# Patient Record
Sex: Male | Born: 1940
Health system: Southern US, Community
[De-identification: ages and names within clinical notes are randomized; demographics above are authoritative.]

## PROBLEM LIST (undated history)

## (undated) DIAGNOSIS — K5730 Diverticulosis of large intestine without perforation or abscess without bleeding: Secondary | ICD-10-CM

## (undated) DIAGNOSIS — R361 Hematospermia: Secondary | ICD-10-CM

## (undated) DIAGNOSIS — E785 Hyperlipidemia, unspecified: Secondary | ICD-10-CM

## (undated) DIAGNOSIS — Z86010 Personal history of colonic polyps: Secondary | ICD-10-CM

## (undated) DIAGNOSIS — R55 Syncope and collapse: Secondary | ICD-10-CM

## (undated) DIAGNOSIS — N5201 Erectile dysfunction due to arterial insufficiency: Secondary | ICD-10-CM

## (undated) DIAGNOSIS — I10 Essential (primary) hypertension: Secondary | ICD-10-CM

## (undated) HISTORY — DX: Diverticulosis of large intestine without perforation or abscess without bleeding: K57.30

## (undated) HISTORY — PX: COLONOSCOPY W/ POLYPECTOMY: SHX1380

## (undated) HISTORY — DX: Hyperlipidemia, unspecified: E78.5

## (undated) HISTORY — DX: Essential (primary) hypertension: I10

## (undated) HISTORY — DX: Hematospermia: R36.1

## (undated) HISTORY — DX: Erectile dysfunction due to arterial insufficiency: N52.01

## (undated) HISTORY — DX: Personal history of colonic polyps: Z86.010

## (undated) HISTORY — DX: Syncope and collapse: R55

---

## 1962-07-12 HISTORY — PX: MANDIBLE FRACTURE SURGERY: SHX706

## 1990-07-12 HISTORY — PX: APPENDECTOMY: SHX54

## 2000-07-12 DIAGNOSIS — R55 Syncope and collapse: Secondary | ICD-10-CM

## 2000-07-12 HISTORY — DX: Syncope and collapse: R55

## 2000-12-08 ENCOUNTER — Encounter: Payer: Self-pay | Admitting: Emergency Medicine

## 2000-12-08 ENCOUNTER — Emergency Department (HOSPITAL_COMMUNITY): Admission: EM | Admit: 2000-12-08 | Discharge: 2000-12-08 | Payer: Self-pay | Admitting: Emergency Medicine

## 2001-05-14 ENCOUNTER — Emergency Department (HOSPITAL_COMMUNITY): Admission: EM | Admit: 2001-05-14 | Discharge: 2001-05-14 | Payer: Self-pay

## 2002-12-02 ENCOUNTER — Emergency Department (HOSPITAL_COMMUNITY): Admission: EM | Admit: 2002-12-02 | Discharge: 2002-12-02 | Payer: Self-pay | Admitting: Emergency Medicine

## 2002-12-02 ENCOUNTER — Encounter: Payer: Self-pay | Admitting: Emergency Medicine

## 2004-01-15 ENCOUNTER — Encounter: Payer: Self-pay | Admitting: Internal Medicine

## 2004-07-30 ENCOUNTER — Ambulatory Visit: Payer: Self-pay | Admitting: Internal Medicine

## 2004-09-17 ENCOUNTER — Ambulatory Visit: Payer: Self-pay | Admitting: Internal Medicine

## 2004-09-24 ENCOUNTER — Ambulatory Visit: Payer: Self-pay | Admitting: Internal Medicine

## 2004-10-02 ENCOUNTER — Ambulatory Visit: Payer: Self-pay | Admitting: Internal Medicine

## 2004-12-11 ENCOUNTER — Ambulatory Visit: Payer: Self-pay | Admitting: Internal Medicine

## 2005-04-23 ENCOUNTER — Ambulatory Visit: Payer: Self-pay | Admitting: Internal Medicine

## 2005-04-30 ENCOUNTER — Ambulatory Visit: Payer: Self-pay | Admitting: Internal Medicine

## 2005-07-12 DIAGNOSIS — Z860100 Personal history of colon polyps, unspecified: Secondary | ICD-10-CM

## 2005-07-12 HISTORY — DX: Personal history of colonic polyps: Z86.010

## 2005-07-12 HISTORY — DX: Personal history of colon polyps, unspecified: Z86.0100

## 2005-10-22 ENCOUNTER — Ambulatory Visit: Payer: Self-pay | Admitting: Internal Medicine

## 2005-11-08 ENCOUNTER — Ambulatory Visit: Payer: Self-pay | Admitting: Internal Medicine

## 2005-12-01 ENCOUNTER — Ambulatory Visit: Payer: Self-pay | Admitting: Internal Medicine

## 2005-12-02 ENCOUNTER — Ambulatory Visit: Payer: Self-pay | Admitting: Internal Medicine

## 2005-12-02 ENCOUNTER — Ambulatory Visit: Payer: Self-pay | Admitting: Cardiology

## 2006-01-14 ENCOUNTER — Ambulatory Visit: Payer: Self-pay | Admitting: Internal Medicine

## 2006-01-21 ENCOUNTER — Encounter (INDEPENDENT_AMBULATORY_CARE_PROVIDER_SITE_OTHER): Payer: Self-pay | Admitting: *Deleted

## 2006-01-21 ENCOUNTER — Ambulatory Visit: Payer: Self-pay | Admitting: Internal Medicine

## 2006-03-10 ENCOUNTER — Ambulatory Visit: Payer: Self-pay | Admitting: Internal Medicine

## 2006-06-16 ENCOUNTER — Ambulatory Visit: Payer: Self-pay | Admitting: Internal Medicine

## 2006-07-21 ENCOUNTER — Ambulatory Visit: Payer: Self-pay | Admitting: Internal Medicine

## 2006-10-20 ENCOUNTER — Ambulatory Visit: Payer: Self-pay | Admitting: Internal Medicine

## 2006-10-24 ENCOUNTER — Ambulatory Visit: Payer: Self-pay | Admitting: Internal Medicine

## 2006-10-24 LAB — CONVERTED CEMR LAB
HDL: 37.2 mg/dL — ABNORMAL LOW (ref >39.0)
LDL Cholesterol: 118 mg/dL — ABNORMAL HIGH (ref 0–99)
Triglycerides: 176 mg/dL — ABNORMAL HIGH (ref 0–149)
VLDL: 35 mg/dL (ref 0–40)

## 2007-01-31 ENCOUNTER — Telehealth (INDEPENDENT_AMBULATORY_CARE_PROVIDER_SITE_OTHER): Payer: Self-pay | Admitting: *Deleted

## 2007-02-25 ENCOUNTER — Emergency Department (HOSPITAL_COMMUNITY): Admission: EM | Admit: 2007-02-25 | Discharge: 2007-02-25 | Payer: Self-pay | Admitting: Emergency Medicine

## 2007-04-17 ENCOUNTER — Ambulatory Visit: Payer: Self-pay | Admitting: Internal Medicine

## 2007-04-21 ENCOUNTER — Encounter (INDEPENDENT_AMBULATORY_CARE_PROVIDER_SITE_OTHER): Payer: Self-pay | Admitting: *Deleted

## 2007-04-21 LAB — CONVERTED CEMR LAB
Hgb A1c MFr Bld: 5.8 % (ref 4.6–6.0)
Total CHOL/HDL Ratio: 6.2
Triglycerides: 158 mg/dL — ABNORMAL HIGH (ref 0–149)
VLDL: 32 mg/dL (ref 0–40)

## 2007-05-01 ENCOUNTER — Ambulatory Visit: Payer: Self-pay | Admitting: Internal Medicine

## 2007-06-12 ENCOUNTER — Ambulatory Visit: Payer: Self-pay | Admitting: Internal Medicine

## 2007-06-12 DIAGNOSIS — M7730 Calcaneal spur, unspecified foot: Secondary | ICD-10-CM | POA: Insufficient documentation

## 2007-07-13 HISTORY — PX: INGUINAL HERNIA REPAIR: SUR1180

## 2007-10-30 ENCOUNTER — Ambulatory Visit: Payer: Self-pay | Admitting: Internal Medicine

## 2007-10-30 DIAGNOSIS — T887XXA Unspecified adverse effect of drug or medicament, initial encounter: Secondary | ICD-10-CM | POA: Insufficient documentation

## 2007-11-05 ENCOUNTER — Encounter (INDEPENDENT_AMBULATORY_CARE_PROVIDER_SITE_OTHER): Payer: Self-pay | Admitting: *Deleted

## 2007-11-05 LAB — CONVERTED CEMR LAB
Albumin: 4 g/dL (ref 3.5–5.2)
Cholesterol: 184 mg/dL (ref 0–200)
HDL: 36.1 mg/dL — ABNORMAL LOW (ref >39.0)
LDL Cholesterol: 108 mg/dL — ABNORMAL HIGH (ref 0–99)
Total Protein: 7.2 g/dL (ref 6.0–8.3)
Triglycerides: 199 mg/dL — ABNORMAL HIGH (ref 0–149)
VLDL: 40 mg/dL (ref 0–40)

## 2007-12-12 ENCOUNTER — Telehealth (INDEPENDENT_AMBULATORY_CARE_PROVIDER_SITE_OTHER): Payer: Self-pay | Admitting: *Deleted

## 2007-12-13 ENCOUNTER — Telehealth (INDEPENDENT_AMBULATORY_CARE_PROVIDER_SITE_OTHER): Payer: Self-pay | Admitting: *Deleted

## 2008-03-20 ENCOUNTER — Telehealth (INDEPENDENT_AMBULATORY_CARE_PROVIDER_SITE_OTHER): Payer: Self-pay | Admitting: *Deleted

## 2008-04-03 ENCOUNTER — Telehealth (INDEPENDENT_AMBULATORY_CARE_PROVIDER_SITE_OTHER): Payer: Self-pay | Admitting: *Deleted

## 2008-04-04 ENCOUNTER — Ambulatory Visit: Payer: Self-pay | Admitting: Internal Medicine

## 2008-04-04 DIAGNOSIS — Z8719 Personal history of other diseases of the digestive system: Secondary | ICD-10-CM

## 2008-04-04 DIAGNOSIS — I10 Essential (primary) hypertension: Secondary | ICD-10-CM | POA: Insufficient documentation

## 2008-04-04 DIAGNOSIS — R739 Hyperglycemia, unspecified: Secondary | ICD-10-CM | POA: Insufficient documentation

## 2008-04-08 ENCOUNTER — Encounter (INDEPENDENT_AMBULATORY_CARE_PROVIDER_SITE_OTHER): Payer: Self-pay | Admitting: *Deleted

## 2008-04-08 ENCOUNTER — Telehealth (INDEPENDENT_AMBULATORY_CARE_PROVIDER_SITE_OTHER): Payer: Self-pay | Admitting: *Deleted

## 2008-04-09 ENCOUNTER — Encounter (INDEPENDENT_AMBULATORY_CARE_PROVIDER_SITE_OTHER): Payer: Self-pay | Admitting: *Deleted

## 2008-04-15 ENCOUNTER — Ambulatory Visit: Payer: Self-pay | Admitting: Internal Medicine

## 2008-04-16 ENCOUNTER — Ambulatory Visit: Payer: Self-pay | Admitting: Internal Medicine

## 2008-04-23 ENCOUNTER — Encounter: Payer: Self-pay | Admitting: Internal Medicine

## 2008-04-23 ENCOUNTER — Encounter (INDEPENDENT_AMBULATORY_CARE_PROVIDER_SITE_OTHER): Payer: Self-pay | Admitting: *Deleted

## 2008-04-23 LAB — CONVERTED CEMR LAB
AST: 28 units/L (ref 0–37)
Cholesterol: 151 mg/dL (ref 0–200)
HDL: 36.5 mg/dL — ABNORMAL LOW (ref >39.0)
Hgb A1c MFr Bld: 5.6 % (ref 4.6–6.0)
LDL Cholesterol: 98 mg/dL (ref 0–99)
VLDL: 17 mg/dL (ref 0–40)

## 2008-05-10 ENCOUNTER — Telehealth (INDEPENDENT_AMBULATORY_CARE_PROVIDER_SITE_OTHER): Payer: Self-pay | Admitting: *Deleted

## 2008-05-16 ENCOUNTER — Ambulatory Visit (HOSPITAL_BASED_OUTPATIENT_CLINIC_OR_DEPARTMENT_OTHER): Admission: RE | Admit: 2008-05-16 | Discharge: 2008-05-16 | Payer: Self-pay | Admitting: General Surgery

## 2008-07-12 ENCOUNTER — Emergency Department (HOSPITAL_COMMUNITY): Admission: AC | Admit: 2008-07-12 | Discharge: 2008-07-12 | Payer: Self-pay

## 2008-07-12 ENCOUNTER — Telehealth: Payer: Self-pay | Admitting: Internal Medicine

## 2008-07-15 ENCOUNTER — Ambulatory Visit: Payer: Self-pay | Admitting: Internal Medicine

## 2008-08-28 ENCOUNTER — Ambulatory Visit: Payer: Self-pay | Admitting: Internal Medicine

## 2008-11-04 ENCOUNTER — Ambulatory Visit: Payer: Self-pay | Admitting: Internal Medicine

## 2008-11-04 DIAGNOSIS — M2550 Pain in unspecified joint: Secondary | ICD-10-CM

## 2009-04-15 ENCOUNTER — Ambulatory Visit: Payer: Self-pay | Admitting: Internal Medicine

## 2009-04-15 DIAGNOSIS — Z860100 Personal history of colon polyps, unspecified: Secondary | ICD-10-CM | POA: Insufficient documentation

## 2009-04-15 DIAGNOSIS — E785 Hyperlipidemia, unspecified: Secondary | ICD-10-CM

## 2009-04-15 IMAGING — CR DG CHEST 1V PORT
1 series · 1 of 1 positions shown · non-contrast
Comparison: None available

CLINICAL DATA: Fell

PORTABLE CHEST - 1 VIEW

[AP]
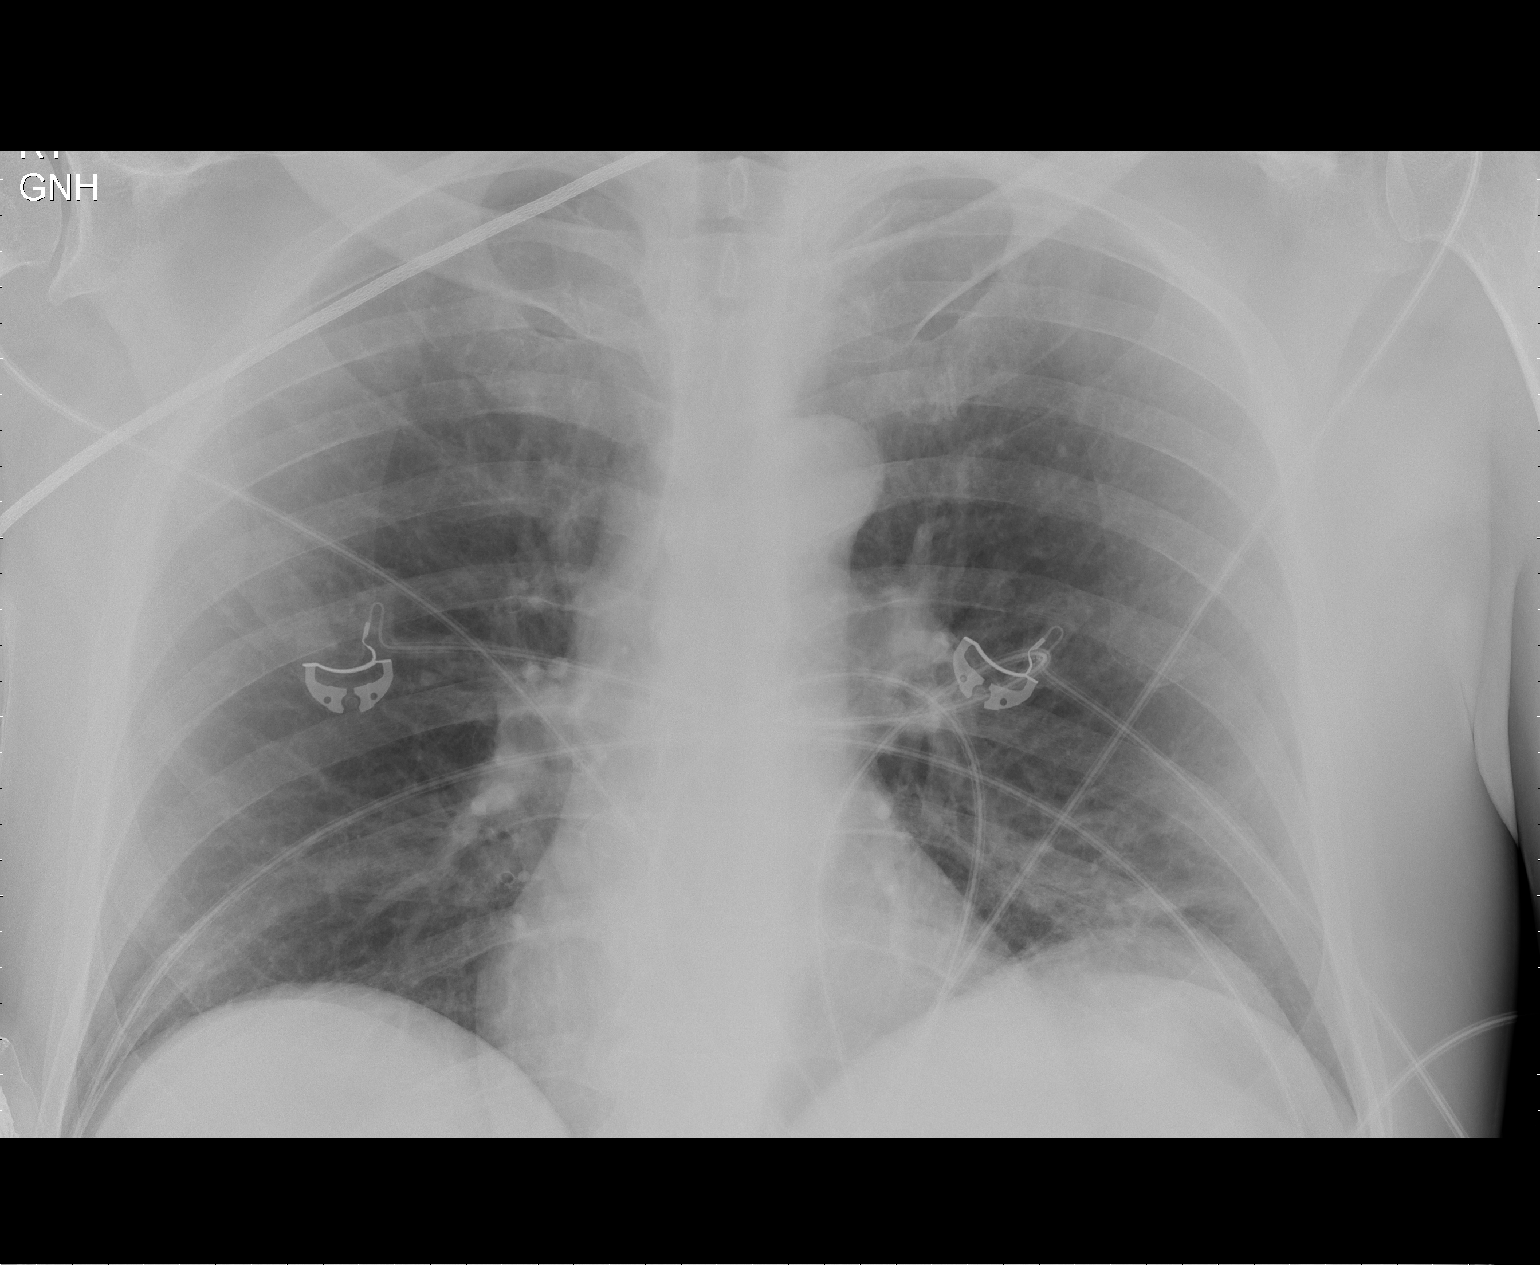

[1 of 1 positions shown; findings below may reference images not displayed]

FINDINGS: There is elevation of the left diaphragmatic leaflet.
There is some linear atelectasis or scarring at the left lung base.
Right lung clear.  No pneumothorax or effusion.  Mediastinal
contours normal.  Visualized bones unremarkable.
IMPRESSION: 1.  Mild elevation of the left diaphragmatic leaflet with some
linear atelectasis or scarring at the left lung base.
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
## 2009-04-21 ENCOUNTER — Ambulatory Visit: Payer: Self-pay | Admitting: Internal Medicine

## 2009-04-23 LAB — CONVERTED CEMR LAB
AST: 27 units/L (ref 0–37)
Albumin: 4.4 g/dL (ref 3.5–5.2)
Alkaline Phosphatase: 61 units/L (ref 39–117)
Basophils Absolute: 0 10*3/uL (ref 0.0–0.1)
Bilirubin, Direct: 0.1 mg/dL (ref 0.0–0.3)
Eosinophils Relative: 1.3 % (ref 0.0–5.0)
HDL: 34.2 mg/dL — ABNORMAL LOW (ref >39.00)
Hgb A1c MFr Bld: 5.7 % (ref 4.6–6.5)
Lymphocytes Relative: 28.3 % (ref 12.0–46.0)
Monocytes Relative: 6.9 % (ref 3.0–12.0)
PSA: 1.77 ng/mL (ref 0.10–4.00)
Platelets: 184 10*3/uL (ref 150.0–400.0)
Potassium: 4.4 meq/L (ref 3.5–5.1)
RDW: 12.2 % (ref 11.5–14.6)
TSH: 2.21 microintl units/mL (ref 0.35–5.50)
Total CHOL/HDL Ratio: 5
Total Protein: 7.2 g/dL (ref 6.0–8.3)
VLDL: 28.6 mg/dL (ref 0.0–40.0)
WBC: 5.7 10*3/uL (ref 4.5–10.5)

## 2009-04-24 ENCOUNTER — Encounter (INDEPENDENT_AMBULATORY_CARE_PROVIDER_SITE_OTHER): Payer: Self-pay | Admitting: *Deleted

## 2009-11-17 ENCOUNTER — Telehealth (INDEPENDENT_AMBULATORY_CARE_PROVIDER_SITE_OTHER): Payer: Self-pay | Admitting: *Deleted

## 2009-11-24 ENCOUNTER — Ambulatory Visit: Payer: Self-pay | Admitting: Internal Medicine

## 2010-01-19 ENCOUNTER — Ambulatory Visit: Payer: Self-pay | Admitting: Internal Medicine

## 2010-01-19 DIAGNOSIS — M545 Low back pain, unspecified: Secondary | ICD-10-CM | POA: Insufficient documentation

## 2010-01-19 LAB — CONVERTED CEMR LAB
Bilirubin Urine: NEGATIVE
Glucose, Urine, Semiquant: NEGATIVE
Ketones, urine, test strip: NEGATIVE
Specific Gravity, Urine: <1.005
pH: 6

## 2010-01-22 ENCOUNTER — Encounter: Payer: Self-pay | Admitting: Internal Medicine

## 2010-01-30 ENCOUNTER — Ambulatory Visit: Payer: Self-pay | Admitting: Internal Medicine

## 2010-01-30 DIAGNOSIS — E876 Hypokalemia: Secondary | ICD-10-CM

## 2010-02-02 ENCOUNTER — Telehealth (INDEPENDENT_AMBULATORY_CARE_PROVIDER_SITE_OTHER): Payer: Self-pay | Admitting: *Deleted

## 2010-02-02 LAB — CONVERTED CEMR LAB: Potassium: 4 meq/L (ref 3.5–5.3)

## 2010-03-26 ENCOUNTER — Telehealth (INDEPENDENT_AMBULATORY_CARE_PROVIDER_SITE_OTHER): Payer: Self-pay | Admitting: *Deleted

## 2010-05-11 ENCOUNTER — Ambulatory Visit: Payer: Self-pay | Admitting: Internal Medicine

## 2010-05-18 LAB — CONVERTED CEMR LAB
Albumin: 4 g/dL (ref 3.5–5.2)
Alkaline Phosphatase: 73 units/L (ref 39–117)
BUN: 14 mg/dL (ref 6–23)
Bilirubin, Direct: 0.1 mg/dL (ref 0.0–0.3)
Creatinine, Ser: 0.7 mg/dL (ref 0.4–1.5)
Hgb A1c MFr Bld: 5.7 % (ref 4.6–6.5)
Potassium: 4.1 meq/L (ref 3.5–5.1)
Total Bilirubin: 0.9 mg/dL (ref 0.3–1.2)
Total CHOL/HDL Ratio: 7
Triglycerides: 200 mg/dL — ABNORMAL HIGH (ref 0.0–149.0)

## 2010-05-21 ENCOUNTER — Ambulatory Visit: Payer: Self-pay | Admitting: Internal Medicine

## 2010-07-09 ENCOUNTER — Ambulatory Visit
Admission: RE | Admit: 2010-07-09 | Discharge: 2010-07-09 | Payer: Self-pay | Source: Home / Self Care | Attending: Internal Medicine | Admitting: Internal Medicine

## 2010-07-22 ENCOUNTER — Ambulatory Visit
Admission: RE | Admit: 2010-07-22 | Discharge: 2010-07-22 | Payer: Self-pay | Source: Home / Self Care | Attending: Internal Medicine | Admitting: Internal Medicine

## 2010-07-22 DIAGNOSIS — H6120 Impacted cerumen, unspecified ear: Secondary | ICD-10-CM | POA: Insufficient documentation

## 2010-07-22 DIAGNOSIS — J019 Acute sinusitis, unspecified: Secondary | ICD-10-CM | POA: Insufficient documentation

## 2010-07-22 DIAGNOSIS — H9190 Unspecified hearing loss, unspecified ear: Secondary | ICD-10-CM | POA: Insufficient documentation

## 2010-08-13 NOTE — Progress Notes (Signed)
Summary: Lab Results  Phone Note Outgoing Call Call back at Home Phone [phone]   Call placed by: Shonna Chock CMA,  February 02, 2010 4:36 PM Call placed to: Patient Summary of Call: Spoke with patient re lab results, patient ok'd all information (copy of labs mailed)  Excellent reports; stop K+ supplementation. Levester Fresh CMA  February 02, 2010 4:36 PM

## 2010-08-13 NOTE — Assessment & Plan Note (Signed)
Summary: hosp follow up/ pt bringing discharge papers/cbs   Vital Signs:  Patient profile:   70 year old male Weight:      238.6 pounds Temp:     97.9 degrees F oral Pulse rate:   76 / minute Resp:     18 per minute BP sitting:   118 / 86  (left arm) Cuff size:   large  Vitals Entered By: Shonna Chock CMA (January 30, 2010 2:11 PM) CC: Hospital follow-up: Patient was told potassium dropped low, patient was given 4 prescribed potassium and monitored over night then discharged.   CC:  Hospital follow-up: Patient was told potassium dropped low and patient was given 4 prescribed potassium and monitored over night then discharged.Marland Kitchen  History of Present Illness: Observed @  hospital in  [place] 07/13/2011for 24 hrs for HTN & hypokalemia . Since D/C BP not checked. The patient denies lightheadedness, urinary frequency, headaches, and edema.  Today he had   heart racing &  palpitations, the same symptoms prompting hospital visit.  The patient denies the following associated symptoms: chest pain, chest pressure, dyspnea, syncope, leg edema, and pedal edema.    Allergies (verified): No Known Drug Allergies  Physical Exam  General:  in no acute distress; alert,appropriate and cooperative throughout examination Lungs:  Normal respiratory effort, chest expands symmetrically. Lungs are clear to auscultation, no crackles or wheezes. Heart:  Normal rate and regular rhythm. S1 and S2 normal without gallop,click, rub .S4.grade 1 /6 systolic murmur.   Abdomen:  Bowel sounds positive,abdomen soft and non-tender without masses, organomegaly or hernias noted. No aortic or renal artery bruits Extremities:  No clubbing, cyanosis. Trace  edema.     Impression & Recommendations:  Problem # 1:  HYPERTENSION (ICD-401.9)  The following medications were removed from the medication list:    Losartan Potassium-hctz 100-12.5 Mg Tabs (Losartan potassium-hctz) .Marland Kitchen... 1 once daily His updated medication list for  this problem includes:    Amlodipine Besylate 5 Mg Tabs (Amlodipine besylate) .Marland Kitchen... 1 once daily    Losartan Potassium 100 Mg Tabs (Losartan potassium) .Marland Kitchen... 1 once daily  Orders: Venipuncture (62952) TLB-Creatinine, Blood (82565-CREA) TLB-Potassium (K+) (84132-K) TLB-BUN (Urea Nitrogen) (84520-BUN)  Problem # 2:  HYPOKALEMIA (ICD-276.8)  Orders: Venipuncture (84132) TLB-Potassium (K+) (84132-K)  Complete Medication List: 1)  Adult Aspirin Ec Low Strength 81 Mg Tbec (Aspirin) 2)  Simvastatin 40 Mg Tabs (Simvastatin) .... 1/2 3)  Multivitamin  .... Once daily 4)  Fish Oil  .... Once daily 5)  Amlodipine Besylate 5 Mg Tabs (Amlodipine besylate) .Marland Kitchen.. 1 once daily 6)  Voltaren 1 % Gel (Diclofenac sodium) .... Apply two times a day as needed 7)  Loratadine 10 Mg Tabs (Loratadine) .Marland Kitchen.. 1 onrhinitisce daily as needed 8)  Tramadol Hcl 50 Mg Tabs (Tramadol hcl) .Marland Kitchen.. 1 every 6 hrs as needed  for pain 9)  Potassium 99 Mg Tabs (Potassium) .Marland Kitchen.. 1-2 by mouth once daily 10)  Losartan Potassium 100 Mg Tabs (Losartan potassium) .Marland Kitchen.. 1 once daily  Patient Instructions: 1)  Check your Blood Pressure regularly. If it is above: 135/85 ON AVERAGE  you should make an appointment. Citrus fruits , bananas & No Salt  all have K+ Prescriptions: LOSARTAN POTASSIUM 100 MG TABS (LOSARTAN POTASSIUM) 1 once daily  #30 x 5   Entered and Authorized by:   Marga Melnick MD   Signed by:   Marga Melnick MD on 01/30/2010   Method used:   Faxed to .Marland KitchenMarland Kitchen  [facility]  [address] [phone][facility] (retail)       [address]       [place], [place]  [address]       Ph: [phone] or [phone]       Fax: [phone]   RxID:   [id number]   Appended Document: hosp follow up/ pt bringing discharge papers/cbs     Clinical Lists Changes  Orders: Added new Service order of Specimen Handling (40347) - Signed

## 2010-08-13 NOTE — Progress Notes (Signed)
Summary: QUESTION REGARDING SIMVASTATIN AND LABS  Phone Note Call from Patient Call back at Home Phone [phone]   Caller: Patient Summary of Call: PATIENT CALLED TO REPORT THAT HIS PHARMACY TOLD HIM THAT HIS MEDICATION SIMVASTATIN HAS REACTION TO ANOTHER MEDICATION, BUT HE DIDNT KNOW THE NAME OF THE OTHER ONE  SAYS HE HAS NOT TAKEN ANY SIMVASTATIN IN A WHILE--SAID HE KNOWS HE NEEDS LAB WORK IN OCT BUT HASNT MADE APPOINTMENT YET---DOES DR HOPPER WANT HIM TO COME IN EARLIER FOR BLOOD WORK SINCE HE HASNT TAKEN THE SIMVASTATIN FOR A WHILE OR IS OCTOBER STILL OK FOR LABS??    Initial call taken by: Jerolyn Shin,  March 26, 2010 2:54 PM  Follow-up for Phone Call        Dr.Hopper please advise  Follow-up by: Shonna Chock CMA,  March 26, 2010 3:00 PM  Additional Follow-up for Phone Call Additional follow up Details #1::        Oct still OK for fasting labs Additional Follow-up by: Marga Melnick MD,  March 26, 2010 5:06 PM    Additional Follow-up for Phone Call Additional follow up Details #2::    lmom oct ok for fasting lab .Marland KitchenOkey Regal [place]  March 27, 2010 11:08 AM

## 2010-08-13 NOTE — Letter (Signed)
Summary: [facility]  [facility]   Imported By: Lennie Odor 02/05/2010 09:24:20  _____________________________________________________________________  External Attachment:    Type:   Image     Comment:   External Document

## 2010-08-13 NOTE — Assessment & Plan Note (Signed)
Summary: ears clogged//lch   Vital Signs:  Patient profile:   70 year old male Weight:      234.8 pounds BMI:     30.26 Temp:     98.1 degrees F oral Pulse rate:   76 / minute Resp:     14 per minute BP sitting:   122 / 84  (left arm) Cuff size:   large  Vitals Entered By: Shonna Chock CMA (July 22, 2010 3:20 PM) CC: Ears clogged, both ears were irrigated (right worse) , Ear pain, URI symptoms   CC:  Ears clogged, both ears were irrigated (right worse) , Ear pain, and URI symptoms.  History of Present Illness:    Hearing loss for several months. The patient denies pain or  sensation of fullness and tinnitus. Also he has had nasal congestion, purulent nasal discharge, and minimally  productive cough for severa weeks, but denies sore throat.  The patient denies fever.  The patient denies headache.  The patient denies the following risk factors for Strep sinusitis: unilateral facial pain, tooth pain, and tender adenopathy.  Rx: Coricidin HP, Neti pot  Current Medications (verified): 1)  Adult Aspirin Ec Low Strength 81 Mg  Tbec (Aspirin) 2)  Multivitamin .... Once Daily 3)  Fish Oil .... Once Daily 4)  Amlodipine Besylate 5 Mg Tabs (Amlodipine Besylate) .Marland Kitchen.. 1 Once Daily 5)  Losartan Potassium 100 Mg Tabs (Losartan Potassium) .Marland Kitchen.. 1 Once Daily 6)  Pravastatin Sodium 20 Mg Tabs (Pravastatin Sodium) .Marland Kitchen.. 1 At Bedtime  Allergies (verified): No Known Drug Allergies  Physical Exam  General:  in no acute distress; alert,appropriate and cooperative throughout examination Ears:  External ear exam shows no significant lesions or deformities.  AFTER soaking & gavage otoscopic examination reveals clear canals, tympanic membranes are intact bilaterally without bulging, retraction, inflammation or discharge . Hearing is grossly normal bilaterally. Nose:  External nasal examination shows no deformity or inflammation. Nasal mucosa are pink and moist without lesions or exudates. Mouth:  Oral  mucosa and oropharynx without lesions or exudates.  partials; mild pharyngeal erythema.   Lungs:  Normal respiratory effort, chest expands symmetrically. Lungs are clear to auscultation, no crackles or wheezes. Cervical Nodes:  No lymphadenopathy noted Axillary Nodes:  No palpable lymphadenopathy   Impression & Recommendations:  Problem # 1:  HEARING LOSS, UNSPEC. (ICD-389.9) due to # 2  Problem # 2:  CERUMEN IMPACTION, BILATERAL (ICD-380.4)  Problem # 3:  SINUSITIS- ACUTE-NOS (ICD-461.9)  Orders: Prescription Created Electronically [phone])  His updated medication list for this problem includes:    Amoxicillin 500 Mg Caps (Amoxicillin) .Marland Kitchen... 1 three times a day  Complete Medication List: 1)  Adult Aspirin Ec Low Strength 81 Mg Tbec (Aspirin) 2)  Multivitamin  .... Once daily 3)  Fish Oil  .... Once daily 4)  Amlodipine Besylate 5 Mg Tabs (Amlodipine besylate) .Marland Kitchen.. 1 once daily 5)  Losartan Potassium 100 Mg Tabs (Losartan potassium) .Marland Kitchen.. 1 once daily 6)  Pravastatin Sodium 20 Mg Tabs (Pravastatin sodium) .Marland Kitchen.. 1 at bedtime 7)  Amoxicillin 500 Mg Caps (Amoxicillin) .Marland Kitchen.. 1 three times a day  Patient Instructions: 1)  Neti pot once daily- two times a day as needed . Drink as much  NON dairy fluid as you can tolerate for the next few days. Ear Hygiene protocol: 3 drops of mineral oil @ bedtime  & cover with cotton ball. In am fill ear with Hydrogen Peroxide for 15 min ; then shower & wick out a thin  washrag . NO Q tips ! Prescriptions: AMOXICILLIN 500 MG CAPS (AMOXICILLIN) 1 three times a day  #30 x 0   Entered and Authorized by:   Marga Melnick MD   Signed by:   Marga Melnick MD on 07/22/2010   Method used:   [facility] [phone][facility] (retail)       [address]       [place], [place]  [id number]       Ph: [phone] or [phone]       Fax: [phone]   RxID:   [id number]    Orders Added: 1)  Prescription  Created Electronically [G8553] 2)  Est. Patient Level III [95188]

## 2010-08-13 NOTE — Assessment & Plan Note (Signed)
Summary: high bp//lch   Vital Signs:  Patient profile:   70 year old male Weight:      239 pounds BMI:     30.80 Temp:     98.3 degrees F oral Pulse rate:   76 / minute Resp:     15 per minute BP sitting:   128 / 84  (left arm) Cuff size:   large  Vitals Entered By: Shonna Chock (Nov 24, 2009 3:35 PM) CC: High B/P concerns, machine at home giving high reading. Compared Cuffs: Patient's cuff-136/101, Hypertension Management Comments REVIEWED MED LIST, PATIENT AGREED DOSE AND INSTRUCTION CORRECT    CC:  High B/P concerns, machine at home giving high reading. Compared Cuffs: Patient's cuff-136/101, and Hypertension Management.  History of Present Illness: Mr. Krolak new BP cuff revealed HTN with BP 128/95. Today his cuff registered 136/101 &  our cuff  128/84 in LUE and  170/104 vs  130/98 in RUE. Recheck of LUE : 128/92 with our cuff & 148/95.  Hypertension History:      He denies headache, chest pain, palpitations, dyspnea with exertion, orthopnea, PND, peripheral edema, visual symptoms, neurologic problems, and syncope.        Positive major cardiovascular risk factors include male age 48 years old or older, hyperlipidemia, and hypertension.  Negative major cardiovascular risk factors include no history of diabetes, negative family history for ischemic heart disease, and non-tobacco-user status.        Further assessment for target organ damage reveals no history of ASHD, stroke/TIA, or peripheral vascular disease.     Allergies (verified): No Known Drug Allergies  Review of Systems Eyes:  Denies blurring, double vision, and vision loss-both eyes. CV:  Denies leg cramps with exertion. Neuro:  Denies numbness and tingling.  Physical Exam  General:  well-nourished,in no acute distress; alert,appropriate and cooperative throughout examination Lungs:  Normal respiratory effort, chest expands symmetrically. Lungs are clear to auscultation, no crackles or wheezes. Heart:  Normal  rate and regular rhythm. S1 and S2 normal without gallop, murmur, click, rub.S4 Pulses:  R and L carotid,radial,dorsalis pedis and posterior tibial pulses are full and equal bilaterally   Impression & Recommendations:  Problem # 1:  HYPERTENSION (ICD-401.9) Suboptimal accuracy of  home cuff His updated medication list for this problem includes:    Amlodipine Besylate 5 Mg Tabs (Amlodipine besylate) .Marland Kitchen... 1 once daily    Losartan Potassium-hctz 100-12.5 Mg Tabs (Losartan potassium-hctz) .Marland Kitchen... 1 once daily  Complete Medication List: 1)  Adult Aspirin Ec Low Strength 81 Mg Tbec (Aspirin) 2)  Simvastatin 40 Mg Tabs (Simvastatin) .Marland Kitchen.. 1 qhs 3)  Multivitamin  .... Once daily 4)  Fish Oil  .... Once daily 5)  Amlodipine Besylate 5 Mg Tabs (Amlodipine besylate) .Marland Kitchen.. 1 once daily 6)  Voltaren 1 % Gel (Diclofenac sodium) .... Apply two times a day as needed 7)  Tramadol Hcl 50 Mg Tabs (Tramadol hcl) .Marland Kitchen.. 1-2 at bedtime as needed knee pain 8)  Losartan Potassium-hctz 100-12.5 Mg Tabs (Losartan potassium-hctz) .Marland Kitchen.. 1 once daily  Hypertension Assessment/Plan:      The patient's hypertensive risk group is category B: At least one risk factor (excluding diabetes) with no target organ damage.  His calculated 10 year risk of coronary heart disease is 22 %.  Today's blood pressure is 128/84.    Patient Instructions: 1)  Check your Blood Pressure regularly. Your goal = AVERAGE  @ least < 140/90, preferably < 135/85 ON AVERAGE. Limit your Sodium (Salt) to  less than 4 grams a day (slightly less than 1 teaspoon) to prevent fluid retention, swelling, or worsening or symptoms.

## 2010-08-13 NOTE — Assessment & Plan Note (Signed)
Summary: shingles shot///sph  Nurse Visit   Allergies: No Known Drug Allergies  Immunizations Administered:  Zostavax # 1:    Vaccine Type: Zostavax    Site: left deltoid    Mfr: Merck    Dose: 0.5 ml    Route:     Given by: Kimberly Payne CMA (AAMA)    Exp. Date: 05/17/2011    Lot #: [id number]    VIS given: 04/23/05 given July 09, 2010.  Orders Added: 1)  Zoster (Shingles) Vaccine Live [90736] 2)  Admin 1st Vaccine [90471] 

## 2010-08-13 NOTE — Assessment & Plan Note (Signed)
Summary: lower back pain//kn   Vital Signs:  Patient profile:   70 year old male Weight:      240.8 pounds Temp:     98.6 degrees F oral Pulse rate:   80 / minute Resp:     16 per minute BP sitting:   138 / 88  (left arm) Cuff size:   large  Vitals Entered By: Shonna Chock (January 19, 2010 4:52 PM) CC: 1.) Lower back pain x 4 weeks-no known injury  2.) Sinuses and SOB, Back pain, URI symptoms Comments REVIEWED MED LIST, PATIENT AGREED DOSE AND INSTRUCTION CORRECT    CC:  1.) Lower back pain x 4 weeks-no known injury  2.) Sinuses and SOB, Back pain, and URI symptoms.  History of Present Illness:  Back Pain      This is a 70 year old man who presents with Back pain  as constant "soreness" for 4 weeks.  The patient reports rest pain, but denies fever, chills, weakness, loss of sensation, fecal incontinence, urinary incontinence, urinary retention, dysuria, and inability to care for self.  The pain is located in the mid low back.  The pain began at work and gradually.  The pain is made worse by inactivity, ie, sitting.  The pain is made better by activity(mobilization) and acetaminophen.  Risk factors for serious underlying conditions include duration of pain of  1 month and age >= 50 years.   URI Symptoms      The patient also presents with URI symptoms with PNDrainage > 4 weeks(present prior to LBP).  The patient reports clear nasal discharge, but denies nasal congestion, sore throat, productive cough, and earache.  The patient denies fever, dyspnea, wheezing, rash, vomiting, and diarrhea.  The patient also reports sneezing.  The patient denies itchy watery eyes, headache, muscle aches, and severe fatigue.  The patient denies the following risk factors for Strep sinusitis: unilateral facial pain, tooth pain, and tender adenopathy.  Rx: none  Allergies (verified): No Known Drug Allergies  Review of Systems GU:  Complains of erectile dysfunction; Viagra ? benefit.  Physical  Exam  General:  well-nourished,in no acute distress; alert,appropriate and cooperative throughout examination Ears:  External ear exam shows no significant lesions or deformities.  Otoscopic examination reveals clear canals, tympanic membranes are intact bilaterally without bulging, retraction, inflammation or discharge. Hearing is grossly normal bilaterally. Wax bilaterally Nose:  External nasal examination shows no deformity or inflammation. Nasal mucosa are pink and moist without lesions or exudates. Mouth:  Oral mucosa and oropharynx without lesions or exudates.  Upper & lower partial Lungs:  Normal respiratory effort, chest expands symmetrically. Lungs are clear to auscultation, no crackles or wheezes. Abdomen:  Bowel sounds positive,abdomen soft and non-tender without masses, organomegaly or hernias noted. No AAA Msk:  No deformity or scoliosis noted of thoracic or lumbar spine.  No tenderness to percussion over LS spine. Lay down & sat upw/o help Extremities:  No clubbing, cyanosis, edema, or deformity noted with normal full range of motion of all joints.  Neg SLR   Neurologic:  alert & oriented X3, strength normal in all extremities,  heel & toe gait normal, and DTRs symmetrical and normal.   Skin:  Intact without suspicious lesions or rashes Cervical Nodes:  No lymphadenopathy noted Axillary Nodes:  No palpable lymphadenopathy   Impression & Recommendations:  Problem # 1:  LOW BACK PAIN SYNDROME (ICD-724.2)  The following medications were removed from the medication list:    Tramadol Hcl  50 Mg Tabs (Tramadol hcl) .Marland Kitchen... 1-2 at bedtime as needed knee pain His updated medication list for this problem includes:    Adult Aspirin Ec Low Strength 81 Mg Tbec (Aspirin)    Tramadol Hcl 50 Mg Tabs (Tramadol hcl) .Marland Kitchen... 1 every 6 hrs as needed  for pain  Orders: T-Lumbar Spine 2 Views (72100TC)  Problem # 2:  RHINITIS (ICD-477.9)  His updated medication list for this problem includes:     Loratadine 10 Mg Tabs (Loratadine) .Marland Kitchen... 1 onrhinitisce daily as needed  Complete Medication List: 1)  Adult Aspirin Ec Low Strength 81 Mg Tbec (Aspirin) 2)  Simvastatin 40 Mg Tabs (Simvastatin) .Marland Kitchen.. 1 qhs 3)  Multivitamin  .... Once daily 4)  Fish Oil  .... Once daily 5)  Amlodipine Besylate 5 Mg Tabs (Amlodipine besylate) .Marland Kitchen.. 1 once daily 6)  Voltaren 1 % Gel (Diclofenac sodium) .... Apply two times a day as needed 7)  Losartan Potassium-hctz 100-12.5 Mg Tabs (Losartan potassium-hctz) .Marland Kitchen.. 1 once daily 8)  Loratadine 10 Mg Tabs (Loratadine) .Marland Kitchen.. 1 onrhinitisce daily as needed 9)  Tramadol Hcl 50 Mg Tabs (Tramadol hcl) .Marland Kitchen.. 1 every 6 hrs as needed  for pain  Other Orders: UA Dipstick w/o Micro (manual) (04540)  Patient Instructions: 1)  Xrays @ [address]Neti pot once daily as needed for drainage. Use samples of Cialis or Levitra as prescribed as trial. Prescriptions: TRAMADOL HCL 50 MG TABS (TRAMADOL HCL) 1 every 6 hrs as needed  for pain  #30 x 1   Entered and Authorized by:   Marga Melnick MD   Signed by:   Marga Melnick MD on 01/19/2010   Method used:   Print then Give to Patient   RxID:   [id number] LORATADINE 10 MG TABS (LORATADINE) 1 onrhinitisce daily as needed  #30 x 5   Entered and Authorized by:   Marga Melnick MD   Signed by:   Marga Melnick MD on 01/19/2010   Method used:   Print then Give to Patient   RxID:   [id number]   Laboratory Results   Urine Tests    Routine Urinalysis   Color: lt. yellow Appearance: Clear Glucose: negative   (Normal Range: Negative) Bilirubin: negative   (Normal Range: Negative) Ketone: negative   (Normal Range: Negative) Spec. Gravity: <1.005   (Normal Range: 1.003-1.035) Blood: negative   (Normal Range: Negative) pH: 6.0   (Normal Range: 5.0-8.0) Protein: negative   (Normal Range: Negative) Urobilinogen: 0.2   (Normal Range: 0-1) Nitrite: negative   (Normal Range: Negative) Leukocyte Esterace: negative    (Normal Range: Negative)

## 2010-08-13 NOTE — Assessment & Plan Note (Signed)
Summary: DISCUSS LAB/LIPIDS AND MEDS/KB   Vital Signs:  Patient profile:   70 year old male Weight:      240.4 pounds BMI:     30.98 Pulse rate:   64 / minute Resp:     14 per minute BP sitting:   116 / 70  (left arm) Cuff size:   large  Vitals Entered By: Shonna Chock CMA (May 21, 2010 4:36 PM) CC: Follow-up visit: discuss labs (copy given) , Lipid Management   CC:  Follow-up visit: discuss labs (copy given)  and Lipid Management.  History of Present Illness: Hyperlipidemia Follow-Up      He has been off lipids because of question of drug interactions.  The patient denies the following symptoms: chest pain/pressure, exercise intolerance, dypsnea, palpitations, syncope, and pedal edema.  Dietary compliance has been fair.  The patient reports exercising daily as walking on job.  Adjunctive measures currently used by the patient include ASA and fish oil supplements. Labs(including 2005 NMR ) & current labs)  reviewed & role of High Fructose Corn Syrup in raising Triglycerides discussed with him & his wife.  Lipid Management History:      Positive NCEP/ATP III risk factors include male age 61 years old or older, HDL cholesterol less than 40, and hypertension.  Negative NCEP/ATP III risk factors include non-diabetic, no family history for ischemic heart disease, non-tobacco-user status, no ASHD (atherosclerotic heart disease), no prior stroke/TIA, no peripheral vascular disease, and no history of aortic aneurysm.     Current Medications (verified): 1)  Adult Aspirin Ec Low Strength 81 Mg  Tbec (Aspirin) 2)  Multivitamin .... Once Daily 3)  Fish Oil .... Once Daily 4)  Amlodipine Besylate 5 Mg Tabs (Amlodipine Besylate) .Marland Kitchen.. 1 Once Daily 5)  Losartan Potassium 100 Mg Tabs (Losartan Potassium) .Marland Kitchen.. 1 Once Daily  Allergies (verified): No Known Drug Allergies  Past History:  Past Medical History: Diverticulitis, hx of Diverticulosis, colon Hyperlipidemia Hypertension:  NMR  Lipoprofile  2005: LDL 159( 2333/ 1648), HDL 25, TG 253.; Framingham Study LDL goal = < 130 Colonic polyps, hx of 2007 Syncope 2002 Skin  ? "pre cancer", hx of, Dr Danella Deis  Family History: Father: CHF, HTN Mother: Gyn cancer , HTN, [place] Siblings: bro diverticulitis ,partial colectomy; sister DM; no MI in FH  Review of Systems Derm:  Denies poor wound healing. Neuro:  Denies numbness and tingling. Endo:  Denies excessive hunger, excessive thirst, and excessive urination.  Physical Exam  General:  well-nourished; alert,appropriate and cooperative throughout examination Lungs:  Normal respiratory effort, chest expands symmetrically. Lungs are clear to auscultation, no crackles or wheezes. Heart:  normal rate, regular rhythm, no gallop, no rub, no JVD, no HJR, and grade 1 /6 systolic murmur.   Abdomen:  Bowel sounds positive,abdomen soft and non-tender without masses, organomegaly or hernias noted. Pulses:  R and L carotid,radial,dorsalis pedis and posterior tibial pulses are full and equal bilaterally Extremities:  No clubbing, cyanosis, edema Neurologic:  alert & oriented X3.   Skin:  Intact without suspicious lesions or rashes Psych:  memory intact for recent and remote, normally interactive, and good eye contact.   Focused    Impression & Recommendations:  Problem # 1:  HYPERLIPIDEMIA (ICD-272.4)  The following medications were removed from the medication list:    Simvastatin 40 Mg Tabs (Simvastatin) .Marland Kitchen... 1/2 His updated medication list for this problem includes:    Pravastatin Sodium 20 Mg Tabs (Pravastatin sodium) .Marland Kitchen... 1 at bedtime  Problem #  2:  HYPOKALEMIA (ICD-276.8) resolved  Problem # 3:  HYPERGLYCEMIA, BORDERLINE (ICD-790.29) A1c non diabetic  Problem # 4:  HYPERTENSION (ICD-401.9) controlled His updated medication list for this problem includes:    Amlodipine Besylate 5 Mg Tabs (Amlodipine besylate) .Marland Kitchen... 1 once daily    Losartan Potassium 100 Mg Tabs  (Losartan potassium) .Marland Kitchen... 1 once daily  Complete Medication List: 1)  Adult Aspirin Ec Low Strength 81 Mg Tbec (Aspirin) 2)  Multivitamin  .... Once daily 3)  Fish Oil  .... Once daily 4)  Amlodipine Besylate 5 Mg Tabs (Amlodipine besylate) .Marland Kitchen.. 1 once daily 5)  Losartan Potassium 100 Mg Tabs (Losartan potassium) .Marland Kitchen.. 1 once daily 6)  Pravastatin Sodium 20 Mg Tabs (Pravastatin sodium) .Marland Kitchen.. 1 at bedtime  Lipid Assessment/Plan:      Based on NCEP/ATP III, the patient's risk factor category is "2 or more risk factors and a calculated 10 year CAD risk of < 20%".  The patient's lipid goals are as follows: Total cholesterol goal is 200; LDL cholesterol goal is 130; HDL cholesterol goal is 40; Triglyceride goal is 150.  His LDL cholesterol goal has not been met.  Secondary causes for hyperlipidemia have been ruled out.  He has been counseled on adjunctive measures for lowering his cholesterol and has been provided with dietary instructions.    Patient Instructions: 1)  Consume LESS THAN 40 grams of HFCS sugar/ day as discussed. 2)  Please schedule a follow-up appointment in 4 months. 3)  Hepatic Panel prior to visit, ICD-9:995.20 4)  Lipid Panel prior to visit, ICD-9:272.4  5)  It is important that you exercise regularly at least 20 minutes 5 times a week. If you develop chest pain, have severe difficulty breathing, or feel very tired , stop exercising immediately and seek medical attention. 6)  Take an  81 mg coated Aspirin every day. Prescriptions: PRAVASTATIN SODIUM 20 MG TABS (PRAVASTATIN SODIUM) 1 at bedtime  #90 x 1   Entered and Authorized by:   Marga Melnick MD   Signed by:   Marga Melnick MD on 05/21/2010   Method used:   [facility]ed to ...       [facility]  [address] [phone]* (retail)       [address]       [place], [place]  [id number]       Ph: [phone] or [phone]       Fax: [phone]   RxID:   [id number]    Orders Added: 1)  Est.  Patient Level IV [40347]

## 2010-08-13 NOTE — Progress Notes (Signed)
Summary: Prior Auth NOT NEEDED LOSARTAN  Phone Note Refill Request Call back at [phone] Message from:  Pharmacy on Nov 17, 2009 1:29 PM  Refills Requested: Medication #1:  LOSARTAN POTASSIUM-HCTZ 100-12.5 MG TABS 1 once daily.   Dosage confirmed as above?Dosage Confirmed [facility] [address]   Initial call taken by: Harold Barban,  Nov 17, 2009 1:29 PM  Follow-up for Phone Call        Per [facility] pt is only allow #30 per 30 day period. If pt is requesting 3 month supply pt  must use mail order. pt aware and is fine with 30 day supply. pharmacy notified and new rx sent in to pharmacy...................Marland KitchenFelecia Deloach CMA  Nov 17, 2009 3:10 PM     Prescriptions: LOSARTAN POTASSIUM-HCTZ 100-12.5 MG TABS (LOSARTAN POTASSIUM-HCTZ) 1 once daily  #30 x 6   Entered by:   Jeremy Johann CMA   Authorized by:   Marga Melnick MD   Signed by:   Jeremy Johann CMA on 11/17/2009   Method used:   [facility]ed to ...       [facility]  [address] [phone]* (retail)       [address]       [place], [place]  [address]       Ph: [phone] or [phone]       Fax: [phone]   RxID:   [id number]

## 2010-09-28 ENCOUNTER — Encounter: Payer: Self-pay | Admitting: *Deleted

## 2010-09-28 ENCOUNTER — Other Ambulatory Visit: Payer: Self-pay | Admitting: Internal Medicine

## 2010-09-28 ENCOUNTER — Other Ambulatory Visit (INDEPENDENT_AMBULATORY_CARE_PROVIDER_SITE_OTHER): Payer: 59

## 2010-09-28 DIAGNOSIS — E785 Hyperlipidemia, unspecified: Secondary | ICD-10-CM

## 2010-09-28 DIAGNOSIS — T887XXA Unspecified adverse effect of drug or medicament, initial encounter: Secondary | ICD-10-CM

## 2010-09-28 LAB — LIPID PANEL
Cholesterol: 215 mg/dL — ABNORMAL HIGH (ref 0–200)
HDL: 41.5 mg/dL (ref >39.00)
Total CHOL/HDL Ratio: 5
Triglycerides: 141 mg/dL (ref 0.0–149.0)

## 2010-09-28 LAB — HEPATIC FUNCTION PANEL
ALT: 28 U/L (ref 0–53)
Total Bilirubin: 0.7 mg/dL (ref 0.3–1.2)

## 2010-10-10 ENCOUNTER — Encounter: Payer: Self-pay | Admitting: Internal Medicine

## 2010-10-12 ENCOUNTER — Ambulatory Visit (INDEPENDENT_AMBULATORY_CARE_PROVIDER_SITE_OTHER): Payer: 59 | Admitting: Internal Medicine

## 2010-10-12 ENCOUNTER — Encounter: Payer: Self-pay | Admitting: Internal Medicine

## 2010-10-12 VITALS — BP 128/84 | HR 80 | Temp 98.3°F | Ht 74.5 in | Wt 229.2 lb

## 2010-10-12 DIAGNOSIS — E785 Hyperlipidemia, unspecified: Secondary | ICD-10-CM

## 2010-10-12 MED ORDER — PRAVASTATIN SODIUM 20 MG PO TABS: 30.0000 mg | ORAL_TABLET | Freq: Every day | ORAL | Status: DC

## 2010-10-12 NOTE — Progress Notes (Signed)
  Subjective:    Patient ID: Nicholas Burgess, male    DOB: Nov 19, 1940, 70 y.o.   MRN: [id number]  HPI He is here to review his labs. There's been a dramatic improvement in the lipid profile. Specifically his triglycerides have dropped from 253 to 141. He attributes this to stopping the cola beverages and increased intake of sweets. He is very knowledgeable about high fructose  corn syrup sugar and its impact on triglycerides.  He is only on pravastatin 20 mg daily. Based on his advanced cholesterol panel (NMR lipoprotein) his LDL should be less than 100. It is presently  130.    Review of Systems he denies excess fatigue; chest pain; palpitations; dyspnea; syncope; or edema.  He does have some low back pain intermittently. Occasionally he'll have nocturnal leg cramps. He denies any significant myalgias.     Objective:   Physical Exam Heart:  Normal rate and regular rhythm. S1 and S2 normal without gallop, murmur, click, rub or other extra sounds.                                                                                                      Lungs:Chest clear to auscultation; no wheezes, rhonchi,rales ,or rubs present.No increased work of breathing. All pulses are intact.        Assessment & Plan:  #1 dyslipidemia with marked improvement with nutritional changes. LDL is still above goal of less than 100.  Plan: The pravastatin 20 mg will be changed to one and a half at bedtime for a total dose of 30 mg. I would recommend repeating the fasting lipids in 4 months.

## 2010-10-12 NOTE — Assessment & Plan Note (Signed)
NMR Lipoprofile 2005: LDL 159( 2333/1648), HDL 25,TG 253.LDL goal = < 100

## 2010-10-12 NOTE — Patient Instructions (Signed)
Increase her pravastatin to 20 mg 1-1/2 pills at bedtime. Recheck fasting lipids with hepatic panel in 4 months (272.4, 995.2).

## 2010-10-26 LAB — URINALYSIS, ROUTINE W REFLEX MICROSCOPIC
Bilirubin Urine: NEGATIVE
Hgb urine dipstick: NEGATIVE
Specific Gravity, Urine: 1.015 (ref 1.005–1.030)
Urobilinogen, UA: 1 mg/dL (ref 0.0–1.0)

## 2010-10-26 LAB — TYPE AND SCREEN
ABO/RH(D): A POS
Antibody Screen: NEGATIVE

## 2010-10-26 LAB — COMPREHENSIVE METABOLIC PANEL
ALT: 24 U/L (ref 0–53)
Albumin: 3.7 g/dL (ref 3.5–5.2)
Alkaline Phosphatase: 52 U/L (ref 39–117)
GFR calc Af Amer: >60 mL/min (ref >60)
Potassium: 3.4 meq/L — ABNORMAL LOW (ref 3.5–5.1)
Sodium: 138 meq/L (ref 135–145)
Total Protein: 6.5 g/dL (ref 6.0–8.3)

## 2010-10-26 LAB — DIFFERENTIAL
Basophils Relative: 0 % (ref 0–1)
Eosinophils Absolute: 0.1 10*3/uL (ref 0.0–0.7)
Monocytes Absolute: 0.4 10*3/uL (ref 0.1–1.0)
Monocytes Relative: 7 % (ref 3–12)
Neutro Abs: 4.1 10*3/uL (ref 1.7–7.7)

## 2010-10-26 LAB — ABO/RH: ABO/RH(D): A POS

## 2010-10-26 LAB — CBC
Platelets: 160 10*3/uL (ref 150–400)
RDW: 12.8 % (ref 11.5–15.5)

## 2010-11-24 NOTE — Op Note (Signed)
Nicholas Burgess, Nicholas Burgess                ACCOUNT NO.:  [id number]   MEDICAL RECORD NO.:  [id number]          PATIENT TYPE:  AMB   LOCATION:  [facility]SC                          FACILITY:  [facility]   PHYSICIAN:  Cherylynn Ridges, M.D.    DATE OF BIRTH:  [birth year]   DATE OF PROCEDURE:  DATE OF DISCHARGE:                               OPERATIVE REPORT   PREOPERATIVE DIAGNOSIS:  Right inguinal hernia.   POSTOPERATIVE DIAGNOSIS:  Right direct and indirect inguinal hernia.   PROCEDURE:  Repair of right inguinal hernia with mesh.   SURGEON:  Cherylynn Ridges, MD   ANESTHESIA:  General with a laryngeal airway.  He was anesthetized with  20 mL of 0.5% Marcaine at the end of the case.   ESTIMATED BLOOD LOSS:  Less than 20 mL.   COMPLICATIONS:  None.   CONDITION:  Stable.   FINDINGS:  The patient had a large indirect sac along with direct hernia  of the floor.   INDICATIONS FOR OPERATION:  The patient is a 70 year old with a recently  noted symptomatic right inguinal hernia who comes in for repair.   OPERATION:  The patient was taken to the operating room and placed on  the table in supine position.  After an adequate general endotracheal  anesthetic was administered, he was prepped and draped in the usual  sterile manner exposing the right inguinal area.   A right transverse curvilinear incision was made about 6 cm long down  into the subcutaneous tissue.  A medial subcutaneous vein was taken with  hemostat clamps and ligated.  We dissected down to the external oblique  fascia which opened up into the superficial ring.  We made an incision  along the line of the fibers of the external oblique through the  superficial ring and then up towards the internal ring.  We then clamped  the edges of the fascia with the hemostat clamps and mobilized the  spermatic cord at the pubic tubercle.   It was noted upon mobilization of the spermatic cord that there was a  sac along with the cord and also weakness or  bulge in the direct floor.  We placed the spermatic cord up on a workbench between two hemostat  clamps and a Penrose drain.  We then dissected away the indirect sac  from the spermatic cord and dissected down to the internal ring where we  suture ligated it x2 with 0 Ethibond suture.  We resected the excess  sac.  Once this was done, we mobilized the spermatic cord  inferolaterally and then we imbricated the direct defects upon itself  using interrupted 0 Ethibond sutures to repair the floor using an oval  piece of polypropylene mesh measuring approximately 6 x 3 cm in size  attaching it to the pubic tubercle medially and then the conjoined  tendon anteromedially and the reflected portion of the anal ligament  inferolaterally.   The mesh was sutured in place using 0 Prolene suture.  Once this was  done, we irrigated with antibiotic solution which the mesh had been  soaked prior  to implantation.  We closed the external oblique fascia on  top of the spermatic cord in the canal using a running 3-0 Vicryl  suture.  The ilioinguinal nerve was noted and preserved and not  entrapped in the repair.  We irrigated again with antibiotic solution  and then closed the Scarpa fascia using interrupted 3-0 Vicryl suture.  The skin was injected with 0.50% Marcaine without epinephrine as noted  before, 20 mL were used.  We then closed the skin using running  subcuticular stitch of 4-0 Monocryl.  Dermabond, Steri-Strips and  Tegaderm were applied for the final dressing.  All counts were correct  including needles, sponges and instruments.      Cherylynn Ridges, M.D.  Electronically Signed     JOW/MEDQ  D:  05/16/2008  T:  05/17/2008  Job:  [id number]   cc:   Dr. Alwyn Ren

## 2010-11-26 ENCOUNTER — Telehealth: Payer: Self-pay | Admitting: Internal Medicine

## 2010-11-26 MED ORDER — AMLODIPINE BESYLATE 5 MG PO TABS: 5.0000 mg | ORAL_TABLET | Freq: Every day | ORAL | Status: DC

## 2010-11-26 NOTE — Telephone Encounter (Signed)
No documentation of anything received from pharmacy, rx sent to pharmacy now

## 2010-11-26 NOTE — Telephone Encounter (Signed)
Patient says he called prescription in four days ago for Amlodipine=30 day---pharmacy hasnt heard from us--pat is out of meds   [facility] [address], [address]

## 2010-12-23 ENCOUNTER — Other Ambulatory Visit: Payer: Self-pay | Admitting: Internal Medicine

## 2010-12-23 MED ORDER — LOSARTAN POTASSIUM 100 MG PO TABS: 100.0000 mg | ORAL_TABLET | Freq: Every day | ORAL | Status: DC

## 2010-12-23 NOTE — Telephone Encounter (Signed)
RX sent to pharmacy  

## 2011-02-13 ENCOUNTER — Encounter: Payer: Self-pay | Admitting: Internal Medicine

## 2011-02-22 ENCOUNTER — Telehealth: Payer: Self-pay | Admitting: Internal Medicine

## 2011-02-22 NOTE — Telephone Encounter (Signed)
To cover testosterone level , diagnosis of decreased libido, erectile dysfunction or muscle weakness required by insurance

## 2011-02-22 NOTE — Telephone Encounter (Signed)
Pt called has pending appt for CPX on 03/26/11 would like to have labs done next week would also like to have testosterone checked at this time as well.

## 2011-02-23 NOTE — Telephone Encounter (Signed)
Patient returned call--pls call him back

## 2011-02-23 NOTE — Telephone Encounter (Signed)
Left msg w/ wife to have pt return call

## 2011-02-24 NOTE — Telephone Encounter (Signed)
Spoke w/ pt states that he doesn't have any of the criteria that would meet need for testosterone test.

## 2011-02-26 ENCOUNTER — Other Ambulatory Visit: Payer: Self-pay | Admitting: Internal Medicine

## 2011-02-26 DIAGNOSIS — Z0000 Encounter for general adult medical examination without abnormal findings: Secondary | ICD-10-CM

## 2011-03-01 ENCOUNTER — Other Ambulatory Visit (INDEPENDENT_AMBULATORY_CARE_PROVIDER_SITE_OTHER): Payer: 59

## 2011-03-01 DIAGNOSIS — Z0000 Encounter for general adult medical examination without abnormal findings: Secondary | ICD-10-CM

## 2011-03-01 LAB — LIPID PANEL
Cholesterol: 198 mg/dL (ref 0–200)
HDL: 44.2 mg/dL (ref >39.00)
Triglycerides: 97 mg/dL (ref 0.0–149.0)

## 2011-03-01 LAB — HEPATIC FUNCTION PANEL
Albumin: 4.4 g/dL (ref 3.5–5.2)
Alkaline Phosphatase: 68 U/L (ref 39–117)

## 2011-03-01 LAB — CBC WITH DIFFERENTIAL/PLATELET
Basophils Absolute: 0 10*3/uL (ref 0.0–0.1)
Eosinophils Absolute: 0 10*3/uL (ref 0.0–0.7)
Hemoglobin: 15.4 g/dL (ref 13.0–17.0)
Lymphocytes Relative: 31.6 % (ref 12.0–46.0)
MCHC: 33.8 g/dL (ref 30.0–36.0)
Neutro Abs: 3.7 10*3/uL (ref 1.4–7.7)
RDW: 12.8 % (ref 11.5–14.6)

## 2011-03-01 LAB — BASIC METABOLIC PANEL
CO2: 28 mEq/L (ref 19–32)
Calcium: 9.3 mg/dL (ref 8.4–10.5)
Creatinine, Ser: 0.8 mg/dL (ref 0.4–1.5)
Glucose, Bld: 110 mg/dL — ABNORMAL HIGH (ref 70–99)
Sodium: 140 mEq/L (ref 135–145)

## 2011-03-01 LAB — TSH: TSH: 2.37 u[IU]/mL (ref 0.35–5.50)

## 2011-03-01 NOTE — Progress Notes (Signed)
Labs only

## 2011-03-18 ENCOUNTER — Other Ambulatory Visit: Payer: Self-pay | Admitting: Internal Medicine

## 2011-03-25 ENCOUNTER — Encounter: Payer: Self-pay | Admitting: Internal Medicine

## 2011-03-26 ENCOUNTER — Encounter: Payer: 59 | Admitting: Internal Medicine

## 2011-04-01 ENCOUNTER — Ambulatory Visit (AMBULATORY_SURGERY_CENTER): Payer: 59 | Admitting: *Deleted

## 2011-04-01 ENCOUNTER — Encounter: Payer: Self-pay | Admitting: Internal Medicine

## 2011-04-01 VITALS — Ht 74.0 in | Wt 224.4 lb

## 2011-04-01 DIAGNOSIS — Z1211 Encounter for screening for malignant neoplasm of colon: Secondary | ICD-10-CM

## 2011-04-01 MED ORDER — PEG-KCL-NACL-NASULF-NA ASC-C 100 G PO SOLR: ORAL | Status: DC

## 2011-04-13 LAB — CBC
HCT: 41.4
Platelets: 184
RDW: 12.8

## 2011-04-13 LAB — BASIC METABOLIC PANEL
BUN: 14
Calcium: 9.3
GFR calc non Af Amer: >60
Glucose, Bld: 124 — ABNORMAL HIGH
Potassium: 3.8

## 2011-04-13 LAB — DIFFERENTIAL
Basophils Absolute: 0
Basophils Relative: 0
Eosinophils Relative: 2
Lymphocytes Relative: 29

## 2011-04-15 ENCOUNTER — Encounter: Payer: Self-pay | Admitting: Internal Medicine

## 2011-04-15 ENCOUNTER — Ambulatory Visit (AMBULATORY_SURGERY_CENTER): Payer: 59 | Admitting: Internal Medicine

## 2011-04-15 VITALS — BP 164/100 | HR 67 | Temp 97.8°F | Resp 16 | Ht 74.0 in | Wt 224.0 lb

## 2011-04-15 DIAGNOSIS — D126 Benign neoplasm of colon, unspecified: Secondary | ICD-10-CM

## 2011-04-15 DIAGNOSIS — K5730 Diverticulosis of large intestine without perforation or abscess without bleeding: Secondary | ICD-10-CM

## 2011-04-15 DIAGNOSIS — Z86010 Personal history of colonic polyps: Secondary | ICD-10-CM

## 2011-04-15 DIAGNOSIS — Z1211 Encounter for screening for malignant neoplasm of colon: Secondary | ICD-10-CM

## 2011-04-15 MED ORDER — SODIUM CHLORIDE 0.9 % IV SOLN: 500.0000 mL | INTRAVENOUS | Status: DC

## 2011-04-15 NOTE — Patient Instructions (Signed)
Please, read the handouts given to you by your recovery room nurse.    We will send you polyp results within two weeks.   You need to increase the fiber in your diet due to your severe diverticulosis throughout your colon.  This will help to prevent diverticulitis.   You may resume your routine medications today.   If you have any questions, please call us at [phone].  Thank-you for choosing [place] today for your healthcare needs.

## 2011-04-16 ENCOUNTER — Telehealth: Payer: Self-pay

## 2011-04-16 NOTE — Telephone Encounter (Signed)

## 2011-04-23 LAB — POCT CARDIAC MARKERS
Myoglobin, poc: 90
Troponin i, poc: <0.05

## 2011-04-23 LAB — I-STAT 8, (EC8 V) (CONVERTED LAB)
BUN: 13
Chloride: 105
HCT: 43
Hemoglobin: 14.6
Operator id: 196461
Sodium: 140

## 2011-05-26 ENCOUNTER — Encounter: Payer: Self-pay | Admitting: Internal Medicine

## 2011-05-27 ENCOUNTER — Other Ambulatory Visit: Payer: Self-pay | Admitting: Internal Medicine

## 2011-05-27 ENCOUNTER — Encounter: Payer: Self-pay | Admitting: Internal Medicine

## 2011-05-27 ENCOUNTER — Ambulatory Visit (INDEPENDENT_AMBULATORY_CARE_PROVIDER_SITE_OTHER): Payer: 59 | Admitting: Internal Medicine

## 2011-05-27 VITALS — BP 122/80 | HR 71 | Temp 98.0°F | Resp 14 | Ht 75.0 in | Wt 225.0 lb

## 2011-05-27 DIAGNOSIS — Z0000 Encounter for general adult medical examination without abnormal findings: Secondary | ICD-10-CM

## 2011-05-27 DIAGNOSIS — R7309 Other abnormal glucose: Secondary | ICD-10-CM

## 2011-05-27 DIAGNOSIS — I10 Essential (primary) hypertension: Secondary | ICD-10-CM

## 2011-05-27 DIAGNOSIS — E785 Hyperlipidemia, unspecified: Secondary | ICD-10-CM

## 2011-05-27 MED ORDER — SILDENAFIL CITRATE 100 MG PO TABS: 100.0000 mg | ORAL_TABLET | ORAL | Status: DC | PRN

## 2011-05-27 MED ORDER — SIMVASTATIN 20 MG PO TABS: 20.0000 mg | ORAL_TABLET | Freq: Every day | ORAL | Status: DC

## 2011-05-27 NOTE — Patient Instructions (Signed)
Preventive Health Care: Exercise at least 30-45 minutes a day,  3-4 days a week.  Eat a low-fat diet with lots of fruits and vegetables, up to 7-9 servings per day. Consume less than 40 grams of sugar per day from foods & drinks with High Fructose Corn Sugar as #1, 2,3 or # 4 on label. Please review Dr Gildardo Griffes book Eat, Drink & Be Healthy for dietary cholesterol information.  Please  schedule fasting Labs : NMR Lipoprofile Lipid Panel, hepatic panel  & A1c  after 4 months of dietary changes & Simvastatin  to optimally assess LDL risk.   Please bring these instructions to that Lab appt.

## 2011-05-27 NOTE — Progress Notes (Signed)
Subjective:    Patient ID: Nicholas Burgess, male    DOB: 1940/07/23, 70 y.o.   MRN: [id number]  HPI  Nicholas Burgess is here for a physical; he denies acute issues.     Review of Systems HYPERTENSION: Disease Monitoring: Blood pressure range-not monitored Chest pain, palpitations- no       Dyspnea- only working in heat Medications: Compliance- yes  Lightheadedness,Syncope- no    Edema- no  FASTING HYPERGLYCEMIA, PMH of: Polyuria/phagia/dipsia- no       Visual problems- no Diet: low sweet  HYPERLIPIDEMIA: Medications: Compliance- yes  Abd pain, bowel changes- no   Muscle aches- no          Objective:   Physical Exam Gen.: Healthy and well-nourished in appearance. Alert, appropriate and cooperative throughout exam. Head: Normocephalic without obvious abnormalities;  No alopecia  Eyes: No corneal or conjunctival inflammation noted. Pupils equal round reactive to light and accommodation. Fundal exam is benign without hemorrhages, exudate, papilledema. Extraocular motion intact but OS deviates laterally with accomodation. Ptosis OS. Vision grossly normal with lenses. Ears: External  ear exam reveals no significant lesions or deformities. Canals: some wax , L > R. Hearing is grossly decreased on L. Nose: External nasal exam reveals no deformity or inflammation. Nasal mucosa are pink and moist. No lesions or exudates noted.  Mouth: Oral mucosa and oropharynx reveal no lesions or exudates. Teeth in good repair. Neck: No deformities, masses, or tenderness noted. Range of motion slightly decreased. Thyroid normal. Lungs: Normal respiratory effort; chest expands symmetrically. Lungs are clear to auscultation without rales, wheezes, or increased work of breathing. Heart: Normal rate and rhythm. Normal S1 and S2. No gallop, click, or rub. S 4 w/o  murmur. Abdomen: Bowel sounds normal; abdomen soft and nontender. No masses, organomegaly or hernias noted. Genitalia/ DRE: Varicoele present on  the left. Digital rectal exam deferred; he had a colonoscopy last month   .                                                                                   Musculoskeletal/extremities: No deformity or scoliosis noted of  the thoracic or lumbar spine. No clubbing, cyanosis, edema, or deformity noted. Range of motion  normal .Tone & strength  normal.Joints normal. Nail health  good. Vascular: Carotid, radial artery, dorsalis pedis and  posterior tibial pulses are full and equal. No bruits present. Neurologic: Alert and oriented x3. Deep tendon reflexes symmetrical and normal.          Skin: Intact without suspicious lesions or rashes. Lymph: No cervical, axillary, or inguinal lymphadenopathy present. Psych: Mood and affect are normal. Normally interactive                                                                                        Assessment & Plan:  #  1 comprehensive physical exam; no acute findings #2 see Problem List with Assessments & Recommendations Plan: see Orders   Note: The computer is interpreting a small Q wave in leads V3 through the 6 as an old anterior infarction. This is a  minor nonspecific change &  has been present before. There are no ischemic changes. This is a normal EKG.

## 2011-11-08 ENCOUNTER — Other Ambulatory Visit (INDEPENDENT_AMBULATORY_CARE_PROVIDER_SITE_OTHER): Payer: 59

## 2011-11-08 DIAGNOSIS — R7309 Other abnormal glucose: Secondary | ICD-10-CM

## 2011-11-08 DIAGNOSIS — E785 Hyperlipidemia, unspecified: Secondary | ICD-10-CM

## 2011-11-08 DIAGNOSIS — I10 Essential (primary) hypertension: Secondary | ICD-10-CM

## 2011-11-08 LAB — HEPATIC FUNCTION PANEL
ALT: 24 U/L (ref 0–53)
Bilirubin, Direct: 0.1 mg/dL (ref 0.0–0.3)
Total Protein: 7.3 g/dL (ref 6.0–8.3)

## 2011-11-08 LAB — LIPID PANEL
Cholesterol: 178 mg/dL (ref 0–200)
HDL: 45.8 mg/dL (ref >39.00)
VLDL: 21.6 mg/dL (ref 0.0–40.0)

## 2011-11-08 NOTE — Progress Notes (Signed)
Lab only 

## 2011-11-10 LAB — NMR LIPOPROFILE WITH LIPIDS
LDL (calc): 110 mg/dL — ABNORMAL HIGH (ref <100)
LDL Particle Number: 1686 nmol/L — ABNORMAL HIGH (ref <1000)
LP-IR Score: 55 — ABNORMAL HIGH (ref <=45)

## 2011-11-18 ENCOUNTER — Other Ambulatory Visit: Payer: Self-pay | Admitting: Internal Medicine

## 2011-11-18 MED ORDER — LOSARTAN POTASSIUM 100 MG PO TABS: 100.0000 mg | ORAL_TABLET | Freq: Every day | ORAL | Status: DC

## 2011-11-18 NOTE — Telephone Encounter (Signed)
refill for Losartan Potassium 100 MG Tab Qty 30 Take one tablet by mouth daily Last filled 4.12.13  Last OV 11.15.12

## 2011-11-18 NOTE — Telephone Encounter (Signed)
Rx sent 

## 2011-12-02 ENCOUNTER — Telehealth: Payer: Self-pay | Admitting: *Deleted

## 2011-12-02 NOTE — Telephone Encounter (Signed)
Spoke with patient, patient aware of results. Copy to be mailed along with this phone note

## 2011-12-02 NOTE — Telephone Encounter (Signed)
Pt is calling requesting lab results from 11-08-11.Please advise

## 2011-12-02 NOTE — Telephone Encounter (Signed)
Thank you for alerting [place] that you did not receive your lab results; I apologize for that. The reports with recommendation follow below: Risk of premature heart attack or stroke increases as LDL or BAD cholesterol rises.Advanced cholesterol panels optimally determine risk based on particle composition ( NMR Lipoprofile ) or by assessing multiple other genetic risks([facility] Heart Panel or [facility] Lipid Panel). These are indicated when LDL is > 130, especially if there is family history of heart attack in males before 50 or women before 42. Based on your prior advanced testing, your LDL goal is < 100 , ideally < 70. Your present LDL increases long term heart attack or stroke risk 10 %.The best dietary  information on cholesterol is Dr Gildardo Griffes book Eat, Drink & Be Healthy. Monitor fasting lipids annually. No change in medications is  Indicated.  No Diabetes risk present if A1c is < 6.1% . Liver function tests are completely normal.  Hopp

## 2011-12-17 ENCOUNTER — Other Ambulatory Visit: Payer: Self-pay | Admitting: Internal Medicine

## 2011-12-17 ENCOUNTER — Telehealth: Payer: Self-pay | Admitting: Internal Medicine

## 2011-12-17 MED ORDER — AMLODIPINE BESYLATE 5 MG PO TABS: ORAL_TABLET | ORAL | Status: DC

## 2011-12-17 NOTE — Telephone Encounter (Signed)
RX sent

## 2011-12-17 NOTE — Telephone Encounter (Signed)
Refill: Amlodipine besylate 5 mg tab. Take 1 tablet by mouth daily. Qty 30. Last fill 11-18-11

## 2012-04-24 ENCOUNTER — Telehealth: Payer: Self-pay | Admitting: Internal Medicine

## 2012-04-24 DIAGNOSIS — E785 Hyperlipidemia, unspecified: Secondary | ICD-10-CM

## 2012-04-24 DIAGNOSIS — T887XXA Unspecified adverse effect of drug or medicament, initial encounter: Secondary | ICD-10-CM

## 2012-04-24 NOTE — Telephone Encounter (Signed)
Left msg w spouse to have patient call office. BC   Ashok Croon  ----- Message -----  From: Maurice Small, CMA Sent: 04/24/2012 9:23 AM  To: Pecola Lawless, MD  Please attach patient  ----- Message -----  From: Pecola Lawless, MD Sent: 04/23/2012 9:30 AM  To: Maurice Small, CMA, Marshell Garfinkel  Calcium channel blocker blood pressure /cardiac medications such as amlodipine IN COMBINATION with Statin cholesterol lowering medications such as Simvastatin or Zocor could possibly increase risk of muscle insult.Please schedule fasting Labs : CK,Lipids, hepatic panel with an office visit in 3-5 days after the fasting labs prior to refilling the meds.

## 2012-04-25 NOTE — Telephone Encounter (Signed)
Future orders placed, patient will need to schedule labs and appointment with Dr.Hopper

## 2012-05-01 ENCOUNTER — Other Ambulatory Visit (INDEPENDENT_AMBULATORY_CARE_PROVIDER_SITE_OTHER): Payer: 59

## 2012-05-01 DIAGNOSIS — T887XXA Unspecified adverse effect of drug or medicament, initial encounter: Secondary | ICD-10-CM

## 2012-05-01 DIAGNOSIS — E785 Hyperlipidemia, unspecified: Secondary | ICD-10-CM

## 2012-05-01 LAB — HEPATIC FUNCTION PANEL
Alkaline Phosphatase: 57 U/L (ref 39–117)
Bilirubin, Direct: 0.1 mg/dL (ref 0.0–0.3)
Total Bilirubin: 0.5 mg/dL (ref 0.3–1.2)
Total Protein: 7.2 g/dL (ref 6.0–8.3)

## 2012-05-01 LAB — LIPID PANEL
Cholesterol: 183 mg/dL (ref 0–200)
LDL Cholesterol: 117 mg/dL — ABNORMAL HIGH (ref 0–99)

## 2012-05-03 ENCOUNTER — Encounter: Payer: Self-pay | Admitting: Internal Medicine

## 2012-05-03 ENCOUNTER — Ambulatory Visit (INDEPENDENT_AMBULATORY_CARE_PROVIDER_SITE_OTHER): Payer: 59 | Admitting: Internal Medicine

## 2012-05-03 VITALS — BP 144/88 | HR 76 | Wt 227.8 lb

## 2012-05-03 DIAGNOSIS — M791 Myalgia, unspecified site: Secondary | ICD-10-CM

## 2012-05-03 DIAGNOSIS — IMO0001 Reserved for inherently not codable concepts without codable children: Secondary | ICD-10-CM

## 2012-05-03 DIAGNOSIS — E785 Hyperlipidemia, unspecified: Secondary | ICD-10-CM

## 2012-05-03 DIAGNOSIS — R7309 Other abnormal glucose: Secondary | ICD-10-CM

## 2012-05-03 DIAGNOSIS — I10 Essential (primary) hypertension: Secondary | ICD-10-CM

## 2012-05-03 MED ORDER — AMLODIPINE BESYLATE 5 MG PO TABS: ORAL_TABLET | ORAL | Status: DC

## 2012-05-03 MED ORDER — LOSARTAN POTASSIUM 100 MG PO TABS: 100.0000 mg | ORAL_TABLET | Freq: Every day | ORAL | Status: DC

## 2012-05-03 NOTE — Assessment & Plan Note (Addendum)
LDL is mildly elevated at 161; goal is less than 100. I do not want to increase simvastatin dose because of combined calcium channel blocker therapy. Change to Crestor 20 mg daily should be considered. Nutritional intervention discussed  With the myalgia CK will be checked along with potassium, magnesium, calcium, and vitamin D

## 2012-05-03 NOTE — Assessment & Plan Note (Signed)
A1c should be monitored annually

## 2012-05-03 NOTE — Progress Notes (Signed)
  Subjective:    Patient ID: Nicholas Burgess, male    DOB: 1941/07/11, 71 y.o.   MRN: [id number]  HPI The patient is here for followup of  hyperlipidemia and hypertension. He is on amlodipine at 5 mg daily as well as simvastatin 40 mg daily. This raised the potential of adverse drug: Drug interaction.  The most recent lipids 05/01/12   reveal LDL 117  , HDL 40.3  , and triglycerides  127 . There is medical compliance with the statin. Hepatic panel was normal  Blood pressure range  or average is ?/ 80s  . There is medical compliance with antihypertensive medications  The most recent A1c  In 8/12 was 5.7%  , which correlates to an average sugar of 117 , and long-term risk of  14 %  . Last ophthalmologic examination 12-18 mos  revealed no retinopathy. No active podiatry assessment on record. Diet is decreased sweets  . Exercise daily @ job .          Review of Systems Constitutional: No fever, chills, significant weight change, fatigue, weakness or night sweats Eyes: No  blurred vision, double vision, or loss of vision Cardiovascular: no chest pain, palpitations, racing, irregular rhythm,syncope,nausea,sweating, claudication, or edema  Respiratory: No exertinal dyspnea, paroxysmal nocturnal dyspnea Musculoskeletal: Nocturnal muscle cramping in legs.No weakness. GI: No abdominal pain, constipation or diarrhea Dermatologic: No  change in color or temperature of skin Neurologic: No  numbness or tingling Endocrine: No change in hair/skin/ nails, excessive thirst, excessive hunger, excessive urination.      Objective:   Physical Exam Gen.: well-nourished in appearance. Alert, appropriate and cooperative throughout exam.Appears younger than stated age  Eyes: No corneal or conjunctival inflammation noted. Neck: No deformities, masses, or tenderness noted.  Thyroid normal. Lungs: Normal respiratory effort; chest expands symmetrically. Lungs are clear to auscultation without rales,  wheezes, or increased work of breathing. Heart: Normal rate and rhythm. Normal S1 and S2. No gallop, click, or rub. No murmur. Abdomen: Bowel sounds normal; abdomen soft and nontender. No masses, organomegaly or hernias noted. No AAA                                                     Musculoskeletal/extremities: No clubbing, cyanosis, edema, or deformity noted.  Nail health  good. Homans sign negative bilaterally Vascular: Carotid, radial artery, dorsalis pedis and  posterior tibial pulses are full and equal. No bruits present. Neurologic: Alert and oriented x3. Deep tendon reflexes symmetrical and normal.          Skin: Intact without suspicious lesions or rashes. Lymph: No cervical, axillary lymphadenopathy present. Psych: Mood and affect are normal. Normally interactive                                                                                         Assessment & Plan:

## 2012-05-03 NOTE — Patient Instructions (Addendum)
Please perform isometric exercises before going to bed. Sit on side of the bed and raise up on toes to a count of 5. Then put pressure on the heels to a count of 5. Repeat this process 10 times. This will improve blood flow to the calves & help prevent cramps.  To increase  Potassium (K+) increase citrus fruits & bananas in diet and use the salt substitute No Salt, which contains  potassium , to season food @ the table.   Blood Pressure Goal  Ideally is an AVERAGE < 135/85. This AVERAGE should be calculated from @ least 5-7 BP readings taken @ different times of day on different days of week. You should not respond to isolated BP readings , but rather the AVERAGE for that week   Please review Dr Gildardo Griffes book Eat, Drink & Be Healthy for dietary cholesterol information.   If you activate My Chart; the results can be released to you as soon as they populate from the lab. If you choose not to use this program; the labs have to be reviewed, copied & mailed   causing a delay in getting the results to you.

## 2012-05-03 NOTE — Assessment & Plan Note (Signed)
He'll be asked to monitor the blood pressure on a regular basis; blood pressure goals discussed

## 2012-05-04 LAB — MAGNESIUM: Magnesium: 2.4 mg/dL (ref 1.5–2.5)

## 2012-05-04 LAB — CALCIUM: Calcium: 9.1 mg/dL (ref 8.4–10.5)

## 2012-05-10 LAB — VITAMIN D 1,25 DIHYDROXY
Vitamin D 1, 25 (OH)2 Total: 60 pg/mL (ref 18–72)
Vitamin D2 1, 25 (OH)2: <8 pg/mL
Vitamin D3 1, 25 (OH)2: 60 pg/mL

## 2012-05-18 ENCOUNTER — Other Ambulatory Visit: Payer: Self-pay | Admitting: Internal Medicine

## 2012-05-18 NOTE — Telephone Encounter (Signed)
Spoke to pharmacist at [facility] they stated Rx ready. I called because I seen Rx for losartan was filled 05/03/12 #90 x 3 so I knew it was to soon for [place] to approve more. Refused request.       MW

## 2012-08-21 ENCOUNTER — Telehealth: Payer: Self-pay | Admitting: Internal Medicine

## 2012-08-21 ENCOUNTER — Encounter: Payer: Self-pay | Admitting: Internal Medicine

## 2012-08-21 ENCOUNTER — Ambulatory Visit (INDEPENDENT_AMBULATORY_CARE_PROVIDER_SITE_OTHER): Payer: BC Managed Care – PPO | Admitting: Internal Medicine

## 2012-08-21 VITALS — BP 128/82 | HR 86 | Temp 98.3°F | Resp 12 | Wt 229.0 lb

## 2012-08-21 DIAGNOSIS — I491 Atrial premature depolarization: Secondary | ICD-10-CM

## 2012-08-21 DIAGNOSIS — R079 Chest pain, unspecified: Secondary | ICD-10-CM

## 2012-08-21 NOTE — Telephone Encounter (Signed)
Spoke with patient, patient states this was a 1 time occurrence, patient with CP on Wed and feeling fine every since. Patient to be seen today at 4:30 with Dr.Hopper

## 2012-08-21 NOTE — Telephone Encounter (Signed)
Patient Information:  Caller Name: Damontre  Phone: [phone]  Patient: Nicholas Burgess, Nicholas Burgess  Gender: Male  DOB: 07/28/1940  Age: 72 Years  PCP: Marga Melnick  Office Follow Up:  Does the office need to follow up with this patient?: Yes  Instructions For The Office: Please call if this pt can be added to the 4:30 appt for Dr. Alwyn Ren today/ given this would be a 3o min appt. (RN was unable to schedule in that slot)   Symptoms  Reason For Call & Symptoms: Pt is calling for an appt for chest pain that he had last Wed. He has not had any since. Pt states it last 2-3 min and has not reoccured. When he called to schedule he was transferred to the nurse.  Reviewed Health History In EMR: Yes  Reviewed Medications In EMR: Yes  Reviewed Allergies In EMR: Yes  Reviewed Surgeries / Procedures: Yes  Date of Onset of Symptoms: 08/16/2012  Guideline(s) Used:  Chest Pain  Disposition Per Guideline:   See Today in Office  Reason For Disposition Reached:   Patient wants to be seen  Advice Given:  N/A

## 2012-08-21 NOTE — Patient Instructions (Addendum)
Review and correct the record as indicated. Please share record with all medical staff seen. To prevent palpitations or premature beats, avoid stimulants such as decongestants, diet pills, nicotine, or caffeine (coffee, tea, cola, or chocolate) to excess.

## 2012-08-21 NOTE — Progress Notes (Signed)
  Subjective:    Patient ID: Nicholas Burgess, male    DOB: 1940/09/01, 72 y.o.   MRN: [id number]  HPI Chest symptoms occurred 08/16/12 as "bee stinging " in SS area for 2-3 minutes which resolved w/o Rx.There was no specific activity , trauma, immobilization,or prolonged travel as a prelude or trigger for  the discomfort  . The pain discomfort was localized &  non radiating . The pain occurred without  exertion and was not associated with nausea, sweating, dyspnea, or palpitations. The pain was not aggravated by position change or activity. Deep breathing or coughing did not aggravate the pain. No intervention  relieved or mitigated the discomfort except rest  Past medical history/family history/social history were all reviewed and updated. Pertinent data :no FH premature CAD/MI              Review of Systems There was no associated dyspepsia, dysphagia, abdominal pain, unexplained weight loss ,melena, or  rectal bleeding. There was no associated cough, sputum production ,hemoptysis. Edema was not present. Claudication   not described. Occasional nocturnal calf  pain Associated dizziness and pre syncope were not described. There was no associated rash, color change, temperature change in the area of the pain       Objective:   Physical Exam Appears healthy and well-nourished & in no acute distress.Appears younger than stated age No carotid bruits are present.No neck pain distention present at 10 - 15 degrees. Thyroid normal to palpation. Heart rhythm and rate are normal with no significant murmurs or gallops. S1 accentuated Chest is clear with no increased work of breathing. There is no evidence of aortic aneurysm or renal artery bruits. Abdomen soft with no organomegaly or masses. No HJR. No clubbing, cyanosis or edema present. Homan's negative Pedal pulses are intact . No ischemic skin changes rashes  are present .Fingernails healthy . Alert and oriented. Strength, tone,  DTRs reflexes normal        Assessment & Plan:  #13 atypical chest pain which resolved without intervention. EKG reveals no ischemic changes. 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 There is initial poor R-wave progression in V1 but I see no evidence of prior infarction. A single PAC is noted.  Plan: He should be seen immediately if he has recurrence of symptoms. Additional evaluation with stress test will be pursued if symptoms do recur.

## 2012-10-19 ENCOUNTER — Telehealth: Payer: Self-pay | Admitting: Internal Medicine

## 2012-10-19 ENCOUNTER — Encounter (HOSPITAL_COMMUNITY): Payer: Self-pay | Admitting: Emergency Medicine

## 2012-10-19 ENCOUNTER — Emergency Department (HOSPITAL_COMMUNITY)
Admission: EM | Admit: 2012-10-19 | Discharge: 2012-10-19 | Disposition: A | Discharge status: Home / Self Care | Payer: BC Managed Care – PPO | Attending: Emergency Medicine | Admitting: Emergency Medicine

## 2012-10-19 DIAGNOSIS — Z8719 Personal history of other diseases of the digestive system: Secondary | ICD-10-CM | POA: Insufficient documentation

## 2012-10-19 DIAGNOSIS — R079 Chest pain, unspecified: Secondary | ICD-10-CM | POA: Insufficient documentation

## 2012-10-19 DIAGNOSIS — R42 Dizziness and giddiness: Secondary | ICD-10-CM

## 2012-10-19 DIAGNOSIS — Z79899 Other long term (current) drug therapy: Secondary | ICD-10-CM | POA: Insufficient documentation

## 2012-10-19 DIAGNOSIS — E785 Hyperlipidemia, unspecified: Secondary | ICD-10-CM | POA: Insufficient documentation

## 2012-10-19 DIAGNOSIS — Z860100 Personal history of colon polyps, unspecified: Secondary | ICD-10-CM | POA: Insufficient documentation

## 2012-10-19 DIAGNOSIS — R002 Palpitations: Secondary | ICD-10-CM

## 2012-10-19 DIAGNOSIS — I10 Essential (primary) hypertension: Secondary | ICD-10-CM | POA: Insufficient documentation

## 2012-10-19 DIAGNOSIS — Z7982 Long term (current) use of aspirin: Secondary | ICD-10-CM | POA: Insufficient documentation

## 2012-10-19 LAB — CBC
MCH: 31.5 pg (ref 26.0–34.0)
MCHC: 36.7 g/dL — ABNORMAL HIGH (ref 30.0–36.0)
Platelets: 173 10*3/uL (ref 150–400)
RBC: 4.92 MIL/uL (ref 4.22–5.81)
RDW: 12.7 % (ref 11.5–15.5)

## 2012-10-19 LAB — BASIC METABOLIC PANEL
Calcium: 9.6 mg/dL (ref 8.4–10.5)
Creatinine, Ser: 0.71 mg/dL (ref 0.50–1.35)
GFR calc Af Amer: >90 mL/min (ref >90)
GFR calc non Af Amer: >90 mL/min (ref >90)
Sodium: 138 mEq/L (ref 135–145)

## 2012-10-19 LAB — POCT I-STAT TROPONIN I: Troponin i, poc: 0 ng/mL (ref 0.00–0.08)

## 2012-10-19 NOTE — ED Notes (Signed)
Pt up to the br 

## 2012-10-19 NOTE — ED Provider Notes (Signed)
History     CSN: [id number]  Arrival date & time 10/19/12  1544   First MD Initiated Contact with Patient 10/19/12 1843      Chief Complaint  Patient presents with  . Dizziness    (Consider location/radiation/quality/duration/timing/severity/associated sxs/prior treatment) HPI Comments: 72 y M with PMH of HTN and HLD here for evaluation 2/2 c/o dizziness.  Pt's symptoms are actually c/w lightheaded and are a/w palpitations at the time.  No vertigo, n/v or other complaints.  No HA, facial droop, falls.  He reports one episode of "stinging chest pain" several days ago that resolved after several minutes.  None since.    Patient is a 72 y.o. male presenting with palpitations. The history is provided by the patient.  Palpitations  This is a new problem. The current episode started more than 1 week ago. Episode frequency: approx 8x daily. The problem has been resolved. The problem is associated with an unknown factor. Associated symptoms include chest pain (once, with activity, "stinging", resolved with rest after a few minutes). Pertinent negatives include no diaphoresis, no fever, no numbness, no abdominal pain, no nausea, no vomiting, no headaches and no weakness. He has tried nothing for the symptoms.    Past Medical History  Diagnosis Date  . Hypertension   . Hyperlipidemia   . Diverticulosis of colon   . Hx of colonic polyps 2007  . Syncope 2002    Past Surgical History  Procedure Laterality Date  . Colonoscopy w/ polypectomy  2007    Dr.Perry, F/U done  10/ 2012   . Appendectomy  1992  . Inguinal hernia repair  2009    bilateral  . Mandible fracture surgery  1964    Family History  Problem Relation Age of Onset  . Heart failure Father   . Hypertension Father   . Hypertension Mother   . Alzheimer's disease Mother   . Cancer Mother     GYN  . Diverticulitis Brother   . Diabetes Sister   . Colon cancer Neg Hx   . Stomach cancer Neg Hx   . Rectal cancer Neg Hx   .  Stroke Neg Hx   . Heart attack Brother 72    History  Substance Use Topics  . Smoking status: Former Smoker    Quit date: 07/12/1980  . Smokeless tobacco: Not on file  . Alcohol Use: 0.0 oz/week     Comment:  socially      Review of Systems  Constitutional: Negative for fever, chills and diaphoresis.  Cardiovascular: Positive for chest pain (once, with activity, "stinging", resolved with rest after a few minutes) and palpitations.  Gastrointestinal: Negative for nausea, vomiting and abdominal pain.  Neurological: Negative for syncope, facial asymmetry, speech difficulty, weakness, numbness and headaches.  All other systems reviewed and are negative.    Allergies  Review of patient's allergies indicates no known allergies.  Home Medications   Current Outpatient Rx  Name  Route  Sig  Dispense  Refill  . acetaminophen (TYLENOL) 500 MG tablet   Oral   Take 500 mg by mouth every 6 (six) hours as needed for pain. For headache pain         . amLODipine (NORVASC) 5 MG tablet      TAKE 1 TABLET BY MOUTH EVERY DAY   90 tablet   3   . aspirin 81 MG tablet   Oral   Take 81 mg by mouth daily.           Marland Kitchen  Cinnamon 500 MG capsule   Oral   Take 500 mg by mouth daily. Hold while in hospital         . losartan (COZAAR) 100 MG tablet   Oral   Take 1 tablet (100 mg total) by mouth daily.   90 tablet   3   . Multiple Vitamin (MULTIVITAMIN) tablet   Oral   Take 1 tablet by mouth daily.           . Omega-3 Fatty Acids (FISH OIL) 1000 MG CAPS   Oral   Take 1,000 mg by mouth daily.           . simvastatin (ZOCOR) 40 MG tablet      TAKE 1/2 TABLET BY MOUTH DAILY AT BEDTIME   45 tablet   3     BP 161/93  Pulse 63  Temp(Src) 98.2 F (36.8 C) (Oral)  Resp 16  SpO2 99%  Physical Exam  Constitutional: He is oriented to person, place, and time. He appears well-developed and well-nourished. No distress.  HENT:  Head: Normocephalic.  Right Ear: External ear  normal.  Left Ear: External ear normal.  Nose: Nose normal.  Mouth/Throat: Oropharynx is clear and moist. No oropharyngeal exudate.  Eyes: Conjunctivae and EOM are normal. Pupils are equal, round, and reactive to light.  Neck: Normal range of motion. Neck supple.  Cardiovascular: Normal rate, regular rhythm, normal heart sounds and intact distal pulses.  Exam reveals no gallop and no friction rub.   No murmur heard. Pulmonary/Chest: Effort normal and breath sounds normal.  Abdominal: Soft. Bowel sounds are normal. He exhibits no distension. There is no tenderness.  Musculoskeletal: Normal range of motion. He exhibits no edema and no tenderness.  Neurological: He is alert and oriented to person, place, and time. He has normal strength. No cranial nerve deficit or sensory deficit. He exhibits normal muscle tone. Coordination and gait normal.  Skin: Skin is warm and dry.  Psychiatric: He has a normal mood and affect.    ED Course  Procedures (including critical care time)  Labs Reviewed  CBC - Abnormal; Notable for the following:    MCHC 36.7 (*)    All other components within normal limits  BASIC METABOLIC PANEL - Abnormal; Notable for the following:    Glucose, Bld 120 (*)    All other components within normal limits  POCT I-STAT TROPONIN I   No results found.   EKG: NSR rate 74, PR 162, QRS 98, QTc 435; no findings concerning for Brugada or WPW; no STE/STD  1. Lightheadedness   2. Palpitations       MDM   72 y M with PMH of HTN and HLD here for evaluation 2/2 c/o lightheadedness a/w palpitations at the time.  No vertigo, n/v or other complaints.  No HA, facial droop, falls.  He reports one episode of "stinging chest pain" several days ago that resolved after several minutes.  None since.  Exam reassuring.  Afebrile, VSS.  Lungs clear.  Neuro exam non-focal.  Diff Dx: Dysrhythmia, BPPV, CVA, ACS.  Doubt neurologic etiology as no vertigo, n/v, falls, instability. Doubt ACS  given atypical and intermittent, sudden onset nature of symptoms.  Concern for possible intermittent dysrhythmia.  Labs sent from triage unremarkable including normal CBC, BMP and negative trop.  EKG with no signs of WPW or brugada.  QTc WNLs.  Will monitor in ED for recurrence.  8:40 PM No return of symptoms.  Pt would likely benefit  from Holter monitoring given the intermittent nature of his symptoms and he was encouraged to f/u with his PCP in this regard.  Return precautions reviewed.  It is felt the pt is stable for d/c with close PCP f/u.  Pt seen in conjunction with my attending, Dr. Rubin Payor.  Oleh Genin, MD PGY-II [facility] Emergency Medicine Resident        Oleh Genin, MD 10/20/12 [phone]

## 2012-10-19 NOTE — ED Notes (Signed)
Pt c/o intermittent dizziness x 3 weeks that has happened 8-10 times and then resolves without intervention; pt sts some palpitations with dizziness at times; pt denies complaint at present

## 2012-10-19 NOTE — Telephone Encounter (Signed)
Patient Information:  Caller Name: Thurman  Phone: [phone]  Patient: Nicholas Burgess, Nicholas Burgess  Gender: Male  DOB: Nov 26, 1940  Age: 72 Years  PCP: Marga Melnick  Office Follow Up:  Does the office need to follow up with this patient?: No  Instructions For The Office: Patient was sent to [facility] ED.     Symptoms  Reason For Call & Symptoms: Dizziness; history of HTN, BP 154/90 and reports palpitations.  Go to ED Now or to [facility] with PCP Aapproval per  Dizziness protocol  Reviewed Health History In EMR: Yes  Reviewed Medications In EMR: Yes  Reviewed Allergies In EMR: Yes  Reviewed Surgeries / Procedures: Yes  Date of Onset of Symptoms: 10/07/2012  Treatments Tried: Reduced salt intake.  Treatments Tried Worked: No  Guideline(s) Used:  Dizziness  Disposition Per Guideline:   Go to ED Now (or to Office with PCP Approval)  Reason For Disposition Reached:   Extra heart beats OR irregular heart beating (i.e., "palpitations")  Advice Given:  Call Back If:  You become worse.  Patient Will Follow Care Advice:  YES

## 2012-10-19 NOTE — Telephone Encounter (Signed)
Noted, per Dr.Hopper's protocol if patient with pending appointment or sent to ER/Urgent Care ok to close encounter

## 2012-10-19 NOTE — ED Notes (Signed)
The pt is c/o dizziness none now no chest pain .  Alert oriented skin warm and dry.  Family at the bedside

## 2012-10-20 NOTE — ED Provider Notes (Signed)
I saw and evaluated the patient, reviewed the resident's note and I agree with the findings and plan and agree with their ECG interpretation. Patient with lightheadedness and palpitations. EKG and lab work reassuring. No further return of symptoms. Will followup with PCP for further monitoring  Harrold Donath R. Rubin Payor, MD 10/20/12 2142

## 2012-11-07 ENCOUNTER — Other Ambulatory Visit: Payer: Self-pay | Admitting: Occupational Medicine

## 2012-11-07 ENCOUNTER — Ambulatory Visit: Payer: Self-pay

## 2012-11-07 DIAGNOSIS — R52 Pain, unspecified: Secondary | ICD-10-CM

## 2012-12-12 ENCOUNTER — Other Ambulatory Visit: Payer: Self-pay | Admitting: Internal Medicine

## 2012-12-13 NOTE — Telephone Encounter (Signed)
  No change in Simvastatin. Please review Dr Gildardo Griffes book Eat, Drink & Be Healthy for best  dietary cholesterol information.  Cardiovascular exercise, this can be as simple a program as walking, is recommended 30-45 minutes 3-4 times per week. If you're not exercising you should take 6-8 weeks to build up to this level.

## 2012-12-13 NOTE — Telephone Encounter (Signed)
Spoke with patient, discussed information shared at OV in October: copied and pasted -  LDL is mildly elevated at 409; goal is less than 100. I do not want to increase simvastatin dose because of combined calcium channel blocker therapy. Change to Crestor 20 mg daily should be considered. Nutritional intervention discussed    Patient was not sure if he should try Crestor or if he should continue with Simvastatin. Patient would like for Dr.Hopper to give his expert recommendation

## 2013-03-06 ENCOUNTER — Encounter: Payer: Self-pay | Admitting: Family Medicine

## 2013-03-06 ENCOUNTER — Ambulatory Visit (INDEPENDENT_AMBULATORY_CARE_PROVIDER_SITE_OTHER): Payer: BC Managed Care – PPO | Admitting: Family Medicine

## 2013-03-06 VITALS — BP 132/80 | HR 80 | Temp 98.2°F | Wt 231.2 lb

## 2013-03-06 DIAGNOSIS — I776 Arteritis, unspecified: Secondary | ICD-10-CM

## 2013-03-06 MED ORDER — MOMETASONE FUROATE 0.1 % EX CREA: TOPICAL_CREAM | Freq: Every day | CUTANEOUS | Status: DC

## 2013-03-06 MED ORDER — DOXYCYCLINE HYCLATE 100 MG PO TABS: 100.0000 mg | ORAL_TABLET | Freq: Two times a day (BID) | ORAL | Status: DC

## 2013-03-06 NOTE — Patient Instructions (Signed)
Vasculitis Vasculitis is when your blood vessels are inflamed. There are many different blood vessels in the body, and vasculitis can affect any of them. This includes large (veins and arteries) and small (capillaries) vessels. With vasculitis,   Blood vessel walls can become thick.  Blood vessels can become narrow.  Blood vessels can become weak. Sometimes, it becomes so weak that the blood vessel bulges out like a balloon. This is called an aneurysm. Aneurysms are rare but can be life-threatening.  Scarring can occur.  Not enough blood can flow through the blood vessels. All of these things can damage many parts of the body, including the muscles, kidneys, lungs and brain. There are many types of vasculitis. Some types are short-term (acute), while others are long-term (chronic). Some types may go away without treatment, and others may need to be treated for a long time.  CAUSES  Vasculitis occurs when the body's immune system (which fights germs and disease) makes a mistake. It attacks its own blood vessels. This causes inflammation (the body's way of reacting to injury or infection).   Why this happens is usually not known. The condition is then called primary vasculitis.  Sometimes, something triggers the inflammation. This is called secondary vasculitis. Possible causes include:  Infections.  An immune system disease. Examples include lupus, rheumatoid arthritis and scleroderma.  An allergic reaction to a medicine.  Cancer that affects blood cells. This includes leukemia and lymphoma.  Males and females of all ages and races can develop vasculitis. Some risk factors make vasculitis more likely. These include:  Smoking.  Stress.  Physical injury. SYMPTOMS  There are more than 20 types of vasculitis. Symptoms of each type vary, but some symptoms are common.  Many people with vasculitis:  Have a fever.  Do not feel like eating.  Lose weight.  Feel very tired.  Have  aches and pains.  Feel weak.  Start to not have feeling (numbness) in an area.  Symptoms for some types of vasculitis also could be:  Sores in the mouth or eyes.  Skin problems. This could be sores, spots or rashes.  Trouble seeing.  Trouble breathing.  Blood in the urine.  Headaches.  Pain in the abdomen.  Stuffy or bloody nose. DIAGNOSIS  Vasculitis symptoms are similar to symptoms of many other conditions. That can make it hard to tell if you have vasculitis. To be sure, your caregiver will ask about your symptoms and do a physical exam. Certain tests may be necessary, such as:   A complete blood count (CBC). This test shows how many red blood cells are in your blood. Not having enough red blood cells (anemic) can result from vasculitis.  Erythrocyte sedimentation (also called sed rate test). It measures inflammation in the body.  C reactive protein (CRP). This also shows if there is inflammation.  Anti-neutrophil cytoplasmic antibodies (ANCA). This can tell if the immune system is reacting to certain cells in the blood.  A urine test. This checks for blood or protein in the urine. That could be a sign of kidney damage from vasculitis.  Imaging tests. These tests create pictures from inside the body. Options include:  X-rays.  Computed tomography (CT) scan. This uses X-rays guided by a computer.  Ultrasound. It creates an image using sound waves.  Magnetic resonance imaging (MRI). It uses radio waves, magnets and a computer.  Angiography. A dye is put into your blood vessels. Then, an X-ray is taken of them.  A biopsy of   a blood vessel. This means your caregiver will take out a small piece of a blood vessel. Then, it is checked under a microscope. This is an important test. It often is the best way to know for sure if you have vasculitis. TREATMENT   Treatment will depend on the type of vasculitis and how severe it is. Often, you will need to see a specialist in  immunologic diseases (rheumatologist).  Some types of vasculitis may go away without treatment.  Some types need only over-the-counter drugs.  Prescription medicines are used to treat many types of vasculitis. For example:  Corticosteroids. These are the drugs used most often. They are very powerful. Usually, a high dose is taken until symptoms improve. Then, the dose is gradually decreased. Using corticosteroids for a long time can cause problems. They can make muscles and bones weak. They can cause blood pressure to go up, and cause diabetes. Also, people often gain weight when they take corticosteroids.  Cytotoxic drugs. These kill cells that cause inflammation. Sometimes, they are used if corticosteroids do not help. Other times, both medications are taken.  Surgery. This may be needed to repair a blood vessel that has bulged out (aneurysm).  Treatment can sometimes cure your disease. Other times, it can put the disease in remission (no symptoms). Increased treatment and reevaluation might be necessary if your disease comes back or flares. HOME CARE INSTRUCTIONS   Take any medications that your caregiver prescribes. Follow the directions carefully.  Watch for any problems that can be caused by a drug (side effects). Tell your caregiver right away if you notice any changes or problems.  Keep all appointments for checkups. This is important to help your caregiver watch for side effects. Checkups may include:  Periodic blood tests.  Bone density testing. This checks how strong or weak your bones are.  Blood pressure checks. If your blood pressure rises, you may need to take a drug to control it while you are taking corticosteroids.  Blood sugar checks. This is to be sure you are not developing diabetes. If you have diabetes, corticosteroid medications may make it worse and require increased treatment.  Exercise. First, talk with your caregiver about what would be OK for you to do.  Aerobic exercise (which increases your heart rate) is often suggested. It includes walking. This type of exercise is good because it helps prevent bone loss. It also helps control your blood pressure.  Follow a healthy diet. Include good sources of protein in your diet. Also include fruits, vegetables and whole grains. Your caregiver can refer you to an expert on healthy eating (dietitian) for more detailed advice.  Learn as much as you can about vasculitis. Understanding your condition can help you cope with it. Coping can be hard because this may be something you will have to live with for years.  Consider joining a support group. It often helps to talk about your worries with others who have the same problems.  Tell your caregiver if you feel stressed, anxious or depressed. Your caregiver may refer you to a specialist, or recommend medication to relieve your symptoms. SEEK MEDICAL CARE IF:   The symptoms that led to your diagnosis return.  You develop worsening fever, fatigue, headache, weight loss or pain in your jaw.  You develop signs of infection. Infections can be worse if you are on corticosteroid medication.  You develop any new or unexplained symptoms of disease. SEEK IMMEDIATE MEDICAL CARE IF:   Your eyesight changes.    Pain does not go away, even after taking medication.  You feel pain in your chest or abdomen.  You have trouble breathing.  One side of your face or body becomes suddenly weak or numb.  Your nose bleeds.  There is blood in your urine.  You develop a fever of more than 102 F (38.9 C). Document Released: 04/24/2009 Document Revised: 09/20/2011 Document Reviewed: 04/24/2009 [employer] Patient Information 2014 [employer].  

## 2013-03-06 NOTE — Progress Notes (Signed)
  Subjective:     Nicholas Burgess is a 72 y.o. male who presents for evaluation of a rash involving the calf and lower leg. Rash started a few days ago. Lesions are red, and flat in texture. Rash has changed over time. Rash causes no discomfort. Associated symptoms: none. Patient denies: abdominal pain, arthralgia, congestion, cough, crankiness, decrease in appetite, decrease in energy level, fever, headache, irritability, myalgia, nausea, sore throat and vomiting. Patient has had contacts with similar rash. Patient has not had new exposures (soaps, lotions, laundry detergents, foods, medications, plants, insects or animals).  The following portions of the patient's history were reviewed and updated as appropriate: allergies, current medications, past family history, past medical history, past social history, past surgical history and problem list.  Review of Systems Pertinent items are noted in HPI.    Objective:    BP 132/80  Pulse 80  Temp(Src) 98.2 F (36.8 C) (Oral)  Wt 231 lb 3.2 oz (104.872 kg)  BMI 28.9 kg/m2  SpO2 95% General:  alert, cooperative, appears stated age and no distress  Skin:  R low leg --- errythematous ankle to shin     Assessment:    vasculitis    Plan:    Medications: topical steroid: abx and -. Written patient instruction given. Follow up in a few weeks.  Check labs

## 2013-03-07 LAB — CBC WITH DIFFERENTIAL/PLATELET
Basophils Absolute: 0 10*3/uL (ref 0.0–0.1)
Eosinophils Absolute: 0.2 10*3/uL (ref 0.0–0.7)
Hemoglobin: 14.7 g/dL (ref 13.0–17.0)
Lymphocytes Relative: 26.6 % (ref 12.0–46.0)
Lymphs Abs: 1.9 10*3/uL (ref 0.7–4.0)
MCHC: 35 g/dL (ref 30.0–36.0)
Monocytes Absolute: 0.4 10*3/uL (ref 0.1–1.0)
Neutro Abs: 4.5 10*3/uL (ref 1.4–7.7)
RDW: 13.2 % (ref 11.5–14.6)

## 2013-03-07 LAB — SEDIMENTATION RATE: Sed Rate: 11 mm/hr (ref 0–22)

## 2013-03-19 ENCOUNTER — Encounter: Payer: Self-pay | Admitting: Family Medicine

## 2013-03-19 ENCOUNTER — Ambulatory Visit (INDEPENDENT_AMBULATORY_CARE_PROVIDER_SITE_OTHER): Payer: BC Managed Care – PPO | Admitting: Family Medicine

## 2013-03-19 VITALS — BP 120/78 | HR 75 | Temp 97.5°F | Wt 232.0 lb

## 2013-03-19 DIAGNOSIS — I776 Arteritis, unspecified: Secondary | ICD-10-CM

## 2013-03-19 NOTE — Progress Notes (Signed)
      Patient ID: Nicholas Burgess, male    DOB: [birth year], 72 y.o.   MRN: [id number]  HPI Pt here to f/u vasculitis.  He is doing well .  No new complaints.   Review of Systems As above    Objective:   Physical Exam  BP 120/78  Pulse 75  Temp(Src) 97.5 F (36.4 C) (Oral)  Wt 232 lb (105.235 kg)  BMI 29 kg/m2  SpO2 92% General appearance: alert, cooperative, appears stated age and no distress Skin: Skin color, texture, turgor normal. No rashes or lesions      Assessment & Plan:

## 2013-03-19 NOTE — Assessment & Plan Note (Signed)
resolved 

## 2013-05-21 ENCOUNTER — Encounter: Payer: Self-pay | Admitting: Internal Medicine

## 2013-05-21 ENCOUNTER — Ambulatory Visit (INDEPENDENT_AMBULATORY_CARE_PROVIDER_SITE_OTHER): Payer: Medicare Other | Admitting: Internal Medicine

## 2013-05-21 VITALS — BP 128/82 | HR 80 | Temp 98.4°F | Wt 231.8 lb

## 2013-05-21 DIAGNOSIS — R7309 Other abnormal glucose: Secondary | ICD-10-CM

## 2013-05-21 DIAGNOSIS — Z86010 Personal history of colonic polyps: Secondary | ICD-10-CM

## 2013-05-21 DIAGNOSIS — E785 Hyperlipidemia, unspecified: Secondary | ICD-10-CM

## 2013-05-21 DIAGNOSIS — I10 Essential (primary) hypertension: Secondary | ICD-10-CM

## 2013-05-21 MED ORDER — LOSARTAN POTASSIUM 100 MG PO TABS: 100.0000 mg | ORAL_TABLET | Freq: Every day | ORAL | Status: DC

## 2013-05-21 MED ORDER — AMLODIPINE BESYLATE 5 MG PO TABS: ORAL_TABLET | ORAL | Status: DC

## 2013-05-21 NOTE — Assessment & Plan Note (Signed)
schedule fasting Labs : BMET,Lipids, hepatic panel,  TSH

## 2013-05-21 NOTE — Progress Notes (Signed)
  Subjective:    Patient ID: Nicholas Burgess, male    DOB: [birth year], 72 y.o.   MRN: [id number]  HPI He is for med refills. Blood pressure range 137-157/in 80s  Compliant with anti hypertemsive medication. No lightheadedness or other adverse medication effect described.  A modified heart healthy , low sodium diet is followed; exercise 5 days  times per week as  Walking on his job without symptoms.  Family history is  for premature coronary disease. Advanced cholesterol testing reveals  LDL goal is less than 100 ; ideally < 70 . There is medication compliance with the statin.  Low dose ASA taken.  In April of this year random glucose was 120; his last A1c was 5.5% in October 2013. Lipids were done at the same time. His LDL was 117; HDL 40.3; triglycerides 127 on 20 mg of simvastatin daily.      Review of Systems Significant headaches, epistaxis, chest pain,  exertional dyspnea, claudication, paroxysmal nocturnal dyspnea, or edema absent.  Some palpitations w/o trigger. Significant abdominal symptoms, memory deficit, or myalgias not present.  He denies polyuria, polydipsia, or polyphagia. He has no numbness, tingling, burning in his extremities. He has no nonhealing skin lesions.       Objective:   Physical Exam Gen.: Healthy and well-nourished in appearance. Alert, appropriate and cooperative throughout exam.Appears younger than stated age  Head: Normocephalic without obvious abnormalities  Eyes: No corneal or conjunctival inflammation noted.  Nose: External nasal exam reveals no deformity or inflammation. Nasal mucosa are pink and moist. No lesions or exudates noted.  Mouth: Oral mucosa and oropharynx reveal no lesions or exudates. Upper & lower partials. Neck: No deformities, masses, or tenderness noted.  Thyroid normal. Lungs: Normal respiratory effort; chest expands symmetrically. Lungs are clear to auscultation without rales, wheezes, or increased work of breathing. Heart:  Normal rate and rhythm. Normal S1 and S2. No gallop, click, or rub. S4 w/o murmur. Abdomen: Bowel sounds normal; abdomen soft and nontender. No masses, organomegaly or hernias noted.                                  Musculoskeletal/extremities: No deformity or scoliosis noted of  the thoracic or lumbar spine.    No clubbing, cyanosis, edema, or significant extremity  deformity noted. Tone & strength normal. Hand joints normal . Fingernail  health good. Able to lie down & sit up w/o help.  Vascular: Carotid, radial artery, dorsalis pedis and  posterior tibial pulses are full and equal. No bruits present. Neurologic: Alert and oriented x3.   Gait normal  including heel & toe walking .        Skin: Intact without suspicious lesions or rashes. Lymph: No cervical, axillary lymphadenopathy present. Psych: Mood and affect are normal. Normally interactive                                                                                        Assessment & Plan:  See Current Assessment & Plan in Problem List under specific Diagnosis

## 2013-05-21 NOTE — Progress Notes (Signed)
Pre visit review using our clinic review tool, if applicable. No additional management support is needed unless otherwise documented below in the visit note. 

## 2013-05-21 NOTE — Assessment & Plan Note (Signed)
BMET 

## 2013-05-21 NOTE — Patient Instructions (Signed)
Your next office appointment will be determined based upon review of your pending labs . Those instructions will be transmitted to you  by mail. 

## 2013-05-21 NOTE — Assessment & Plan Note (Signed)
A1c

## 2013-05-28 ENCOUNTER — Other Ambulatory Visit (INDEPENDENT_AMBULATORY_CARE_PROVIDER_SITE_OTHER): Payer: BC Managed Care – PPO

## 2013-05-28 DIAGNOSIS — R7309 Other abnormal glucose: Secondary | ICD-10-CM

## 2013-05-28 DIAGNOSIS — E785 Hyperlipidemia, unspecified: Secondary | ICD-10-CM

## 2013-05-28 DIAGNOSIS — I10 Essential (primary) hypertension: Secondary | ICD-10-CM

## 2013-05-28 LAB — HEPATIC FUNCTION PANEL
ALT: 24 U/L (ref 0–53)
Albumin: 4.2 g/dL (ref 3.5–5.2)
Alkaline Phosphatase: 55 U/L (ref 39–117)
Bilirubin, Direct: 0 mg/dL (ref 0.0–0.3)
Total Protein: 7.3 g/dL (ref 6.0–8.3)

## 2013-05-28 LAB — BASIC METABOLIC PANEL
BUN: 12 mg/dL (ref 6–23)
CO2: 25 mEq/L (ref 19–32)
Glucose, Bld: 104 mg/dL — ABNORMAL HIGH (ref 70–99)
Potassium: 3.8 mEq/L (ref 3.5–5.1)
Sodium: 138 mEq/L (ref 135–145)

## 2013-05-28 LAB — LIPID PANEL
HDL: 40.5 mg/dL (ref >39.00)
LDL Cholesterol: 115 mg/dL — ABNORMAL HIGH (ref 0–99)
Total CHOL/HDL Ratio: 4

## 2013-05-28 LAB — CK: Total CK: 116 U/L (ref 7–232)

## 2013-05-28 LAB — TSH: TSH: 2.85 u[IU]/mL (ref 0.35–5.50)

## 2013-06-04 ENCOUNTER — Encounter: Payer: Self-pay | Admitting: *Deleted

## 2013-06-22 ENCOUNTER — Telehealth: Payer: Self-pay | Admitting: Internal Medicine

## 2013-06-22 DIAGNOSIS — T887XXA Unspecified adverse effect of drug or medicament, initial encounter: Secondary | ICD-10-CM

## 2013-06-22 DIAGNOSIS — E785 Hyperlipidemia, unspecified: Secondary | ICD-10-CM

## 2013-06-22 NOTE — Telephone Encounter (Signed)
Patient received a letter in the mail where Dr. Alwyn Ren noted " Generic Lipitor could be considered in place of Simvastatin." Patient states that he does not have a preference and if Dr. Alwyn Ren wants him to take the generic Lipitor then he will need it filled soon because he is almost out of his Simvastatin. Please advise.

## 2013-06-25 MED ORDER — ATORVASTATIN CALCIUM 20 MG PO TABS: 20.0000 mg | ORAL_TABLET | Freq: Every day | ORAL | Status: DC

## 2013-06-25 NOTE — Telephone Encounter (Signed)
Spoke with the pt and he stated that whatever Dr. Alwyn Ren would like for him to be on is okay.  Informed the pt that a new rx for Lipitor 20mg  was sent to his pharmacy and he is to have bloodwork done right before running out of the med.  Pt understood and agreed.  New rx sent to the pharmacy and future labs ordered and sent.//AB/CMA

## 2013-06-25 NOTE — Telephone Encounter (Signed)
Patient called back to return your phone call.

## 2013-06-25 NOTE — Telephone Encounter (Signed)
Lm @ (11:51am) asking the pt to RTC regarding note below.//AB/CMA

## 2013-08-13 ENCOUNTER — Ambulatory Visit (INDEPENDENT_AMBULATORY_CARE_PROVIDER_SITE_OTHER): Payer: BC Managed Care – PPO | Admitting: Internal Medicine

## 2013-08-13 ENCOUNTER — Encounter: Payer: Self-pay | Admitting: Internal Medicine

## 2013-08-13 VITALS — BP 140/80 | HR 86 | Temp 98.6°F | Wt 230.2 lb

## 2013-08-13 DIAGNOSIS — J019 Acute sinusitis, unspecified: Secondary | ICD-10-CM

## 2013-08-13 MED ORDER — HYDROCODONE-HOMATROPINE 5-1.5 MG/5ML PO SYRP: 5.0000 mL | ORAL_SOLUTION | Freq: Four times a day (QID) | ORAL | Status: DC | PRN

## 2013-08-13 MED ORDER — AMOXICILLIN 500 MG PO CAPS: 500.0000 mg | ORAL_CAPSULE | Freq: Three times a day (TID) | ORAL | Status: DC

## 2013-08-13 NOTE — Progress Notes (Signed)
Pre visit review using our clinic review tool, if applicable. No additional management support is needed unless otherwise documented below in the visit note. 

## 2013-08-13 NOTE — Patient Instructions (Addendum)

## 2013-08-13 NOTE — Progress Notes (Signed)
   Subjective:    Patient ID: Nicholas Burgess, male    DOB: 14-Oct-1940, 73 y.o.   MRN: [id number]  HPI   Symptoms began one week ago his rhinitis with clear drainage. Subsequently he developed green nasal discharge. He's also had some cough productive of green material; there is much greater volume from the head than the chest. Coricidin HB of no benefit  His eyes felt irritated and were watering excessively. There was no pus from the eyes or vision loss.     Review of Systems  He has not had fever, chills, or sweats. He denies frontal headache, facial pain, dental pain, sore throat, otic pain, otic discharge  There's been no shortness of breath or wheezing with the cough.    Objective:   Physical Exam  General appearance:good health ;well nourished; no acute distress or increased work of breathing is present.  No  lymphadenopathy about the head, neck, or axilla noted.   Eyes: Some conjunctival inflammation . There is mild scleritis.EOMI.Vision intact.No matting or purulence Ears:  External ear exam shows no significant lesions or deformities.  Otoscopic examination reveals wax bilaterally  Nose:  External nasal examination shows no deformity or inflammation. Nasal mucosa are dry without lesions or exudates. No septal dislocation or deviation.No obstruction to airflow.   Oral exam: Dental hygiene is good; lips and gums are healthy appearing.There is no oropharyngeal erythema or exudate noted.   Neck:  No deformities,  masses, or tenderness noted.   Supple with full range of motion without pain.   Heart:  Normal rate and regular rhythm. S1 and S2 normal without gallop, murmur, click, rub or other extra sounds.   Lungs:Chest clear to auscultation; no wheezes, rhonchi,rales ,or rubs present.No increased work of breathing.    Extremities:  No cyanosis, edema, or clubbing  noted    Skin: Warm & dry .         Assessment & Plan:  #1 rhinosinusitis without significant  bronchitis  Plan: Nasal hygiene interventions discussed. See prescription medications

## 2013-08-24 ENCOUNTER — Encounter: Payer: Self-pay | Admitting: Internal Medicine

## 2013-09-13 ENCOUNTER — Telehealth: Payer: Self-pay

## 2013-09-13 NOTE — Telephone Encounter (Addendum)
LM with spouse for CB  Medication List and allergies:  Reviewed and updated  90 day supply/mail order: na Local prescriptions: [facility]  Immunizations due: declines flu and PNA  A/P:   No changes to FH, PSH or Personal Hx Flu vaccine--declined PNA--declined Tdap--01/2013 Shingles--06/2010 CCS--04/2011--Dr Perry--neg PSA--02/2011--2.55  To Discuss with Provider: Not at this time

## 2013-09-14 ENCOUNTER — Ambulatory Visit (INDEPENDENT_AMBULATORY_CARE_PROVIDER_SITE_OTHER): Payer: BC Managed Care – PPO | Admitting: Internal Medicine

## 2013-09-14 ENCOUNTER — Encounter: Payer: Self-pay | Admitting: Internal Medicine

## 2013-09-14 VITALS — BP 135/77 | HR 64 | Temp 98.0°F | Ht 75.0 in | Wt 227.0 lb

## 2013-09-14 DIAGNOSIS — E785 Hyperlipidemia, unspecified: Secondary | ICD-10-CM

## 2013-09-14 DIAGNOSIS — I10 Essential (primary) hypertension: Secondary | ICD-10-CM

## 2013-09-14 DIAGNOSIS — Z0000 Encounter for general adult medical examination without abnormal findings: Secondary | ICD-10-CM

## 2013-09-14 NOTE — Patient Instructions (Signed)
Come back fasting for labs: FLP, AST, ALT -------- dx hyperlipidemia PSA ------ dx screening for prostate cancer  Next visit in 6 months for a checkup, not fasting   Fall Prevention and Home Safety Falls cause injuries and can affect all age groups. It is possible to use preventive measures to significantly decrease the likelihood of falls. There are many simple measures which can make your home safer and prevent falls. OUTDOORS  Repair cracks and edges of walkways and driveways.  Remove high doorway thresholds.  Trim shrubbery on the main path into your home.  Have good outside lighting.  Clear walkways of tools, rocks, debris, and clutter.  Check that handrails are not broken and are securely fastened. Both sides of steps should have handrails.  Have leaves, snow, and ice cleared regularly.  Use sand or salt on walkways during winter months.  In the garage, clean up grease or oil spills. BATHROOM  Install night lights.  Install grab bars by the toilet and in the tub and shower.  Use non-skid mats or decals in the tub or shower.  Place a plastic non-slip stool in the shower to sit on, if needed.  Keep floors dry and clean up all water on the floor immediately.  Remove soap buildup in the tub or shower on a regular basis.  Secure bath mats with non-slip, double-sided rug tape.  Remove throw rugs and tripping hazards from the floors. BEDROOMS  Install night lights.  Make sure a bedside light is easy to reach.  Do not use oversized bedding.  Keep a telephone by your bedside.  Have a firm chair with side arms to use for getting dressed.  Remove throw rugs and tripping hazards from the floor. KITCHEN  Keep handles on pots and pans turned toward the center of the stove. Use back burners when possible.  Clean up spills quickly and allow time for drying.  Avoid walking on wet floors.  Avoid hot utensils and knives.  Position shelves so they are not too  high or low.  Place commonly used objects within easy reach.  If necessary, use a sturdy step stool with a grab bar when reaching.  Keep electrical cables out of the way.  Do not use floor polish or wax that makes floors slippery. If you must use wax, use non-skid floor wax.  Remove throw rugs and tripping hazards from the floor. STAIRWAYS  Never leave objects on stairs.  Place handrails on both sides of stairways and use them. Fix any loose handrails. Make sure handrails on both sides of the stairways are as long as the stairs.  Check carpeting to make sure it is firmly attached along stairs. Make repairs to worn or loose carpet promptly.  Avoid placing throw rugs at the top or bottom of stairways, or properly secure the rug with carpet tape to prevent slippage. Get rid of throw rugs, if possible.  Have an electrician put in a light switch at the top and bottom of the stairs. OTHER FALL PREVENTION TIPS  Wear low-heel or rubber-soled shoes that are supportive and fit well. Wear closed toe shoes.  When using a stepladder, make sure it is fully opened and both spreaders are firmly locked. Do not climb a closed stepladder.  Add color or contrast paint or tape to grab bars and handrails in your home. Place contrasting color strips on first and last steps.  Learn and use mobility aids as needed. Install an electrical emergency response system.  Turn  on lights to avoid dark areas. Replace light bulbs that burn out immediately. Get light switches that glow.  Arrange furniture to create clear pathways. Keep furniture in the same place.  Firmly attach carpet with non-skid or double-sided tape.  Eliminate uneven floor surfaces.  Select a carpet pattern that does not visually hide the edge of steps.  Be aware of all pets. OTHER HOME SAFETY TIPS  Set the water temperature for 120 F (48.8 C).  Keep emergency numbers on or near the telephone.  Keep smoke detectors on every level  of the home and near sleeping areas. Document Released: 06/18/2002 Document Revised: 12/28/2011 Document Reviewed: 09/17/2011 [facility] Patient Information 2014 [employer].

## 2013-09-14 NOTE — Assessment & Plan Note (Signed)
Good compliance with medications, last BMP normal, good BP control.

## 2013-09-14 NOTE — Progress Notes (Signed)
Pre visit review using our clinic review tool, if applicable. No additional management support is needed unless otherwise documented below in the visit note. 

## 2013-09-14 NOTE — Progress Notes (Signed)
Subjective:    Patient ID: Nicholas Burgess, male    DOB: Jun 01, 1941, 73 y.o.   MRN: [id number]  DOS:  09/14/2013 Type of  visit: New patient to me, transferring from Dr. Linna Darner Here for Medicare AWV:  1. Risk factors based on Past M, S, F history: reviewed 2. Physical Activities:  Active at work, no routine exercise  3. Depression/mood: denies sx   4. Hearing:  No problemss noted or reported  5. ADL's:  Independent  6. Fall Risk: fell last year getting to a store, precautions discussed  7. home Safety: does feel safe at home  8. Height, weight, & visual acuity: see VS, sees eye MD ~ 1 year 9. Counseling: provided 10. Labs ordered based on risk factors: if needed  11. Referral Coordination: if needed 12. Care Plan, see assessment and plan  13. Cognitive Assessment: cognition and motor skills appropriate for age   In addition, today we discussed the following: Hypertension, good medication compliance, ambulatory BPs around 130/80. High cholesterol, was switch to Lipitor in December, no side effects, request labs. History of diverticulitis, currently asymptomatic.  ROS No fever, chills  No  CP, SOB No recent palpitations, no lower extremity edema Denies  nausea, vomiting diarrhea Denies  blood in the stools (-) cough, sputum production (-) wheezing, chest congestion No dysuria, gross hematuria, difficulty urinating ; occ urine stream is slow     Past Medical History  Diagnosis Date  . Hypertension   . Hyperlipidemia   . Diverticulosis of colon   . Hx of colonic polyps 2007  . Syncope 2002    Past Surgical History  Procedure Laterality Date  . Colonoscopy w/ polypectomy  2007 & 2012    Dr.Perry ; hyperplastic  . Appendectomy  1992  . Inguinal hernia repair  2009    bilateral  . Mandible fracture surgery  1964    History   Social History  . Marital Status: Married    Spouse Name: N/A    Number of Children: 2  . Years of Education: N/A   Occupational  History  . 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 Smoking status: Former Smoker    Quit date: 07/12/1980  . Smokeless tobacco: Never Used     Comment: smoked 1959-1982, up to 1/2 ppd  . Alcohol Use: 14.4 oz/week    24 Cans of beer per week     Comment:  socially  . Drug Use: No  . Sexual Activity: Not on file   Other Topics Concern  . Not on file   Social History Narrative  . No narrative on file     Family History  Problem Relation Age of Onset  . Heart failure Father   . Hypertension Father   . Hypertension Mother   . Alzheimer's disease Mother   . Cancer Mother     GYN  . Diverticulitis Brother   . Diabetes Sister   . Colon cancer Neg Hx   . Stomach cancer Neg Hx   . Rectal cancer Neg Hx   . Stroke Neg Hx   . Heart attack Brother 72  . Prostate cancer Neg Hx        Medication List       This list is accurate as of: 09/14/13 11:59 PM.  Always use your most recent med list.               acetaminophen 500 MG tablet  Commonly known as:  TYLENOL  Take 500 mg by mouth every 6 (six) hours as needed for pain. For headache pain     amLODipine 5 MG tablet  Commonly known as:  NORVASC  TAKE 1 TABLET BY MOUTH EVERY DAY     aspirin 81 MG tablet  Take 81 mg by mouth daily.     atorvastatin 20 MG tablet  Commonly known as:  LIPITOR  Take 1 tablet (20 mg total) by mouth daily.     Cinnamon 500 MG capsule  Take 500 mg by mouth daily. Hold while in hospital     Fish Oil 1000 MG Caps  Take 1,000 mg by mouth daily.     losartan 100 MG tablet  Commonly known as:  COZAAR  Take 1 tablet (100 mg total) by mouth daily.     mometasone 0.1 % ointment  Commonly known as:  ELOCON  Apply topically daily.     multivitamin tablet  Take 1 tablet by mouth daily.           Objective:   Physical Exam BP 135/77  Pulse 64  Temp(Src) 98 F (36.7 C)  Ht 6\' 3"  (1.905 m)  Wt 227 lb (102.967 kg)  BMI 28.37 kg/m2  SpO2 96% General -- alert,  well-developed, NAD.  Neck --no thyromegaly   HEENT-- Not pale.   Lungs -- normal respiratory effort, no intercostal retractions, no accessory muscle use, and normal breath sounds.  Heart-- normal rate, regular rhythm, no murmur.  Abdomen-- Not distended, good bowel sounds,soft, non-tender.  Rectal-- No external abnormalities noted. Normal sphincter tone. No rectal masses or tenderness. Brown stool Prostate--Prostate gland firm and smooth, no enlargement, nodularity, tenderness, mass, asymmetry or induration. Extremities-- no pretibial edema bilaterally  Neurologic--  alert & oriented X3. Speech normal, gait normal, strength normal in all extremities.  Psych-- Cognition and judgment appear intact. Cooperative with normal attention span and concentration. No anxious or depressed appearing.        Assessment & Plan:

## 2013-09-14 NOTE — Assessment & Plan Note (Signed)
Switched by  previous PCP to Lipitor back in December 2014, good compliance and tolerance, labs.

## 2013-09-14 NOTE — Assessment & Plan Note (Signed)
Flu vaccine--declined  PNA--declined  Tdap--01/2013  Shingles--06/2010  CCS--04/2011--Dr Perry--neg  PSA--02/2011--2.55 Labs from few months ago reviewed, due for a PSA.

## 2013-09-17 ENCOUNTER — Other Ambulatory Visit (INDEPENDENT_AMBULATORY_CARE_PROVIDER_SITE_OTHER): Payer: BC Managed Care – PPO

## 2013-09-17 DIAGNOSIS — T887XXA Unspecified adverse effect of drug or medicament, initial encounter: Secondary | ICD-10-CM

## 2013-09-17 DIAGNOSIS — E785 Hyperlipidemia, unspecified: Secondary | ICD-10-CM

## 2013-09-17 DIAGNOSIS — Z125 Encounter for screening for malignant neoplasm of prostate: Secondary | ICD-10-CM

## 2013-09-18 LAB — LIPID PANEL
CHOL/HDL RATIO: 4
Cholesterol: 170 mg/dL (ref 0–200)
HDL: 47.6 mg/dL (ref >39.00)
LDL Cholesterol: 106 mg/dL — ABNORMAL HIGH (ref 0–99)
TRIGLYCERIDES: 83 mg/dL (ref 0.0–149.0)
VLDL: 16.6 mg/dL (ref 0.0–40.0)

## 2013-09-18 LAB — HEPATIC FUNCTION PANEL
ALBUMIN: 4.4 g/dL (ref 3.5–5.2)
ALT: 27 U/L (ref 0–53)
AST: 24 U/L (ref 0–37)
Alkaline Phosphatase: 72 U/L (ref 39–117)
Bilirubin, Direct: 0.1 mg/dL (ref 0.0–0.3)
Total Bilirubin: 0.5 mg/dL (ref 0.3–1.2)
Total Protein: 7.1 g/dL (ref 6.0–8.3)

## 2013-09-18 LAB — CK: CK TOTAL: 130 U/L (ref 7–232)

## 2013-09-18 LAB — PSA: PSA: 2.56 ng/mL (ref 0.10–4.00)

## 2013-09-21 ENCOUNTER — Encounter: Payer: Self-pay | Admitting: *Deleted

## 2013-09-23 ENCOUNTER — Encounter: Payer: Self-pay | Admitting: Internal Medicine

## 2013-09-24 ENCOUNTER — Encounter: Payer: Self-pay | Admitting: *Deleted

## 2013-09-25 ENCOUNTER — Other Ambulatory Visit: Payer: Self-pay | Admitting: *Deleted

## 2013-09-25 MED ORDER — ATORVASTATIN CALCIUM 20 MG PO TABS: 20.0000 mg | ORAL_TABLET | Freq: Every day | ORAL | Status: DC

## 2013-10-04 ENCOUNTER — Telehealth: Payer: Self-pay

## 2013-10-04 NOTE — Telephone Encounter (Signed)
Medical Report for [employer] form was walk in by patient.  Form is currently being processed.

## 2013-10-05 ENCOUNTER — Telehealth: Payer: Self-pay | Admitting: *Deleted

## 2013-10-05 NOTE — Telephone Encounter (Signed)
Patient dropped off a form for medical report for auto insurance from [facility]. Billing sheet attached, form filled out as much as possible and placed in folder for Dr. Larose Kells. JG//CMA

## 2013-10-08 NOTE — Telephone Encounter (Signed)
Called and left message for patient to please call back an schedule an appointment with Dr. Larose Kells. Dr. Larose Kells states he cannot fill out his paperwork without an office visit because they have not discussed any issues related to the form. JG//CMA

## 2013-10-09 ENCOUNTER — Encounter: Payer: Self-pay | Admitting: Internal Medicine

## 2013-10-09 ENCOUNTER — Ambulatory Visit (INDEPENDENT_AMBULATORY_CARE_PROVIDER_SITE_OTHER): Payer: Self-pay | Admitting: Internal Medicine

## 2013-10-09 VITALS — BP 139/86 | HR 79 | Temp 98.0°F | Wt 230.0 lb

## 2013-10-09 DIAGNOSIS — I10 Essential (primary) hypertension: Secondary | ICD-10-CM

## 2013-10-09 NOTE — Progress Notes (Signed)
Pre visit review using our clinic review tool, if applicable. No additional management support is needed unless otherwise documented below in the visit note. 

## 2013-10-09 NOTE — Progress Notes (Signed)
   Subjective:    Patient ID: Nicholas Burgess, male    DOB: 12/14/1940, 73 y.o.   MRN: [id number]  DOS:  10/09/2013 Type of  visit: Here for paperwork only, needs a statement he is ok to drive. We just did a CPX, he is doing well. Has an eye check yearly and is reportedly wnl. Paperwork completed, OV canceled JP  ROS   Past Medical History  Diagnosis Date  . Hypertension   . Hyperlipidemia   . Diverticulosis of colon   . Hx of colonic polyps 2007  . Syncope 2002    Past Surgical History  Procedure Laterality Date  . Colonoscopy w/ polypectomy  2007 & 2012    Dr.Perry ; hyperplastic  . Appendectomy  1992  . Inguinal hernia repair  2009    bilateral  . Mandible fracture surgery  1964    History   Social History  . Marital Status: Married    Spouse Name: N/A    Number of Children: 2  . 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 Smoking status: Former Smoker    Quit date: 07/12/1980  . Smokeless tobacco: Never Used     Comment: smoked 1959-1982, up to 1/2 ppd  . Alcohol Use: 14.4 oz/week    24 Cans of beer per week     Comment:  socially  . Drug Use: No  . Sexual Activity: Not on file   Other Topics Concern  . Not on file   Social History Narrative  . No narrative on file        Medication List       This list is accurate as of: 10/09/13  4:35 PM.  Always use your most recent med list.               acetaminophen 500 MG tablet  Commonly known as:  TYLENOL  Take 500 mg by mouth every 6 (six) hours as needed for pain. For headache pain     amLODipine 5 MG tablet  Commonly known as:  NORVASC  TAKE 1 TABLET BY MOUTH EVERY DAY     aspirin 81 MG tablet  Take 81 mg by mouth daily.     atorvastatin 20 MG tablet  Commonly known as:  LIPITOR  Take 1 tablet (20 mg total) by mouth daily.     Cinnamon 500 MG capsule  Take 500 mg by mouth daily. Hold while in hospital     Fish Oil  1000 MG Caps  Take 1,000 mg by mouth daily.     losartan 100 MG tablet  Commonly known as:  COZAAR  Take 1 tablet (100 mg total) by mouth daily.     mometasone 0.1 % ointment  Commonly known as:  ELOCON  Apply topically daily.     multivitamin tablet  Take 1 tablet by mouth daily.           Objective:   Physical Exam BP 139/86  Pulse 79  Temp(Src) 98 F (36.7 C)  Wt 230 lb (104.327 kg)  SpO2 96%       Assessment & Plan:

## 2014-03-20 ENCOUNTER — Encounter: Payer: Self-pay | Admitting: Internal Medicine

## 2014-03-20 ENCOUNTER — Ambulatory Visit: Payer: Self-pay | Admitting: Internal Medicine

## 2014-03-20 ENCOUNTER — Ambulatory Visit (INDEPENDENT_AMBULATORY_CARE_PROVIDER_SITE_OTHER): Payer: BC Managed Care – PPO | Admitting: Internal Medicine

## 2014-03-20 VITALS — BP 132/74 | HR 67 | Temp 98.2°F | Wt 232.1 lb

## 2014-03-20 DIAGNOSIS — I10 Essential (primary) hypertension: Secondary | ICD-10-CM

## 2014-03-20 DIAGNOSIS — E785 Hyperlipidemia, unspecified: Secondary | ICD-10-CM

## 2014-03-20 NOTE — Assessment & Plan Note (Signed)
On Lipitor, cholesterol well-controlled; c/o leg cramps , wonders if related to Lipitor (unlikely); for now recommend prn tonic water,? Flexeril if no better

## 2014-03-20 NOTE — Assessment & Plan Note (Signed)
Seems well-controlled, check a BMP and CBC 

## 2014-03-20 NOTE — Patient Instructions (Signed)
Get your blood work before you leave   Please come back to the office by 09-2014 for a physical exam. Come back fasting    

## 2014-03-20 NOTE — Progress Notes (Signed)
Subjective:    Patient ID: Nicholas Burgess, male    DOB: 1941/07/12, 73 y.o.   MRN: [id number]  DOS:  03/20/2014 Type of visit - description : check up Interval history: In general feels well, good med compliance ,Ambulatory BPs usually within normal when checked. He remains active. Labs reviewed, due for a BMP CBC   ROS Denies chest pain or difficulty breathing No nausea, vomiting, diarrhea. Occasional nocturnal leg cramps, no actual myalgias.Colon Branch if sx related to   Lipitor  Past Medical History  Diagnosis Date  . Hypertension   . Hyperlipidemia   . Diverticulosis of colon   . Hx of colonic polyps 2007  . Syncope 2002    Past Surgical History  Procedure Laterality Date  . Colonoscopy w/ polypectomy  2007 & 2012    Dr.Perry ; hyperplastic  . Appendectomy  1992  . Inguinal hernia repair  2009    bilateral  . Mandible fracture surgery  1964    History   Social History  . Marital Status: Married    Spouse Name: N/A    Number of Children: 2  . 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 Smoking status: Former Smoker    Quit date: 07/12/1980  . Smokeless tobacco: Never Used     Comment: smoked 1959-1982, up to 1/2 ppd  . Alcohol Use: 14.4 oz/week    24 Cans of beer per week     Comment:  socially  . Drug Use: No  . Sexual Activity: Not on file   Other Topics Concern  . Not on file   Social History Narrative  . No narrative on file        Medication List       This list is accurate as of: 03/20/14 11:59 PM.  Always use your most recent med list.               acetaminophen 500 MG tablet  Commonly known as:  TYLENOL  Take 500 mg by mouth every 6 (six) hours as needed for pain. For headache pain     amLODipine 5 MG tablet  Commonly known as:  NORVASC  TAKE 1 TABLET BY MOUTH EVERY DAY     aspirin 81 MG tablet  Take 81 mg by mouth daily.     atorvastatin 20 MG tablet    Commonly known as:  LIPITOR  Take 1 tablet (20 mg total) by mouth daily.     Fish Oil 1000 MG Caps  Take 1,000 mg by mouth daily.     losartan 100 MG tablet  Commonly known as:  COZAAR  Take 1 tablet (100 mg total) by mouth daily.     multivitamin tablet  Take 1 tablet by mouth daily.           Objective:   Physical Exam BP 132/74  Pulse 67  Temp(Src) 98.2 F (36.8 C) (Oral)  Wt 232 lb 2 oz (105.291 kg)  SpO2 97% General -- alert, well-developed, NAD.  Lungs -- normal respiratory effort, no intercostal retractions, no accessory muscle use, and normal breath sounds.  Heart-- normal rate, regular rhythm, no murmur.  Extremities-- no pretibial edema bilaterally  Neurologic--  alert & oriented X3. Speech normal, gait appropriate for age, strength symmetric and appropriate for age.  Psych-- Cognition and judgment appear intact. Cooperative with normal attention span and concentration. No anxious or  depressed appearing.     Assessment & Plan:  Declined a flu shot

## 2014-03-20 NOTE — Progress Notes (Signed)
Pre visit review using our clinic review tool, if applicable. No additional management support is needed unless otherwise documented below in the visit note. 

## 2014-03-21 ENCOUNTER — Other Ambulatory Visit: Payer: Self-pay | Admitting: Internal Medicine

## 2014-03-21 LAB — BASIC METABOLIC PANEL
BUN: 15 mg/dL (ref 6–23)
CALCIUM: 9.3 mg/dL (ref 8.4–10.5)
CO2: 31 mEq/L (ref 19–32)
CREATININE: 0.7 mg/dL (ref 0.4–1.5)
Chloride: 102 mEq/L (ref 96–112)
GFR: 128.4 mL/min (ref >60.00)
Glucose, Bld: 83 mg/dL (ref 70–99)
Potassium: 5 mEq/L (ref 3.5–5.1)
Sodium: 138 mEq/L (ref 135–145)

## 2014-03-21 LAB — CBC WITH DIFFERENTIAL/PLATELET
BASOS PCT: 0.7 % (ref 0.0–3.0)
Basophils Absolute: 0 10*3/uL (ref 0.0–0.1)
EOS PCT: 2.4 % (ref 0.0–5.0)
Eosinophils Absolute: 0.1 10*3/uL (ref 0.0–0.7)
HCT: 42.1 % (ref 39.0–52.0)
Hemoglobin: 14.8 g/dL (ref 13.0–17.0)
LYMPHS PCT: 31.5 % (ref 12.0–46.0)
Lymphs Abs: 1.9 10*3/uL (ref 0.7–4.0)
MCHC: 35.1 g/dL (ref 30.0–36.0)
MCV: 89.8 fl (ref 78.0–100.0)
Monocytes Absolute: 0.6 10*3/uL (ref 0.1–1.0)
Monocytes Relative: 9.4 % (ref 3.0–12.0)
NEUTROS PCT: 56 % (ref 43.0–77.0)
Neutro Abs: 3.3 10*3/uL (ref 1.4–7.7)
PLATELETS: 190 10*3/uL (ref 150.0–400.0)
RBC: 4.69 Mil/uL (ref 4.22–5.81)
RDW: 12.8 % (ref 11.5–15.5)
WBC: 5.9 10*3/uL (ref 4.0–10.5)

## 2014-03-26 NOTE — Progress Notes (Signed)
Quick Note:  Send a letter Loletha Grayer , your labs are normal, continue taking the same medications, good results ______

## 2014-05-07 ENCOUNTER — Other Ambulatory Visit: Payer: Self-pay | Admitting: Internal Medicine

## 2014-05-08 ENCOUNTER — Other Ambulatory Visit: Payer: Self-pay

## 2014-05-08 DIAGNOSIS — I10 Essential (primary) hypertension: Secondary | ICD-10-CM

## 2014-05-08 MED ORDER — AMLODIPINE BESYLATE 5 MG PO TABS: ORAL_TABLET | ORAL | Status: DC

## 2014-05-21 ENCOUNTER — Other Ambulatory Visit: Payer: Self-pay

## 2014-05-21 DIAGNOSIS — I10 Essential (primary) hypertension: Secondary | ICD-10-CM

## 2014-05-21 MED ORDER — LOSARTAN POTASSIUM 100 MG PO TABS: 100.0000 mg | ORAL_TABLET | Freq: Every day | ORAL | Status: DC

## 2014-05-31 ENCOUNTER — Other Ambulatory Visit: Payer: Self-pay

## 2014-05-31 DIAGNOSIS — I10 Essential (primary) hypertension: Secondary | ICD-10-CM

## 2014-05-31 MED ORDER — LOSARTAN POTASSIUM 100 MG PO TABS: 100.0000 mg | ORAL_TABLET | Freq: Every day | ORAL | Status: DC

## 2014-08-16 ENCOUNTER — Other Ambulatory Visit: Payer: Self-pay | Admitting: Internal Medicine

## 2014-09-20 ENCOUNTER — Encounter: Payer: Self-pay | Admitting: Internal Medicine

## 2014-09-20 ENCOUNTER — Ambulatory Visit (INDEPENDENT_AMBULATORY_CARE_PROVIDER_SITE_OTHER): Payer: BLUE CROSS/BLUE SHIELD | Admitting: Internal Medicine

## 2014-09-20 VITALS — BP 128/78 | HR 73 | Temp 98.1°F | Ht 75.0 in | Wt 230.0 lb

## 2014-09-20 DIAGNOSIS — E785 Hyperlipidemia, unspecified: Secondary | ICD-10-CM

## 2014-09-20 DIAGNOSIS — I10 Essential (primary) hypertension: Secondary | ICD-10-CM | POA: Diagnosis not present

## 2014-09-20 DIAGNOSIS — R7309 Other abnormal glucose: Secondary | ICD-10-CM

## 2014-09-20 DIAGNOSIS — Z0000 Encounter for general adult medical examination without abnormal findings: Secondary | ICD-10-CM | POA: Diagnosis not present

## 2014-09-20 DIAGNOSIS — Z23 Encounter for immunization: Secondary | ICD-10-CM | POA: Diagnosis not present

## 2014-09-20 DIAGNOSIS — R7303 Prediabetes: Secondary | ICD-10-CM

## 2014-09-20 LAB — BASIC METABOLIC PANEL
BUN: 13 mg/dL (ref 6–23)
CO2: 30 mEq/L (ref 19–32)
CREATININE: 0.73 mg/dL (ref 0.40–1.50)
Calcium: 9.3 mg/dL (ref 8.4–10.5)
Chloride: 104 mEq/L (ref 96–112)
GFR: 112.15 mL/min (ref >60.00)
Glucose, Bld: 103 mg/dL — ABNORMAL HIGH (ref 70–99)
POTASSIUM: 3.9 meq/L (ref 3.5–5.1)
Sodium: 139 mEq/L (ref 135–145)

## 2014-09-20 LAB — LIPID PANEL
CHOL/HDL RATIO: 4
Cholesterol: 174 mg/dL (ref 0–200)
HDL: 44 mg/dL (ref >39.00)
LDL Cholesterol: 99 mg/dL (ref 0–99)
NonHDL: 130
Triglycerides: 153 mg/dL — ABNORMAL HIGH (ref 0.0–149.0)
VLDL: 30.6 mg/dL (ref 0.0–40.0)

## 2014-09-20 LAB — HEMOGLOBIN A1C: Hgb A1c MFr Bld: 5.9 % (ref 4.6–6.5)

## 2014-09-20 LAB — AST: AST: 22 U/L (ref 0–37)

## 2014-09-20 LAB — ALT: ALT: 25 U/L (ref 0–53)

## 2014-09-20 NOTE — Assessment & Plan Note (Signed)
Check A1c. 

## 2014-09-20 NOTE — Progress Notes (Signed)
Pre visit review using our clinic review tool, if applicable. No additional management support is needed unless otherwise documented below in the visit note. 

## 2014-09-20 NOTE — Progress Notes (Signed)
Subjective:    Patient ID: Nicholas Burgess, male    DOB: April 15, 1941, 74 y.o.   MRN: [id number]  DOS:  09/20/2014 Type of visit - description :    Here for Medicare AWV:  1. Risk factors based on Past M, S, F history: reviewed 2. Physical Activities: Active at work, no routine exercise  3. Depression/mood: denies sx  4. Hearing:No problemss noted or reported  5. ADL's: Independent  6. Fall Risk: no recent fall , precautions discussed  7. home Safety: does feel safe at home  8. Height, weight, & visual acuity: see VS, sees eye MD ~ 1 year 9. Counseling: provided 10. Labs ordered based on risk factors: if needed  11. Referral Coordination: if needed 12. Care Plan, see assessment and plan ; written plan provided 13. Cognitive Assessment: cognition and motor skills appropriate for  14. Care team updated: Dr Leonarda Salon (eye) 15. End-of-life care discussed, info provided   In addition, today we discussed the following: Hypertension, good compliance of medication, ambulatory BPs 140s/80s. High cholesterol, on medication, no apparent side effects. Needs paperwork completed.   Review of Systems Constitutional: No fever, chills. No unexplained wt changes. No unusual sweats HEENT: No dental problems, ear discharge, facial swelling, voice changes. No eye discharge, redness or intolerance to light Respiratory: No wheezing or difficulty breathing. No cough , mucus production Cardiovascular: No CP, leg swelling or palpitations GI: no nausea, vomiting, diarrhea or abdominal pain.  No blood in the stools. No dysphagia   Endocrine: No polyphagia, polyuria or polydipsia GU: No dysuria, gross hematuria, difficulty urinating. No urinary urgency or frequency. Musculoskeletal: No joint swellings or unusual aches or pains Skin: No change in the color of the skin, palor or rash Allergic, immunologic: No environmental allergies or food allergies Neurological: No dizziness or syncope. No  headaches. No diplopia, slurred speech, motor deficits, facial numbness Hematological: No enlarged lymph nodes, easy bruising or bleeding Psychiatry: No suicidal ideas, hallucinations, behavior problems or confusion. No unusual/severe anxiety or depression.     Past Medical History  Diagnosis Date  . Hypertension   . Hyperlipidemia   . Diverticulosis of colon   . Hx of colonic polyps 2007  . Syncope 2002    Past Surgical History  Procedure Laterality Date  . Colonoscopy w/ polypectomy  2007 & 2012    Dr.Perry ; hyperplastic  . Appendectomy  1992  . Inguinal hernia repair  2009    bilateral  . Mandible fracture surgery  1964    History   Social History  . Marital Status: Married    Spouse Name: N/A  . Number of Children: 2  . 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 Smoking status: Former Smoker    Quit date: 07/12/1980  . Smokeless tobacco: Never Used     Comment: smoked 1959-1982, up to 1/2 ppd  . Alcohol Use: 14.4 oz/week    24 Cans of beer per week     Comment:  socially  . Drug Use: No  . Sexual Activity: Not on file   Other Topics Concern  . Not on file   Social History Narrative     Family History  Problem Relation Age of Onset  . Heart failure Father   . Hypertension Father   . Hypertension Mother   . Alzheimer's disease Mother   . Cancer Mother     GYN  .  Diverticulitis Brother   . Diabetes Sister   . Colon cancer Neg Hx   . Stomach cancer Neg Hx   . Rectal cancer Neg Hx   . Stroke Neg Hx   . Heart attack Brother 66  . Prostate cancer Neg Hx        Medication List       This list is accurate as of: 09/20/14  9:25 AM.  Always use your most recent med list.               acetaminophen 500 MG tablet  Commonly known as:  TYLENOL  Take 500 mg by mouth every 6 (six) hours as needed for pain. For headache pain     amLODipine 5 MG tablet  Commonly known as:  NORVASC   TAKE 1 TABLET BY MOUTH EVERY DAY     aspirin 81 MG tablet  Take 81 mg by mouth daily.     atorvastatin 20 MG tablet  Commonly known as:  LIPITOR  TAKE 1 TABLET BY MOUTH DAILY.     Fish Oil 1000 MG Caps  Take 1,000 mg by mouth daily.     losartan 100 MG tablet  Commonly known as:  COZAAR  Take 1 tablet (100 mg total) by mouth daily.     multivitamin tablet  Take 1 tablet by mouth daily.           Objective:   Physical Exam BP 128/78 mmHg  Pulse 73  Temp(Src) 98.1 F (36.7 C) (Oral)  Ht 6\' 3"  (1.905 m)  Wt 230 lb (104.327 kg)  BMI 28.75 kg/m2  SpO2 97% General:   Well developed, well nourished . NAD.  Neck:  Full range of motion. Supple. No  thyromegaly , normal carotid pulse HEENT:  Normocephalic . Face symmetric, atraumatic Lungs:  CTA B Normal respiratory effort, no intercostal retractions, no accessory muscle use. Heart: RRR,  no murmur.  Abdomen:  Not distended, soft, non-tender. No rebound or rigidity. No mass,organomegaly, no bruit  Muscle skeletal: Trace  pretibial edema bilaterally  Skin: Exposed areas without rash. Not pale. Not jaundice Neurologic:  alert & oriented X3.  Speech normal, gait appropriate for age and unassisted Strength symmetric and appropriate for age.  Psych: Cognition and judgment appear intact.  Cooperative with normal attention span and concentration.  Behavior appropriate. No anxious or depressed appearing.        Assessment & Plan:

## 2014-09-20 NOTE — Assessment & Plan Note (Signed)
Continue amlodipine and losartan, trace edema noted, check a BMP.

## 2014-09-20 NOTE — Assessment & Plan Note (Signed)
Continue Lipitor, check FLP, AST, ALT

## 2014-09-20 NOTE — Patient Instructions (Signed)
Get your blood work before you leave   Come back to the office in 8 months   for a routine check up  No fasting       If you need more information about a healthy diet,   visit  the American Heart Association, it  is a Financial planner at:  http://www.richard-flynn.net/      Fall Prevention and [place] cause injuries and can affect all age groups. It is possible to use preventive measures to significantly decrease the likelihood of falls. There are many simple measures which can make your home safer and prevent falls. OUTDOORS  Repair cracks and edges of walkways and driveways.  Remove high doorway thresholds.  Trim shrubbery on the main path into your home.  Have good outside lighting.  Clear walkways of tools, rocks, debris, and clutter.  Check that handrails are not broken and are securely fastened. Both sides of steps should have handrails.  Have leaves, snow, and ice cleared regularly.  Use sand or salt on walkways during winter months.  In the garage, clean up grease or oil spills. BATHROOM  Install night lights.  Install grab bars by the toilet and in the tub and shower.  Use non-skid mats or decals in the tub or shower.  Place a plastic non-slip stool in the shower to sit on, if needed.  Keep floors dry and clean up all water on the floor immediately.  Remove soap buildup in the tub or shower on a regular basis.  Secure bath mats with non-slip, double-sided rug tape.  Remove throw rugs and tripping hazards from the floors. BEDROOMS  Install night lights.  Make sure a bedside light is easy to reach.  Do not use oversized bedding.  Keep a telephone by your bedside.  Have a firm chair with side arms to use for getting dressed.  Remove throw rugs and tripping hazards from the floor. KITCHEN  Keep handles on pots and pans turned toward the center of the stove. Use back burners when possible.  Clean up spills quickly and allow time  for drying.  Avoid walking on wet floors.  Avoid hot utensils and knives.  Position shelves so they are not too high or low.  Place commonly used objects within easy reach.  If necessary, use a sturdy step stool with a grab bar when reaching.  Keep electrical cables out of the way.  Do not use floor polish or wax that makes floors slippery. If you must use wax, use non-skid floor wax.  Remove throw rugs and tripping hazards from the floor. STAIRWAYS  Never leave objects on stairs.  Place handrails on both sides of stairways and use them. Fix any loose handrails. Make sure handrails on both sides of the stairways are as long as the stairs.  Check carpeting to make sure it is firmly attached along stairs. Make repairs to worn or loose carpet promptly.  Avoid placing throw rugs at the top or bottom of stairways, or properly secure the rug with carpet tape to prevent slippage. Get rid of throw rugs, if possible.  Have an electrician put in a light switch at the top and bottom of the stairs. OTHER FALL PREVENTION TIPS  Wear low-heel or rubber-soled shoes that are supportive and fit well. Wear closed toe shoes.  When using a stepladder, make sure it is fully opened and both spreaders are firmly locked. Do not climb a closed stepladder.  Add color or contrast paint or  tape to grab bars and handrails in your home. Place contrasting color strips on first and last steps.  Learn and use mobility aids as needed. Install an electrical emergency response system.  Turn on lights to avoid dark areas. Replace light bulbs that burn out immediately. Get light switches that glow.  Arrange furniture to create clear pathways. Keep furniture in the same place.  Firmly attach carpet with non-skid or double-sided tape.  Eliminate uneven floor surfaces.  Select a carpet pattern that does not visually hide the edge of steps.  Be aware of all pets. OTHER HOME SAFETY TIPS  Set the water  temperature for 120 F (48.8 C).  Keep emergency numbers on or near the telephone.  Keep smoke detectors on every level of the home and near sleeping areas. Document Released: 06/18/2002 Document Revised: 12/28/2011 Document Reviewed: 09/17/2011 [facility] Patient Information 2015 [place], [place]. This information is not intended to replace advice given to you by your health care provider. Make sure you discuss any questions you have with your health care provider.   Preventive Care for Adults Ages 18 and over  Blood pressure check.** / Every 1 to 2 years.  Lipid and cholesterol check.**/ Every 5 years beginning at age 42.  Lung cancer screening. / Every year if you are aged 67-80 years and have a 30-pack-year history of smoking and currently smoke or have quit within the past 15 years. Yearly screening is stopped once you have quit smoking for at least 15 years or develop a health problem that would prevent you from having lung cancer treatment.  Fecal occult blood test (FOBT) of stool. / Every year beginning at age 2 and continuing until age 55. You may not have to do this test if you get a colonoscopy every 10 years.  Flexible sigmoidoscopy** or colonoscopy.** / Every 5 years for a flexible sigmoidoscopy or every 10 years for a colonoscopy beginning at age 81 and continuing until age 43.  Hepatitis C blood test.** / For all people born from [birth year] through 1965 and any individual with known risks for hepatitis C.  Abdominal aortic aneurysm (AAA) screening.** / A one-time screening for ages 13 to 69 years who are current or former smokers.  Skin self-exam. / Monthly.  Influenza vaccine. / Every year.  Tetanus, diphtheria, and acellular pertussis (Tdap/Td) vaccine.** / 1 dose of Td every 10 years.  Varicella vaccine.** / Consult your health care provider.  Zoster vaccine.** / 1 dose for adults aged 7 years or older.  Pneumococcal 13-valent conjugate (PCV13) vaccine.** / Consult  your health care provider.  Pneumococcal polysaccharide (PPSV23) vaccine.** / 1 dose for all adults aged 71 years and older.  Meningococcal vaccine.** / Consult your health care provider.  Hepatitis A vaccine.** / Consult your health care provider.  Hepatitis B vaccine.** / Consult your health care provider.  Haemophilus influenzae type b (Hib) vaccine.** / Consult your health care provider. **Family history and personal history of risk and conditions may change your health care provider's recommendations. Document Released: 08/24/2001 Document Revised: 07/03/2013 Document Reviewed: 11/23/2010 [facility] Patient Information 2015 [place], [place]. This information is not intended to replace advice given to you by your health care provider. Make sure you discuss any questions you have with your health care provider.

## 2014-09-20 NOTE — Assessment & Plan Note (Addendum)
  PNA--  Today Prevnar, will discuss next year Tdap--01/2013  Shingles--06/2010  CCS--04/2011--Dr Perry--neg  DRE normal 2015, PSAs stable, reassess prostate cancer screening next year Labs Follows a regular diet, encourage a healthier diet.

## 2014-12-13 ENCOUNTER — Ambulatory Visit (INDEPENDENT_AMBULATORY_CARE_PROVIDER_SITE_OTHER): Payer: 59 | Admitting: Internal Medicine

## 2014-12-13 ENCOUNTER — Encounter: Payer: Self-pay | Admitting: Internal Medicine

## 2014-12-13 VITALS — BP 128/78 | HR 78 | Temp 97.8°F | Ht 75.0 in | Wt 234.0 lb

## 2014-12-13 DIAGNOSIS — N400 Enlarged prostate without lower urinary tract symptoms: Secondary | ICD-10-CM | POA: Insufficient documentation

## 2014-12-13 DIAGNOSIS — R319 Hematuria, unspecified: Secondary | ICD-10-CM

## 2014-12-13 DIAGNOSIS — R361 Hematospermia: Secondary | ICD-10-CM

## 2014-12-13 LAB — URINALYSIS, ROUTINE W REFLEX MICROSCOPIC
Bilirubin Urine: NEGATIVE
Hgb urine dipstick: NEGATIVE
Ketones, ur: NEGATIVE
Leukocytes, UA: NEGATIVE
Nitrite: NEGATIVE
PH: 6 (ref 5.0–8.0)
SPECIFIC GRAVITY, URINE: 1.015 (ref 1.000–1.030)
TOTAL PROTEIN, URINE-UPE24: NEGATIVE
URINE GLUCOSE: NEGATIVE
Urobilinogen, UA: 0.2 (ref 0.0–1.0)

## 2014-12-13 LAB — POCT URINALYSIS DIPSTICK
Bilirubin, UA: NEGATIVE
GLUCOSE UA: NEGATIVE
Ketones, UA: NEGATIVE
Leukocytes, UA: NEGATIVE
NITRITE UA: NEGATIVE
PH UA: 6
RBC UA: NEGATIVE
SPEC GRAV UA: 1.025
UROBILINOGEN UA: 0.2

## 2014-12-13 LAB — PSA: PSA: 2.16 ng/mL (ref 0.10–4.00)

## 2014-12-13 NOTE — Assessment & Plan Note (Signed)
Single episode of hematospermia, no other symptoms, exam is essentially benign. Per literature review, if urinalysis is normal no further eval is needed unless symptoms recur. Patient aware and will call if has more problems. Will check a UA, urine culture and a PSA

## 2014-12-13 NOTE — Patient Instructions (Signed)
Go to the lab before you leave.  Please come back or call anytime if the symptoms happen again

## 2014-12-13 NOTE — Progress Notes (Signed)
Pre visit review using our clinic review tool, if applicable. No additional management support is needed unless otherwise documented below in the visit note. 

## 2014-12-13 NOTE — Progress Notes (Signed)
Subjective:    Patient ID: Nicholas Burgess, male    DOB: [birth year], 74 y.o.   MRN: [id number]  DOS:  12/13/2014 Type of visit - description : acute Interval history: 6 days ago, noticed blood in his sperm. Other than that he feels well. No previous similar episodes. Denies gross hematuria.has not taken any meds for his sx , no further sx.    Review of Systems No fever chills No abdominal pain, flank pain Very seldom has mild dysuria and his urinary flow feels slow but nothing acute, symptoms are stable.  Past Medical History  Diagnosis Date  . Hypertension   . Hyperlipidemia   . Diverticulosis of colon   . Hx of colonic polyps 2007  . Syncope 2002    Past Surgical History  Procedure Laterality Date  . Colonoscopy w/ polypectomy  2007 & 2012    Dr.Perry ; hyperplastic  . Appendectomy  1992  . Inguinal hernia repair  2009    bilateral  . Mandible fracture surgery  1964    History   Social History  . Marital Status: Married    Spouse Name: N/A  . Number of Children: 2  . 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 Smoking status: Former Smoker    Quit date: 07/12/1980  . Smokeless tobacco: Never Used     Comment: smoked 1959-1982, up to 1/2 ppd  . Alcohol Use: 14.4 oz/week    24 Cans of beer per week     Comment:  socially  . Drug Use: No  . Sexual Activity: Not on file   Other Topics Concern  . Not on file   Social History Narrative   Household pt and wife        Medication List       This list is accurate as of: 12/13/14  2:33 PM.  Always use your most recent med list.               acetaminophen 500 MG tablet  Commonly known as:  TYLENOL  Take 500 mg by mouth every 6 (six) hours as needed for pain. For headache pain     amLODipine 5 MG tablet  Commonly known as:  NORVASC  TAKE 1 TABLET BY MOUTH EVERY DAY     aspirin 81 MG tablet  Take 81 mg by mouth daily.     atorvastatin 20 MG tablet  Commonly known as:  LIPITOR  TAKE 1 TABLET BY MOUTH DAILY.     Fish Oil 1000 MG Caps  Take 1,000 mg by mouth daily.     losartan 100 MG tablet  Commonly known as:  COZAAR  Take 1 tablet (100 mg total) by mouth daily.     multivitamin tablet  Take 1 tablet by mouth daily.           Objective:   Physical Exam BP 128/78 mmHg  Pulse 78  Temp(Src) 97.8 F (36.6 C) (Oral)  Ht 6\' 3"  (1.905 m)  Wt 234 lb (106.142 kg)  BMI 29.25 kg/m2  SpO2 97% General:   Well developed, well nourished . NAD.  HEENT:  Normocephalic . Face symmetric, atraumatic Abd: Not distended, nontender, soft, good bowel sounds. GU: Penis normal to inspection on palpation, scrotal contents normal. DRE, no external abnormalities, prostate is slightly enlarged, soft, not tender or nodular. No mass,  specifically I was not able to palpate  the seminal vesicles. Skin: Not pale. Not jaundice Neurologic:  alert & oriented X3.  Speech normal, gait appropriate for age and unassisted Psych--  Cognition and judgment appear intact.  Cooperative with normal attention span and concentration.  Behavior appropriate. No anxious or depressed appearing.        Assessment & Plan:

## 2014-12-15 LAB — URINE CULTURE
Colony Count: NO GROWTH
Organism ID, Bacteria: NO GROWTH

## 2015-02-08 ENCOUNTER — Other Ambulatory Visit: Payer: Self-pay | Admitting: Internal Medicine

## 2015-03-18 ENCOUNTER — Ambulatory Visit (INDEPENDENT_AMBULATORY_CARE_PROVIDER_SITE_OTHER): Payer: 59 | Admitting: Internal Medicine

## 2015-03-18 ENCOUNTER — Encounter: Payer: Self-pay | Admitting: Internal Medicine

## 2015-03-18 VITALS — BP 116/84 | HR 78 | Temp 97.6°F | Ht 75.0 in | Wt 237.0 lb

## 2015-03-18 DIAGNOSIS — R21 Rash and other nonspecific skin eruption: Secondary | ICD-10-CM | POA: Diagnosis not present

## 2015-03-18 DIAGNOSIS — Z09 Encounter for follow-up examination after completed treatment for conditions other than malignant neoplasm: Secondary | ICD-10-CM

## 2015-03-18 MED ORDER — HYDROCORTISONE 2.5 % EX CREA: TOPICAL_CREAM | Freq: Two times a day (BID) | CUTANEOUS | Status: DC

## 2015-03-18 MED ORDER — CEPHALEXIN 500 MG PO CAPS: 500.0000 mg | ORAL_CAPSULE | Freq: Four times a day (QID) | ORAL | Status: DC
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
## 2015-03-18 NOTE — Patient Instructions (Signed)
Use the cream twice a day until better Take antibiotics as prescribed Call if not gradually improving in the next 5 days, call anytime if the redness spreads, you have fever, chills or the area looks worse

## 2015-03-18 NOTE — Progress Notes (Signed)
Pre visit review using our clinic review tool, if applicable. No additional management support is needed unless otherwise documented below in the visit note. 

## 2015-03-24 ENCOUNTER — Other Ambulatory Visit: Payer: Self-pay | Admitting: Internal Medicine

## 2015-05-07 ENCOUNTER — Other Ambulatory Visit: Payer: Self-pay | Admitting: Internal Medicine

## 2015-05-23 ENCOUNTER — Ambulatory Visit (INDEPENDENT_AMBULATORY_CARE_PROVIDER_SITE_OTHER): Payer: 59 | Admitting: Internal Medicine

## 2015-05-23 ENCOUNTER — Encounter: Payer: Self-pay | Admitting: Internal Medicine

## 2015-05-23 VITALS — BP 134/72 | HR 76 | Temp 98.1°F | Ht 75.0 in | Wt 240.4 lb

## 2015-05-23 DIAGNOSIS — R361 Hematospermia: Secondary | ICD-10-CM

## 2015-05-23 DIAGNOSIS — I10 Essential (primary) hypertension: Secondary | ICD-10-CM | POA: Diagnosis not present

## 2015-05-23 DIAGNOSIS — Z09 Encounter for follow-up examination after completed treatment for conditions other than malignant neoplasm: Secondary | ICD-10-CM

## 2015-05-23 MED ORDER — HYDROCHLOROTHIAZIDE 25 MG PO TABS: 25.0000 mg | ORAL_TABLET | Freq: Every day | ORAL | Status: DC
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
## 2015-05-23 NOTE — Patient Instructions (Signed)
Stop amlodipine  Start hydrochlorothiazide one tablet every morning  Please schedule labs to be done 2 weeks from today, no need to be fasting  Check the  blood pressure 2 or 3 times a    Week  Be sure your blood pressure is between 110/65 and  145/85.  if it is consistently higher or lower, let me know   Next visit  for a complete physical exam by March 2017, fasting  Please schedule an appointment at the front desk

## 2015-05-23 NOTE — Progress Notes (Signed)
Pre visit review using our clinic review tool, if applicable. No additional management support is needed unless otherwise documented below in the visit note. 
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
## 2015-06-09 ENCOUNTER — Telehealth: Payer: Self-pay

## 2015-06-09 ENCOUNTER — Other Ambulatory Visit (INDEPENDENT_AMBULATORY_CARE_PROVIDER_SITE_OTHER): Payer: 59

## 2015-06-09 DIAGNOSIS — R361 Hematospermia: Secondary | ICD-10-CM

## 2015-06-09 DIAGNOSIS — I10 Essential (primary) hypertension: Secondary | ICD-10-CM | POA: Diagnosis not present

## 2015-06-09 NOTE — Telephone Encounter (Signed)
Urology referral placed

## 2015-06-09 NOTE — Telephone Encounter (Signed)
Needs better control, for now continue with same medications, watch salt intake and stay active. He is due for a BMP, please be sure the patient come back for that. Call with BP readings in 4 weeks. Ask pt if urology referral related to his h/o hematospermia  ( Blood in the semen ) or something else ?

## 2015-06-09 NOTE — Telephone Encounter (Signed)
Spoke with Pt, informed him of recommendations. BMP was drawn today, Pt requesting referral for blood in semen. Instructed Pt to call with BP readings in 4 weeks, and that he will receive a call to schedule appt with Urology.

## 2015-06-09 NOTE — Telephone Encounter (Signed)
thx

## 2015-06-09 NOTE — Telephone Encounter (Signed)
Pt dropped off BP readings:  157/92 126/71 146/78 155/85 157/83 163/91 154/85 170/92 118/67 113/61  Pt also requesting referral to Urology. Please advise.

## 2015-06-10 LAB — BASIC METABOLIC PANEL
BUN: 13 mg/dL (ref 6–23)
CO2: 31 mEq/L (ref 19–32)
Calcium: 9.6 mg/dL (ref 8.4–10.5)
Chloride: 100 mEq/L (ref 96–112)
Creatinine, Ser: 0.75 mg/dL (ref 0.40–1.50)
GFR: 108.49 mL/min (ref >60.00)
Glucose, Bld: 87 mg/dL (ref 70–99)
Potassium: 4 mEq/L (ref 3.5–5.1)
Sodium: 139 mEq/L (ref 135–145)

## 2015-06-17 ENCOUNTER — Encounter: Payer: Self-pay | Admitting: Internal Medicine

## 2015-06-17 ENCOUNTER — Other Ambulatory Visit: Payer: Self-pay | Admitting: Internal Medicine

## 2015-08-01 ENCOUNTER — Encounter: Payer: Self-pay | Admitting: Internal Medicine

## 2015-08-18 ENCOUNTER — Telehealth: Payer: Self-pay | Admitting: Internal Medicine

## 2015-08-18 NOTE — Telephone Encounter (Signed)
Flu shot declined in chart.  

## 2015-08-18 NOTE — Telephone Encounter (Signed)
Pt declines flu shot stating he doesn't usually get it.  Scheduled CPE for 11/26/15.

## 2015-09-16 ENCOUNTER — Other Ambulatory Visit: Payer: Self-pay | Admitting: Internal Medicine

## 2015-11-25 ENCOUNTER — Telehealth: Payer: Self-pay | Admitting: Behavioral Health

## 2015-11-25 NOTE — Telephone Encounter (Signed)
Unable to reach patient at time of Pre-Visit Call.  Left message for patient to return call when available.    

## 2015-11-26 ENCOUNTER — Encounter: Payer: Self-pay | Admitting: Internal Medicine

## 2015-11-26 ENCOUNTER — Ambulatory Visit (INDEPENDENT_AMBULATORY_CARE_PROVIDER_SITE_OTHER): Payer: 59 | Admitting: Internal Medicine

## 2015-11-26 VITALS — BP 120/64 | HR 62 | Temp 97.6°F | Ht 75.0 in | Wt 236.5 lb

## 2015-11-26 DIAGNOSIS — Z09 Encounter for follow-up examination after completed treatment for conditions other than malignant neoplasm: Secondary | ICD-10-CM

## 2015-11-26 DIAGNOSIS — R739 Hyperglycemia, unspecified: Secondary | ICD-10-CM | POA: Diagnosis not present

## 2015-11-26 DIAGNOSIS — Z0000 Encounter for general adult medical examination without abnormal findings: Secondary | ICD-10-CM

## 2015-11-26 DIAGNOSIS — E785 Hyperlipidemia, unspecified: Secondary | ICD-10-CM

## 2015-11-26 DIAGNOSIS — I10 Essential (primary) hypertension: Secondary | ICD-10-CM | POA: Diagnosis not present

## 2015-11-26 DIAGNOSIS — Z23 Encounter for immunization: Secondary | ICD-10-CM | POA: Diagnosis not present

## 2015-11-26 NOTE — Patient Instructions (Signed)
Get your blood work before you leave   Next visit in 8 months, no fasting  Please see the info regards a  healthcare power of attorney    Fall Prevention and Home Safety Falls cause injuries and can affect all age groups. It is possible to use preventive measures to significantly decrease the likelihood of falls. There are many simple measures which can make your home safer and prevent falls. OUTDOORS  Repair cracks and edges of walkways and driveways.  Remove high doorway thresholds.  Trim shrubbery on the main path into your home.  Have good outside lighting.  Clear walkways of tools, rocks, debris, and clutter.  Check that handrails are not broken and are securely fastened. Both sides of steps should have handrails.  Have leaves, snow, and ice cleared regularly.  Use sand or salt on walkways during winter months.  In the garage, clean up grease or oil spills. BATHROOM  Install night lights.  Install grab bars by the toilet and in the tub and shower.  Use non-skid mats or decals in the tub or shower.  Place a plastic non-slip stool in the shower to sit on, if needed.  Keep floors dry and clean up all water on the floor immediately.  Remove soap buildup in the tub or shower on a regular basis.  Secure bath mats with non-slip, double-sided rug tape.  Remove throw rugs and tripping hazards from the floors. BEDROOMS  Install night lights.  Make sure a bedside light is easy to reach.  Do not use oversized bedding.  Keep a telephone by your bedside.  Have a firm chair with side arms to use for getting dressed.  Remove throw rugs and tripping hazards from the floor. KITCHEN  Keep handles on pots and pans turned toward the center of the stove. Use back burners when possible.  Clean up spills quickly and allow time for drying.  Avoid walking on wet floors.  Avoid hot utensils and knives.  Position shelves so they are not too high or low.  Place commonly  used objects within easy reach.  If necessary, use a sturdy step stool with a grab bar when reaching.  Keep electrical cables out of the way.  Do not use floor polish or wax that makes floors slippery. If you must use wax, use non-skid floor wax.  Remove throw rugs and tripping hazards from the floor. STAIRWAYS  Never leave objects on stairs.  Place handrails on both sides of stairways and use them. Fix any loose handrails. Make sure handrails on both sides of the stairways are as long as the stairs.  Check carpeting to make sure it is firmly attached along stairs. Make repairs to worn or loose carpet promptly.  Avoid placing throw rugs at the top or bottom of stairways, or properly secure the rug with carpet tape to prevent slippage. Get rid of throw rugs, if possible.  Have an electrician put in a light switch at the top and bottom of the stairs. OTHER FALL PREVENTION TIPS  Wear low-heel or rubber-soled shoes that are supportive and fit well. Wear closed toe shoes.  When using a stepladder, make sure it is fully opened and both spreaders are firmly locked. Do not climb a closed stepladder.  Add color or contrast paint or tape to grab bars and handrails in your home. Place contrasting color strips on first and last steps.  Learn and use mobility aids as needed. Install an electrical emergency response system.  Turn  on lights to avoid dark areas. Replace light bulbs that burn out immediately. Get light switches that glow.  Arrange furniture to create clear pathways. Keep furniture in the same place.  Firmly attach carpet with non-skid or double-sided tape.  Eliminate uneven floor surfaces.  Select a carpet pattern that does not visually hide the edge of steps.  Be aware of all pets. OTHER HOME SAFETY TIPS  Set the water temperature for 120 F (48.8 C).  Keep emergency numbers on or near the telephone.  Keep smoke detectors on every level of the home and near sleeping  areas. Document Released: 06/18/2002 Document Revised: 12/28/2011 Document Reviewed: 09/17/2011 [facility] Patient Information 2015 [place], [place]. This information is not intended to replace advice given to you by your health care provider. Make sure you discuss any questions you have with your health care provider.   Preventive Care for Adults Ages 45 and over  Blood pressure check.** / Every 1 to 2 years.  Lipid and cholesterol check.**/ Every 5 years beginning at age 23.  Lung cancer screening. / Every year if you are aged 42-80 years and have a 30-pack-year history of smoking and currently smoke or have quit within the past 15 years. Yearly screening is stopped once you have quit smoking for at least 15 years or develop a health problem that would prevent you from having lung cancer treatment.  Fecal occult blood test (FOBT) of stool. / Every year beginning at age 65 and continuing until age 98. You may not have to do this test if you get a colonoscopy every 10 years.  Flexible sigmoidoscopy** or colonoscopy.** / Every 5 years for a flexible sigmoidoscopy or every 10 years for a colonoscopy beginning at age 35 and continuing until age 77.  Hepatitis C blood test.** / For all people born from [birth year] through 1964 and any individual with known risks for hepatitis C.  Abdominal aortic aneurysm (AAA) screening.** / A one-time screening for ages 32 to 50 years who are current or former smokers.  Skin self-exam. / Monthly.  Influenza vaccine. / Every year.  Tetanus, diphtheria, and acellular pertussis (Tdap/Td) vaccine.** / 1 dose of Td every 10 years.  Varicella vaccine.** / Consult your health care provider.  Zoster vaccine.** / 1 dose for adults aged 38 years or older.  Pneumococcal 13-valent conjugate (PCV13) vaccine.** / Consult your health care provider.  Pneumococcal polysaccharide (PPSV23) vaccine.** / 1 dose for all adults aged 76 years and older.  Meningococcal vaccine.** /  Consult your health care provider.  Hepatitis A vaccine.** / Consult your health care provider.  Hepatitis B vaccine.** / Consult your health care provider.  Haemophilus influenzae type b (Hib) vaccine.** / Consult your health care provider. **Family history and personal history of risk and conditions may change your health care provider's recommendations. Document Released: 08/24/2001 Document Revised: 07/03/2013 Document Reviewed: 11/23/2010 [facility] Patient Information 2015 [place], [place]. This information is not intended to replace advice given to you by your health care provider. Make sure you discuss any questions you have with your health care provider.

## 2015-11-26 NOTE — Progress Notes (Signed)
Pre visit review using our clinic review tool, if applicable. No additional management support is needed unless otherwise documented below in the visit note. 

## 2015-11-26 NOTE — Assessment & Plan Note (Addendum)
Td - 2014;  PNA 2016; Prevnar 11-2015;  Shingles--06/2010  CCS--04/2011--Dr Perry--neg  DRE normal 09-2015 @ urology, PSA 12-2014    diet and exercise discussed Counseled about the healthcare power of attorney and fall prevention

## 2015-11-26 NOTE — Progress Notes (Signed)
Subjective:    Patient ID: Nicholas Burgess, male    DOB: 17-Jan-1941, 75 y.o.   MRN: [id number]  DOS:  11/26/2015 Type of visit - description : CPX Interval history: History of hematospermia, saw urology few weeks ago, note reviewed High cholesterol: Good compliance with Lipitor HTN:  Good med compliance, ambulatory BPs 130/85 on average.    Review of Systems Constitutional: No fever. No chills. No unexplained wt changes. No unusual sweats  HEENT: No dental problems, no ear discharge, no facial swelling, no voice changes. No eye discharge, no eye  redness , no  intolerance to light   Respiratory: No wheezing , no  difficulty breathing. No cough , no mucus production  Cardiovascular: No CP, no leg swelling , no  Palpitations  GI: no nausea, no vomiting, no diarrhea , no  abdominal pain.  No blood in the stools. No dysphagia, no odynophagia    Endocrine: No polyphagia, no polyuria , no polydipsia  GU: No dysuria, gross hematuria, difficulty urinating. No urinary urgency, no frequency.  Musculoskeletal: No joint swellings or unusual aches or pains  Skin: No change in the color of the skin, palor , no  Rash. Had a small cut @ the the right leg, still healing. No discharge  Allergic, immunologic: No environmental allergies , no  food allergies  Neurological: No dizziness no  syncope. No headaches. No diplopia, no slurred, no slurred speech, no motor deficits, no facial  Numbness  Hematological: No enlarged lymph nodes, no easy bruising , no unusual bleedings  Psychiatry: No suicidal ideas, no hallucinations, no beavior problems, no confusion.  No unusual/severe anxiety, no depression    Past Medical History  Diagnosis Date  . Hypertension   . Hyperlipidemia   . Diverticulosis of colon   . Hx of colonic polyps 2007  . Syncope 2002  . Hematospermia   . Erectile dysfunction due to arterial insufficiency     Past Surgical History  Procedure Laterality Date  .  Colonoscopy w/ polypectomy  2007 & 2012    Dr.Perry ; hyperplastic  . Appendectomy  1992  . Inguinal hernia repair  2009    bilateral  . Mandible fracture surgery  1964    Social History   Social History  . Marital Status: Married    Spouse Name: N/A  . Number of Children: 2  . 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 Smoking status: Former Smoker    Quit date: 07/12/1980  . Smokeless tobacco: Never Used     Comment: smoked 1959-1982, up to 1/2 ppd  . Alcohol Use: 14.4 oz/week    24 Cans of beer per week     Comment:  socially  . Drug Use: No  . Sexual Activity: Not on file   Other Topics Concern  . Not on file   Social History Narrative   Household pt and wife   One child in [place], another in [place]     Family History  Problem Relation Age of Onset  . Heart failure Father   . Hypertension Father   . Hypertension Mother   . Alzheimer's disease Mother   . Cancer Mother     GYN  . Diverticulitis Brother   . Diabetes Sister   . Colon cancer Neg Hx   . Stomach cancer Neg Hx   . Rectal cancer Neg Hx   . Stroke Neg Hx   .  Heart attack Brother 29  . Prostate cancer Neg Hx        Medication List       This list is accurate as of: 11/26/15 11:59 PM.  Always use your most recent med list.               acetaminophen 500 MG tablet  Commonly known as:  TYLENOL  Take 500 mg by mouth every 6 (six) hours as needed for pain. For headache pain     aspirin 81 MG tablet  Take 81 mg by mouth daily.     atorvastatin 20 MG tablet  Commonly known as:  LIPITOR  Take 1 tablet (20 mg total) by mouth daily.     Fish Oil 1000 MG Caps  Take 1,000 mg by mouth daily.     hydrochlorothiazide 25 MG tablet  Commonly known as:  HYDRODIURIL  Take 1 tablet (25 mg total) by mouth daily.     losartan 100 MG tablet  Commonly known as:  COZAAR  Take 1 tablet (100 mg total) by mouth daily.      multivitamin tablet  Take 1 tablet by mouth daily.           Objective:   Physical Exam BP 120/64 mmHg  Pulse 62  Temp(Src) 97.6 F (36.4 C) (Oral)  Ht 6\' 3"  (1.905 m)  Wt 236 lb 8 oz (107.276 kg)  BMI 29.56 kg/m2  SpO2 94%  General:   Well developed, well nourished . NAD.  Neck: No  thyromegaly  HEENT:  Normocephalic . Face symmetric, atraumatic Lungs:  CTA B Normal respiratory effort, no intercostal retractions, no accessory muscle use. Heart: RRR,  no murmur.  No pretibial edema bilaterally  Abdomen:  Not distended, soft, non-tender. No rebound or rigidity.   Skin: Has a superficial, 4 cm cut at the right pretibial area, partially healed, no discharge, swelling. Neurologic:  alert & oriented X3.  Speech normal, gait appropriate for age and unassisted Strength symmetric and appropriate for age.  Psych: Cognition and judgment appear intact.  Cooperative with normal attention span and concentration.  Behavior appropriate. No anxious or depressed appearing.    Assessment & Plan:   Assessment> Prediabetes HTN dc amlodipine 05-2015 dt edema Hyperlipidemia Hematospermia 1 ( 12-2014) -- saw urology 09-2015, RTC prn Syncope 2002 Skin cancer   PLAN Prediabetes: Diet and exercise discussed, check up A1c HTN: Continue HCTZ, losartan. Check a BMP High cholesterol: Continue Lipitor, check a AST, ALT, FLP. Hematospermia: Saw urology, per note  RTC prn, pt is asx  RTC 8 months.

## 2015-11-27 LAB — ALT: ALT: 24 U/L (ref 0–53)

## 2015-11-27 LAB — BASIC METABOLIC PANEL
BUN: 10 mg/dL (ref 6–23)
CHLORIDE: 102 meq/L (ref 96–112)
CO2: 30 mEq/L (ref 19–32)
CREATININE: 0.73 mg/dL (ref 0.40–1.50)
Calcium: 9.3 mg/dL (ref 8.4–10.5)
GFR: 111.78 mL/min (ref >60.00)
Glucose, Bld: 92 mg/dL (ref 70–99)
POTASSIUM: 4 meq/L (ref 3.5–5.1)
Sodium: 139 mEq/L (ref 135–145)

## 2015-11-27 LAB — LIPID PANEL
Cholesterol: 161 mg/dL (ref 0–200)
HDL: 33.3 mg/dL — ABNORMAL LOW (ref >39.00)
LDL Cholesterol: 92 mg/dL (ref 0–99)
NonHDL: 127.29
TRIGLYCERIDES: 174 mg/dL — AB (ref 0.0–149.0)
Total CHOL/HDL Ratio: 5
VLDL: 34.8 mg/dL (ref 0.0–40.0)

## 2015-11-27 LAB — HEMOGLOBIN A1C: Hgb A1c MFr Bld: 6 % (ref 4.6–6.5)

## 2015-11-27 LAB — AST: AST: 22 U/L (ref 0–37)

## 2015-11-27 NOTE — Assessment & Plan Note (Signed)
Prediabetes: Diet and exercise discussed, check up A1c HTN: Continue HCTZ, losartan. Check a BMP High cholesterol: Continue Lipitor, check a AST, ALT, FLP. Hematospermia: Saw urology, per note  RTC prn, pt is asx  RTC 8 months.

## 2015-12-15 ENCOUNTER — Other Ambulatory Visit: Payer: Self-pay | Admitting: Internal Medicine

## 2016-01-20 ENCOUNTER — Telehealth: Payer: Self-pay | Admitting: Internal Medicine

## 2016-01-20 MED ORDER — CYCLOBENZAPRINE HCL 10 MG PO TABS: 10.0000 mg | ORAL_TABLET | Freq: Two times a day (BID) | ORAL | Status: DC | PRN

## 2016-01-20 NOTE — Telephone Encounter (Signed)
Spoke w/ Pt, he is not having any lower extremity tingling, numbness, has not seen rash anywhere. Informed him that I have sent Flexeril 10 mg, instructed him how to take medication, that it can make him drowsy. Instructed him that if he develops worsening Sx's or not better in several days that he will need to be seen. Pt verbalized understanding.

## 2016-01-20 NOTE — Telephone Encounter (Signed)
Please advise 

## 2016-01-20 NOTE — Telephone Encounter (Signed)
Advise patient: If severe symptoms, any lower extremity tingling or numbness, rash, fever chills or not better in 10 days --->   Needs to be seen. Otherwise call Flexeril 10 mg one tablet twice a day as needed for muscle spasms. #20, no refills.

## 2016-01-20 NOTE — Telephone Encounter (Signed)
Caller name: Relationship to patient: Self Can be reached: [phone] Pharmacy:  [facility], [place] [phone] ([phone] (Fax)       Reason for call: Patient called in requesting that a Rx for a Muscle Relaxer be called in to his pharmacy because he thinks he pulled a muscle in his back. Patient was offered an appt with a PA today but declined stating he just needs a muscle relaxer called in. Plse adv

## 2016-03-17 ENCOUNTER — Other Ambulatory Visit: Payer: Self-pay | Admitting: Internal Medicine

## 2016-05-08 ENCOUNTER — Other Ambulatory Visit: Payer: Self-pay | Admitting: Internal Medicine

## 2016-07-28 ENCOUNTER — Ambulatory Visit: Payer: Self-pay | Admitting: Internal Medicine

## 2016-08-16 ENCOUNTER — Other Ambulatory Visit: Payer: Self-pay | Admitting: Internal Medicine

## 2016-08-25 ENCOUNTER — Ambulatory Visit (INDEPENDENT_AMBULATORY_CARE_PROVIDER_SITE_OTHER): Payer: PPO | Admitting: Internal Medicine

## 2016-08-25 ENCOUNTER — Encounter: Payer: Self-pay | Admitting: Internal Medicine

## 2016-08-25 VITALS — BP 132/76 | HR 66 | Temp 97.5°F | Resp 14 | Ht 75.0 in | Wt 239.4 lb

## 2016-08-25 DIAGNOSIS — I10 Essential (primary) hypertension: Secondary | ICD-10-CM

## 2016-08-25 DIAGNOSIS — Z1159 Encounter for screening for other viral diseases: Secondary | ICD-10-CM | POA: Diagnosis not present

## 2016-08-25 DIAGNOSIS — E785 Hyperlipidemia, unspecified: Secondary | ICD-10-CM | POA: Diagnosis not present

## 2016-08-25 DIAGNOSIS — L821 Other seborrheic keratosis: Secondary | ICD-10-CM

## 2016-08-25 NOTE — Progress Notes (Signed)
Subjective:    Patient ID: Nicholas Burgess, male    DOB: 1941-05-10, 76 y.o.   MRN: [id number]  DOS:  08/25/2016 Type of visit - description : rov Interval history: Since the last visit he is feeling well. Good medication compliance, ambulatory BPs in the 130s. Knee pain, status post local injection, feeling better. Interested on the hep C screening. Somewhat concerned  about a skin lesion behind the right year. Is hard for him to see but on palpation it has not change in a while.   Review of Systems Had the flu few days ago, mild fever, nose congestion, cough: Overall feeling much better. Mild residual cough.  Past Medical History:  Diagnosis Date  . Diverticulosis of colon   . Erectile dysfunction due to arterial insufficiency   . Hematospermia   . Hx of colonic polyps 2007  . Hyperlipidemia   . Hypertension   . Syncope 2002    Past Surgical History:  Procedure Laterality Date  . APPENDECTOMY  1992  . COLONOSCOPY W/ POLYPECTOMY  2007 & 2012   Dr.Perry ; hyperplastic  . INGUINAL HERNIA REPAIR  2009   bilateral  . [place]    Social History   Social History  . Marital status: Married    Spouse name: N/A  . Number of children: 2  . Years of education: N/A   Occupational History  . retired [employer]   Social History Main Topics  . Smoking status: Former Smoker    Quit date: 07/12/1980  . Smokeless tobacco: Never Used     Comment: smoked 1959-1982, up to 1/2 ppd  . Alcohol use 14.4 oz/week    24 Cans of beer per week     Comment:  socially  . Drug use: No  . Sexual activity: Not on file   Other Topics Concern  . Not on file   Social History Narrative   Household pt and wife   One child in [place], another in [place] as of 08/25/2016   No Known Allergies     Medication List       Accurate as of 08/25/16 11:59 PM. Always use your most recent med list.          acetaminophen 500 MG  tablet Commonly known as:  TYLENOL Take 500 mg by mouth every 6 (six) hours as needed for pain. For headache pain   aspirin 81 MG tablet Take 81 mg by mouth daily.   atorvastatin 20 MG tablet Commonly known as:  LIPITOR Take 1 tablet (20 mg total) by mouth daily.   Fish Oil 1000 MG Caps Take 1,000 mg by mouth daily.   hydrochlorothiazide 25 MG tablet Commonly known as:  HYDRODIURIL Take 1 tablet (25 mg total) by mouth daily.   losartan 100 MG tablet Commonly known as:  COZAAR Take 1 tablet (100 mg total) by mouth daily.   multivitamin tablet Take 1 tablet by mouth daily.          Objective:   Physical Exam BP 132/76 (BP Location: Left Arm, Patient Position: Sitting, Cuff Size: Normal)   Pulse 66   Temp 97.5 F (36.4 C) (Oral)   Resp 14   Ht 6\' 3"  (1.905 m)   Wt 239 lb 6 oz (108.6 kg)   SpO2 96%   BMI 29.92 kg/m  General:   Well developed, well nourished . NAD.  HEENT:  Normocephalic .  Face symmetric, atraumatic Lungs:  CTA B Normal respiratory effort, no intercostal retractions, no accessory muscle use. Heart: RRR,  no murmur.  No pretibial edema bilaterally  Skin:  Behind the right ear has a although, 1 cm, well-demarcated, yellowish/brownish stuck-on type of lesion consistent with SK. Similar lesions in other parts of the face Neurologic:  alert & oriented X3.  Speech normal, gait appropriate for age and unassisted Psych--  Cognition and judgment appear intact.  Cooperative with normal attention span and concentration.  Behavior appropriate. No anxious or depressed appearing.      Assessment & Plan:   Assessment> Prediabetes HTN dc amlodipine 05-2015 dt edema Hyperlipidemia Hematospermia 1 ( 12-2014) -- saw urology 09-2015, RTC prn Syncope 2002 Skin cancer   PLAN HTN: Seems to be under excellent control with losartan, HCTZ. Check a BMP and CBC Hyperlipidemia: Controlled on Lipitor SK: Skin lesion consistent with SK, rec observation. Call if  there is any changes in size or color. Request a hep C screening. We will do Wife has mild-modertae dementia, patient had to quit working few weeks ago to take care of her, he is doing well emotionally but is less active physically. Recommend a 30 minute walk daily, he has a treadmill. RTC 6 months, CPX  .

## 2016-08-25 NOTE — Progress Notes (Signed)
Pre visit review using our clinic review tool, if applicable. No additional management support is needed unless otherwise documented below in the visit note. 

## 2016-08-25 NOTE — Patient Instructions (Signed)
GO TO THE LAB : Get the blood work     GO TO THE FRONT DESK Schedule your next appointment for a  Physical in 5 to 6 months

## 2016-08-26 DIAGNOSIS — L821 Other seborrheic keratosis: Secondary | ICD-10-CM | POA: Insufficient documentation

## 2016-08-26 LAB — CBC WITH DIFFERENTIAL/PLATELET
BASOS PCT: 0.8 % (ref 0.0–3.0)
Basophils Absolute: 0 10*3/uL (ref 0.0–0.1)
EOS PCT: 1.1 % (ref 0.0–5.0)
Eosinophils Absolute: 0 10*3/uL (ref 0.0–0.7)
HCT: 44.6 % (ref 39.0–52.0)
HEMOGLOBIN: 15.5 g/dL (ref 13.0–17.0)
LYMPHS ABS: 1.6 10*3/uL (ref 0.7–4.0)
Lymphocytes Relative: 43.2 % (ref 12.0–46.0)
MCHC: 34.7 g/dL (ref 30.0–36.0)
MCV: 90.2 fl (ref 78.0–100.0)
MONO ABS: 0.5 10*3/uL (ref 0.1–1.0)
Monocytes Relative: 13.3 % — ABNORMAL HIGH (ref 3.0–12.0)
NEUTROS ABS: 1.6 10*3/uL (ref 1.4–7.7)
Neutrophils Relative %: 41.6 % — ABNORMAL LOW (ref 43.0–77.0)
PLATELETS: 158 10*3/uL (ref 150.0–400.0)
RBC: 4.95 Mil/uL (ref 4.22–5.81)
RDW: 13.4 % (ref 11.5–15.5)
WBC: 3.8 10*3/uL — ABNORMAL LOW (ref 4.0–10.5)

## 2016-08-26 LAB — BASIC METABOLIC PANEL
BUN: 9 mg/dL (ref 6–23)
CHLORIDE: 100 meq/L (ref 96–112)
CO2: 30 meq/L (ref 19–32)
CREATININE: 0.68 mg/dL (ref 0.40–1.50)
Calcium: 9.2 mg/dL (ref 8.4–10.5)
GFR: 121.07 mL/min (ref >60.00)
GLUCOSE: 101 mg/dL — AB (ref 70–99)
Potassium: 3.6 mEq/L (ref 3.5–5.1)
Sodium: 137 mEq/L (ref 135–145)

## 2016-08-26 LAB — HEPATITIS C ANTIBODY: HCV Ab: NEGATIVE

## 2016-08-26 NOTE — Assessment & Plan Note (Signed)
HTN: Seems to be under excellent control with losartan, HCTZ. Check a BMP and CBC Hyperlipidemia: Controlled on Lipitor SK: Skin lesion consistent with SK, rec observation. Call if there is any changes in size or color. Request a hep C screening. We will do Wife has mild-modertae dementia, patient had to quit working few weeks ago to take care of her, he is doing well emotionally but is less active physically. Recommend a 30 minute walk daily, he has a treadmill. RTC 6 months, CPX  .

## 2016-09-15 DIAGNOSIS — H5213 Myopia, bilateral: Secondary | ICD-10-CM | POA: Diagnosis not present

## 2016-09-15 DIAGNOSIS — H524 Presbyopia: Secondary | ICD-10-CM | POA: Diagnosis not present

## 2016-09-15 DIAGNOSIS — H2513 Age-related nuclear cataract, bilateral: Secondary | ICD-10-CM | POA: Diagnosis not present

## 2016-09-15 DIAGNOSIS — H52223 Regular astigmatism, bilateral: Secondary | ICD-10-CM | POA: Diagnosis not present

## 2016-09-15 DIAGNOSIS — H40013 Open angle with borderline findings, low risk, bilateral: Secondary | ICD-10-CM | POA: Diagnosis not present

## 2016-11-10 ENCOUNTER — Other Ambulatory Visit: Payer: Self-pay | Admitting: Internal Medicine

## 2016-11-18 DIAGNOSIS — H2513 Age-related nuclear cataract, bilateral: Secondary | ICD-10-CM | POA: Diagnosis not present

## 2016-11-18 DIAGNOSIS — H40013 Open angle with borderline findings, low risk, bilateral: Secondary | ICD-10-CM | POA: Diagnosis not present

## 2016-12-20 ENCOUNTER — Other Ambulatory Visit: Payer: Self-pay | Admitting: Internal Medicine

## 2017-01-31 ENCOUNTER — Other Ambulatory Visit: Payer: Self-pay | Admitting: Internal Medicine

## 2017-02-08 ENCOUNTER — Encounter: Payer: Self-pay | Admitting: Internal Medicine

## 2017-02-08 ENCOUNTER — Ambulatory Visit (INDEPENDENT_AMBULATORY_CARE_PROVIDER_SITE_OTHER): Payer: PPO | Admitting: Internal Medicine

## 2017-02-08 VITALS — BP 138/88 | HR 85 | Temp 97.5°F | Resp 14 | Ht 75.0 in | Wt 254.4 lb

## 2017-02-08 DIAGNOSIS — R635 Abnormal weight gain: Secondary | ICD-10-CM

## 2017-02-08 DIAGNOSIS — M7501 Adhesive capsulitis of right shoulder: Secondary | ICD-10-CM

## 2017-02-08 DIAGNOSIS — R21 Rash and other nonspecific skin eruption: Secondary | ICD-10-CM | POA: Diagnosis not present

## 2017-02-08 MED ORDER — HYDROCORTISONE 2.5 % EX CREA: TOPICAL_CREAM | Freq: Two times a day (BID) | CUTANEOUS | 0 refills | Status: DC

## 2017-02-08 NOTE — Patient Instructions (Addendum)
Apply the cream twice a day for a week  Referring you to physical therapy at the [facility]   If you are unable to do physical therapy let me know for a sports medicine referral

## 2017-02-08 NOTE — Progress Notes (Signed)
Subjective:    Patient ID: Nicholas Burgess, male    DOB: [birth year], 76 y.o.   MRN: [id number]  DOS:  02/08/2017 Type of visit - description : Acute visit Interval history: Noted a rash at the left leg about 3 weeks ago, denies any injury, not itching. Also, on and off right shoulder pain, mild, at the deltoid area. Has noted decrease in the range of motion. Frozen shoulder?.  I noted a 15 pound weight gain since the last visit. He retired, physical activity has decreased, admits to a poor diet mostly junk food because wife has  has dementia so neither can prep meals at home.   Review of Systems   Past Medical History:  Diagnosis Date  . Diverticulosis of colon   . Erectile dysfunction due to arterial insufficiency   . Hematospermia   . Hx of colonic polyps 2007  . Hyperlipidemia   . Hypertension   . Syncope 2002    Past Surgical History:  Procedure Laterality Date  . APPENDECTOMY  1992  . COLONOSCOPY W/ POLYPECTOMY  2007 & 2012   Dr.Perry ; hyperplastic  . INGUINAL HERNIA REPAIR  2009   bilateral  . [place]    Social History   Social History  . Marital status: Married    Spouse name: N/A  . Number of children: 2  . Years of education: N/A   Occupational History  . retired [employer]   Social History Main Topics  . Smoking status: Former Smoker    Quit date: 07/12/1980  . Smokeless tobacco: Never Used     Comment: smoked 1959-1982, up to 1/2 ppd  . Alcohol use 14.4 oz/week    24 Cans of beer per week     Comment:  socially  . Drug use: No  . Sexual activity: Not on file   Other Topics Concern  . Not on file   Social History Narrative   Household pt and wife   One child in [place], another in [place] as of 02/08/2017   No Known Allergies     Medication List       Accurate as of 02/08/17 11:59 PM. Always use your most recent med list.          acetaminophen 500 MG tablet Commonly  known as:  TYLENOL Take 500 mg by mouth every 6 (six) hours as needed for pain. For headache pain   aspirin 81 MG tablet Take 81 mg by mouth daily.   atorvastatin 20 MG tablet Commonly known as:  LIPITOR Take 1 tablet (20 mg total) by mouth daily.   Fish Oil 1000 MG Caps Take 1,000 mg by mouth daily.   hydrochlorothiazide 25 MG tablet Commonly known as:  HYDRODIURIL Take 1 tablet (25 mg total) by mouth daily.   hydrocortisone 2.5 % cream Apply topically 2 (two) times daily.   losartan 100 MG tablet Commonly known as:  COZAAR Take 1 tablet (100 mg total) by mouth daily.   multivitamin tablet Take 1 tablet by mouth daily.          Objective:   Physical Exam BP 138/88 (BP Location: Left Arm, Patient Position: Sitting, Cuff Size: Normal)   Pulse 85   Temp (!) 97.5 F (36.4 C) (Oral)   Resp 14   Ht 6\' 3"  (1.905 m)   Wt 254 lb 6 oz (115.4 kg)   SpO2 96%   BMI  31.79 kg/m  General:   Well developed, well nourished . NAD.  HEENT:  Normocephalic . Face symmetric, atraumatic MSK: Left shoulder normal Right shoulder: No deformities, range of motion decreased to active or pasive arm elevation. Strength is otherwise normal in the upper extremities. Neck: Full range of motion. Skin: Has a irregular shaped, 47 cm macular spot. Ecchymoses?. Some evidence of scratching.  Neurologic:  alert & oriented X3.  Speech normal, gait appropriate for age and unassisted Psych--  Cognition and judgment appear intact.  Cooperative with normal attention span and concentration.  Behavior appropriate. No anxious or depressed appearing.      Assessment & Plan:    Assessment> Prediabetes HTN dc amlodipine 05-2015 dt edema Hyperlipidemia Hematospermia 1 ( 12-2014) -- saw urology 09-2015, RTC prn Syncope 2002 Skin cancer   PLAN Shoulder pain, with evidence of decreased ROM, refer to physical therapy to prevent a frozen shoulder. If no better or unable to do PT, will call for a  referral. Rash: Unclear etiology, topical steroids x few days, call if no better Weight gain: Patient retired recently, less active even though he is walking the treadmill few times a week, not eating healthy. Extensive discussion about diet. RTC as already plan in few weeks.     Marland Kitchen

## 2017-02-08 NOTE — Progress Notes (Signed)
Pre visit review using our clinic review tool, if applicable. No additional management support is needed unless otherwise documented below in the visit note. 

## 2017-02-09 ENCOUNTER — Ambulatory Visit (INDEPENDENT_AMBULATORY_CARE_PROVIDER_SITE_OTHER): Payer: PPO | Admitting: Physical Therapy

## 2017-02-09 DIAGNOSIS — M25511 Pain in right shoulder: Secondary | ICD-10-CM

## 2017-02-09 DIAGNOSIS — M25611 Stiffness of right shoulder, not elsewhere classified: Secondary | ICD-10-CM

## 2017-02-09 DIAGNOSIS — R293 Abnormal posture: Secondary | ICD-10-CM

## 2017-02-09 NOTE — Patient Instructions (Signed)
   SHOULDER: External Rotation - Supine (Cane)   Hold cane with both hands. Rotate arm away from body. Keep elbow on floor and next to body. _10-15__ reps per set, _2-3__ sets per day, __7_ days per week Add towel to keep elbow at side.    Cane Exercise: Flexion   Lie on back, holding cane above chest. Keeping arms as straight as possible, lower cane toward floor beyond head. Hold __1-2__ seconds. Repeat ___10-15_ times. Do _2-3___ sessions per day.  Cane Exercise: Abduction    Hold cane with right hand over end, palm-up, with other hand palm-down. Move arm out from side and up by pushing with other arm. Hold __1-2__ seconds. Repeat __10-15__ times. Do _2-3___ sessions per day.

## 2017-02-09 NOTE — Therapy (Signed)
[facility] [address] [place], [place], [address] Phone: [phone]   Fax:  [phone]  Physical Therapy Evaluation  Patient Details  Name: Nicholas Burgess MRN: [id number] Date of Birth: April 07, 1941 Referring Provider: Dr. Kathlene November  Encounter Date: 02/09/2017      PT End of Session - 02/09/17 0939    Visit Number 1   Number of Visits 12   Date for PT Re-Evaluation 03/23/17   PT Start Time 0903   PT Stop Time 0939   PT Time Calculation (min) 36 min   Activity Tolerance Patient tolerated treatment well   Behavior During Therapy [facility] for tasks assessed/performed      Past Medical History:  Diagnosis Date  . Diverticulosis of colon   . Erectile dysfunction due to arterial insufficiency   . Hematospermia   . Hx of colonic polyps 2007  . Hyperlipidemia   . Hypertension   . Syncope 2002    Past Surgical History:  Procedure Laterality Date  . APPENDECTOMY  1992  . COLONOSCOPY W/ POLYPECTOMY  2007 & 2012   Dr.Perry ; hyperplastic  . INGUINAL HERNIA REPAIR  2009   bilateral  . MANDIBLE FRACTURE SURGERY  1964    There were no vitals filed for this visit.       Subjective Assessment - 02/09/17 0907    Subjective Pt is a 76 y/o male who presents to OPPT with ~ 2 month history of Rt arm stiffness and pain with movement.  Pt reports no known cause for injury.     Limitations Lifting;House hold activities   Diagnostic tests n/a   Patient Stated Goals improve pain and motion   Currently in Pain? Yes   Pain Score 0-No pain  up to 5/10 with activity   Pain Location Shoulder   Pain Orientation Right   Pain Descriptors / Indicators Sore   Pain Type Acute pain;Chronic pain   Pain Onset More than a month ago   Pain Frequency Intermittent   Aggravating Factors  abduction   Pain Relieving Factors immobilization            [facility] PT Assessment - 02/09/17 0910      Assessment   Medical Diagnosis Rt adhesive capsulitis   Referring Provider Dr. Kathlene November   Onset Date/Surgical Date 12/10/16   Hand Dominance Right   Next MD Visit 03/28/17   Prior Therapy none     Precautions   Precautions None     Restrictions   Weight Bearing Restrictions No     Balance Screen   Has the patient fallen in the past 6 months No   Has the patient had a decrease in activity level because of a fear of falling?  No   Is the patient reluctant to leave their home because of a fear of falling?  No     Home Environment   Living Environment Private residence   Living Arrangements Spouse/significant other   Type of [place]     Prior Function   Level of [place] Retired   Biomedical scientist retired from [employer]   Leisure watch TV; "I don't do nothing now that I'm retired"     Posture/Postural Control   Posture/Postural Control Postural limitations   Postural Limitations Rounded Shoulders;Forward head;Increased thoracic kyphosis     ROM / Strength   AROM / PROM / Strength AROM;Strength;PROM     AROM   AROM Assessment Site Shoulder  Right/Left Shoulder Right   Right Shoulder Flexion 92 Degrees   Right Shoulder ABduction 92 Degrees   Right Shoulder Internal Rotation 35 Degrees  FIR to L1/2   Right Shoulder External Rotation 41 Degrees     PROM   PROM Assessment Site Shoulder   Right/Left Shoulder Right   Right Shoulder Flexion 105 Degrees   Right Shoulder ABduction 100 Degrees   Right Shoulder Internal Rotation 45 Degrees   Right Shoulder External Rotation 50 Degrees     Strength   Overall Strength Comments deferred at this time due to ROM limitations; but grossly 3-/5 all Rt shoulder motions   Strength Assessment Site --   Right/Left Shoulder --     Special Tests    Special Tests Rotator Cuff Impingement   Rotator Cuff Impingment tests Michel Bickers test     Hawkins-Kennedy test   Findings Negative   Side Right            Objective measurements  completed on examination: See above findings.          [facility] Adult PT Treatment/Exercise - 02/09/17 0910      Exercises   Exercises Shoulder     Shoulder Exercises: Supine   External Rotation AAROM;Right;5 reps   External Rotation Limitations cane   Flexion AAROM;Both;5 reps   Flexion Limitations cane     Shoulder Exercises: Seated   Abduction AAROM;Right;5 reps   ABduction Limitations cane                PT Education - 02/09/17 0938    Education provided Yes   Education Details HEP-AA with cane   Person(s) Educated Patient   Methods Explanation;Demonstration;Handout   Comprehension Verbalized understanding;Returned demonstration;Need further instruction             PT Long Term Goals - 02/09/17 0947      PT LONG TERM GOAL #1   Title independent with HEP    Time 6   Period Weeks   Status New   Target Date 03/23/17     PT LONG TERM GOAL #2   Title improve Rt shoulder functional internal rotation to at least T9 for improved function    Time 6   Period Weeks   Status New   Target Date 03/23/17     PT LONG TERM GOAL #3   Title improve Rt shoulder flexion and abduction AROM to at least 110 for improved mobility   Time 6   Period Weeks   Status New   Target Date 03/23/17     PT LONG TERM GOAL #4   Title verbalize understanding of posture/body mechanics to decrease risk of reinjury   Time 6   Period Weeks   Status New   Target Date 03/23/17                Plan - 02/09/17 0941    Clinical Impression Statement Pt is a 76 y/o male who presents to OPPT for Rt shoulder pain and stiffness.  Clinical findings consistent with Rt shoulder adhesive caspulitis.  Pt demonstrates poor postural awareness, decreased strength and ROM affecting functional mobiltity and ADLs.  Pt will benefit from PT to address deficits listed.   History and Personal Factors relevant to plan of care: HTN, HLD   Clinical Presentation Stable   Clinical Decision Making Low    Rehab Potential Good   PT Frequency 2x / week   PT Duration 6 weeks   PT Treatment/Interventions ADLs/Self Care  Home Management;Cryotherapy;Electrical Stimulation;Moist Heat;Ultrasound;Therapeutic exercise;Therapeutic activities;Functional mobility training;Patient/family education;Manual techniques;Vasopneumatic Device;Taping;Dry needling;Passive range of motion   PT Next Visit Plan AAROM exercises, manual for ROM, postural exercises   Consulted and Agree with Plan of Care Patient      Patient will benefit from skilled therapeutic intervention in order to improve the following deficits and impairments:  Pain, Impaired UE functional use, Postural dysfunction, Decreased range of motion, Decreased strength  Visit Diagnosis: Right shoulder pain, unspecified chronicity - Plan: PT plan of care cert/re-cert  Stiffness of right shoulder, not elsewhere classified - Plan: PT plan of care cert/re-cert  Abnormal posture - Plan: PT plan of care cert/re-cert      [facility] PT PB G-CODES - 02-16-17 0949    Functional Assessment Tool Used  clinical judgement-decreased ROM affecting ADLs ~ 20%   Functional Limitations Self care   Self Care Current Status (Y5183) At least 20 percent but less than 40 percent impaired, limited or restricted   Self Care Goal Status (F5825) At least 1 percent but less than 20 percent impaired, limited or restricted       Problem List Patient Active Problem List   Diagnosis Date Noted  . Seborrheic keratoses 08/26/2016  . Follow-up -------- PCP NOTES 03/18/2015  . Hematospermia 12/13/2014  . Annual physical exam 09/14/2013  . LOW BACK PAIN SYNDROME 01/19/2010  . Hyperlipidemia 04/15/2009  . COLONIC POLYPS, HX OF 04/15/2009  . ARTHRALGIA 11/04/2008  . Essential hypertension 04/04/2008  . DIVERTICULOSIS, COLON 04/04/2008  . Prediabetes 04/04/2008  . DIVERTICULITIS, HX OF 04/04/2008  . CALCANEAL SPUR 06/12/2007       Laureen Abrahams, PT, DPT 02-16-17  9:53 AM     [facility] [place], [place], [address] Phone: [phone]   Fax:  [phone]  Name: Nicholas Burgess MRN: [id number] Date of Birth: Jun 13, 1941

## 2017-02-09 NOTE — Assessment & Plan Note (Signed)
Shoulder pain, with evidence of decreased ROM, refer to physical therapy to prevent a frozen shoulder. If no better or unable to do PT, will call for a referral. Rash: Unclear etiology, topical steroids x few days, call if no better Weight gain: Patient retired recently, less active even though he is walking the treadmill few times a week, not eating healthy. Extensive discussion about diet. RTC as already plan in few weeks.     Marland Kitchen

## 2017-02-15 ENCOUNTER — Ambulatory Visit (INDEPENDENT_AMBULATORY_CARE_PROVIDER_SITE_OTHER): Payer: PPO | Admitting: Physical Therapy

## 2017-02-15 DIAGNOSIS — M25611 Stiffness of right shoulder, not elsewhere classified: Secondary | ICD-10-CM | POA: Diagnosis not present

## 2017-02-15 DIAGNOSIS — R293 Abnormal posture: Secondary | ICD-10-CM

## 2017-02-15 DIAGNOSIS — M25511 Pain in right shoulder: Secondary | ICD-10-CM

## 2017-02-15 NOTE — Patient Instructions (Signed)
ROM: Towel Stretch - with [employer]    Pull rightt arm up behind back by pulling towel up with other arm. Hold __20__ seconds. Repeat __3__ times per set. Do __1__ sets per session. Do __2-3__ sessions per day.   Scapular Retraction (Standing)    With arms at sides, pinch shoulder blades together.  Elbows to your back pockets.  Hold for 3 seconds. Repeat __10__ times per set. Do _1-2___ sets per session. Do __2-3__ sessions per day.

## 2017-02-15 NOTE — Therapy (Signed)
[facility] [address] [place], [place], [address] Phone: [phone]   Fax:  [phone]  Physical Therapy Treatment  Patient Details  Name: Nicholas Burgess MRN: [id number] Date of Birth: Aug 13, 1940 Referring Provider: Dr. Kathlene November  Encounter Date: 02/15/2017      PT End of Session - 02/15/17 0843    Visit Number 2   Number of Visits 12   Date for PT Re-Evaluation 03/23/17   PT Start Time 0803   PT Stop Time 0843   PT Time Calculation (min) 40 min   Activity Tolerance Patient tolerated treatment well   Behavior During Therapy [facility] for tasks assessed/performed      Past Medical History:  Diagnosis Date  . Diverticulosis of colon   . Erectile dysfunction due to arterial insufficiency   . Hematospermia   . Hx of colonic polyps 2007  . Hyperlipidemia   . Hypertension   . Syncope 2002    Past Surgical History:  Procedure Laterality Date  . APPENDECTOMY  1992  . COLONOSCOPY W/ POLYPECTOMY  2007 & 2012   Dr.Perry ; hyperplastic  . INGUINAL HERNIA REPAIR  2009   bilateral  . MANDIBLE FRACTURE SURGERY  1964    There were no vitals filed for this visit.      Subjective Assessment - 02/15/17 0805    Subjective still having some stiffness, and arm is still a little painful.  doing exercises everyday, "I can't tell a whole lot of difference, maybe a little."   Limitations Lifting;House hold activities   Patient Stated Goals improve pain and motion   Pain Score 0-No pain                         [facility] Adult PT Treatment/Exercise - 02/15/17 0806      Shoulder Exercises: Supine   External Rotation AAROM;Right;20 reps   External Rotation Limitations cane   Flexion AAROM;Both;20 reps   Flexion Limitations cane     Shoulder Exercises: Seated   Abduction AAROM;Right;20 reps   ABduction Limitations cane     Shoulder Exercises: Standing   Retraction Both;20 reps   Retraction Limitations with mini squat  and 3 sec hold     Shoulder Exercises: Stretch   Internal Rotation Stretch 3 reps   Internal Rotation Stretch Limitations 20 sec; Right     Manual Therapy   Manual Therapy Joint mobilization;Passive ROM   Manual therapy comments pt supine   Joint Mobilization Rt shoulder inf and A/P grades 2-3   Passive ROM Rt shoulder all motions                PT Education - 02/15/17 0842    Education provided Yes   Education Details added to HEP: ir stretch and scap retraction   Person(s) Educated Patient   Methods Explanation;Demonstration;Handout   Comprehension Verbalized understanding;Returned demonstration;Need further instruction             PT Long Term Goals - 02/09/17 0947      PT LONG TERM GOAL #1   Title independent with HEP    Time 6   Period Weeks   Status New   Target Date 03/23/17     PT LONG TERM GOAL #2   Title improve Rt shoulder functional internal rotation to at least T9 for improved function    Time 6   Period Weeks   Status New   Target Date 03/23/17  PT LONG TERM GOAL #3   Title improve Rt shoulder flexion and abduction AROM to at least 110 for improved mobility   Time 6   Period Weeks   Status New   Target Date 03/23/17     PT LONG TERM GOAL #4   Title verbalize understanding of posture/body mechanics to decrease risk of reinjury   Time 6   Period Weeks   Status New   Target Date 03/23/17               Plan - 02/15/17 0843    Clinical Impression Statement Pt tolerated session well, needing min cues for proper technique with current HEP.  Overall compliant with HEP, with minimal ROM improvements noted at this time but only 2nd visit.  Will continue to benefit from PT to maximize function.   PT Treatment/Interventions ADLs/Self Care Home Management;Cryotherapy;Electrical Stimulation;Moist Heat;Ultrasound;Therapeutic exercise;Therapeutic activities;Functional mobility training;Patient/family education;Manual  techniques;Vasopneumatic Device;Taping;Dry needling;Passive range of motion   PT Next Visit Plan AAROM exercises, manual for ROM, postural exercises   Consulted and Agree with Plan of Care Patient      Patient will benefit from skilled therapeutic intervention in order to improve the following deficits and impairments:  Pain, Impaired UE functional use, Postural dysfunction, Decreased range of motion, Decreased strength  Visit Diagnosis: Right shoulder pain, unspecified chronicity  Stiffness of right shoulder, not elsewhere classified  Abnormal posture     Problem List Patient Active Problem List   Diagnosis Date Noted  . Seborrheic keratoses 08/26/2016  . Follow-up -------- PCP NOTES 03/18/2015  . Hematospermia 12/13/2014  . Annual physical exam 09/14/2013  . LOW BACK PAIN SYNDROME 01/19/2010  . Hyperlipidemia 04/15/2009  . COLONIC POLYPS, HX OF 04/15/2009  . ARTHRALGIA 11/04/2008  . Essential hypertension 04/04/2008  . DIVERTICULOSIS, COLON 04/04/2008  . Prediabetes 04/04/2008  . DIVERTICULITIS, HX OF 04/04/2008  . CALCANEAL SPUR 06/12/2007      Laureen Abrahams, PT, DPT 02/15/17 8:44 AM    [facility] [place], [place], [address] Phone: [phone]   Fax:  [phone]  Name: RAIHAN KIMMEL MRN: [id number] Date of Birth: 11-07-1940

## 2017-02-17 ENCOUNTER — Ambulatory Visit (INDEPENDENT_AMBULATORY_CARE_PROVIDER_SITE_OTHER): Payer: PPO | Admitting: Physical Therapy

## 2017-02-17 DIAGNOSIS — R293 Abnormal posture: Secondary | ICD-10-CM

## 2017-02-17 DIAGNOSIS — M25611 Stiffness of right shoulder, not elsewhere classified: Secondary | ICD-10-CM

## 2017-02-17 DIAGNOSIS — M25511 Pain in right shoulder: Secondary | ICD-10-CM | POA: Diagnosis not present

## 2017-02-17 NOTE — Therapy (Signed)
[facility] [address] [place], [place], [address] Phone: [phone]   Fax:  [phone]  Physical Therapy Treatment  Patient Details  Name: Nicholas Burgess MRN: [id number] Date of Birth: 04/22/1941 Referring Provider: Dr. Kathlene November  Encounter Date: 02/17/2017      PT End of Session - 02/17/17 0841    Visit Number 3   Number of Visits 12   Date for PT Re-Evaluation 03/23/17   PT Start Time 0803   PT Stop Time 0841   PT Time Calculation (min) 38 min   Activity Tolerance Patient tolerated treatment well   Behavior During Therapy [facility] for tasks assessed/performed      Past Medical History:  Diagnosis Date  . Diverticulosis of colon   . Erectile dysfunction due to arterial insufficiency   . Hematospermia   . Hx of colonic polyps 2007  . Hyperlipidemia   . Hypertension   . Syncope 2002    Past Surgical History:  Procedure Laterality Date  . APPENDECTOMY  1992  . COLONOSCOPY W/ POLYPECTOMY  2007 & 2012   Dr.Perry ; hyperplastic  . INGUINAL HERNIA REPAIR  2009   bilateral  . MANDIBLE FRACTURE SURGERY  1964    There were no vitals filed for this visit.      Subjective Assessment - 02/17/17 0823    Subjective shoulder is still stiff; but doing well.   Patient Stated Goals improve pain and motion   Currently in Pain? No/denies            [facility] PT Assessment - 02/17/17 0823      PROM   Right Shoulder ABduction 112 Degrees                     [facility] Adult PT Treatment/Exercise - 02/17/17 0823      Shoulder Exercises: Standing   Protraction Right;10 reps;Theraband   Theraband Level (Shoulder Protraction) Level 2 (Red)   External Rotation Right;10 reps;Theraband   Theraband Level (Shoulder External Rotation) Level 2 (Red)   Internal Rotation Right;10 reps;Theraband   Theraband Level (Shoulder Internal Rotation) Level 2 (Red)   Flexion AAROM;Right;10 reps   Flexion Limitations UE ranger and wall  ladder   ABduction AAROM;Right;10 reps   ABduction Limitations scapular plane; UE ranger and wall ladder   Retraction Both;10 reps;Theraband   Theraband Level (Shoulder Retraction) Level 2 (Red)     Shoulder Exercises: Stretch   Internal Rotation Stretch 3 reps   Internal Rotation Stretch Limitations 20 sec; Right     Manual Therapy   Manual Therapy Joint mobilization;Passive ROM   Manual therapy comments pt supine   Joint Mobilization Rt shoulder inf and A/P grades 2-3   Passive ROM Rt shoulder all motions                     PT Long Term Goals - 02/09/17 0947      PT LONG TERM GOAL #1   Title independent with HEP    Time 6   Period Weeks   Status New   Target Date 03/23/17     PT LONG TERM GOAL #2   Title improve Rt shoulder functional internal rotation to at least T9 for improved function    Time 6   Period Weeks   Status New   Target Date 03/23/17     PT LONG TERM GOAL #3   Title improve Rt shoulder flexion and abduction AROM to at  least 110 for improved mobility   Time 6   Period Weeks   Status New   Target Date 03/23/17     PT LONG TERM GOAL #4   Title verbalize understanding of posture/body mechanics to decrease risk of reinjury   Time 6   Period Weeks   Status New   Target Date 03/23/17               Plan - 02/17/17 0841    Clinical Impression Statement Rt shoulder PROM abduction improved to 112 degrees today, and tolerated increased strengthening exercises well today.  Reports min soreness after session, so recommended to ice shoulder if soreness persists.  Pt progressing well with PT.   PT Treatment/Interventions ADLs/Self Care Home Management;Cryotherapy;Electrical Stimulation;Moist Heat;Ultrasound;Therapeutic exercise;Therapeutic activities;Functional mobility training;Patient/family education;Manual techniques;Vasopneumatic Device;Taping;Dry needling;Passive range of motion   PT Next Visit Plan AAROM exercises, manual for ROM,  postural exercises   Consulted and Agree with Plan of Care Patient      Patient will benefit from skilled therapeutic intervention in order to improve the following deficits and impairments:  Pain, Impaired UE functional use, Postural dysfunction, Decreased range of motion, Decreased strength  Visit Diagnosis: Right shoulder pain, unspecified chronicity  Stiffness of right shoulder, not elsewhere classified  Abnormal posture     Problem List Patient Active Problem List   Diagnosis Date Noted  . Seborrheic keratoses 08/26/2016  . Follow-up -------- PCP NOTES 03/18/2015  . Hematospermia 12/13/2014  . Annual physical exam 09/14/2013  . LOW BACK PAIN SYNDROME 01/19/2010  . Hyperlipidemia 04/15/2009  . COLONIC POLYPS, HX OF 04/15/2009  . ARTHRALGIA 11/04/2008  . Essential hypertension 04/04/2008  . DIVERTICULOSIS, COLON 04/04/2008  . Prediabetes 04/04/2008  . DIVERTICULITIS, HX OF 04/04/2008  . CALCANEAL SPUR 06/12/2007      Laureen Abrahams, PT, DPT 02/17/17 8:45 AM    [place], [place], [address] Phone: [phone]   Fax:  [phone]  Name: Nicholas Burgess MRN: [id number] Date of Birth: 1940/12/23

## 2017-02-21 ENCOUNTER — Ambulatory Visit (INDEPENDENT_AMBULATORY_CARE_PROVIDER_SITE_OTHER): Payer: PPO | Admitting: Physical Therapy

## 2017-02-21 DIAGNOSIS — M25611 Stiffness of right shoulder, not elsewhere classified: Secondary | ICD-10-CM | POA: Diagnosis not present

## 2017-02-21 DIAGNOSIS — M25511 Pain in right shoulder: Secondary | ICD-10-CM | POA: Diagnosis not present

## 2017-02-21 NOTE — Therapy (Signed)
[facility] [address] [place], [place], [address] Phone: [phone]   Fax:  [phone]  Physical Therapy Treatment  Patient Details  Name: Nicholas Burgess MRN: [id number] Date of Birth: 03-Nov-1940 Referring Provider: Dr. Kathlene November  Encounter Date: 02/21/2017      PT End of Session - 02/21/17 1006    Visit Number 4   Number of Visits 12   Date for PT Re-Evaluation 03/23/17   PT Start Time 1006   PT Stop Time 1055   PT Time Calculation (min) 49 min   Activity Tolerance Patient tolerated treatment well   Behavior During Therapy [facility] for tasks assessed/performed      Past Medical History:  Diagnosis Date  . Diverticulosis of colon   . Erectile dysfunction due to arterial insufficiency   . Hematospermia   . Hx of colonic polyps 2007  . Hyperlipidemia   . Hypertension   . Syncope 2002    Past Surgical History:  Procedure Laterality Date  . APPENDECTOMY  1992  . COLONOSCOPY W/ POLYPECTOMY  2007 & 2012   Dr.Perry ; hyperplastic  . INGUINAL HERNIA REPAIR  2009   bilateral  . MANDIBLE FRACTURE SURGERY  1964    There were no vitals filed for this visit.      Subjective Assessment - 02/21/17 1016    Subjective  Pt states minimal pain at rest, but increased pain with activity. Stiffness continues to be bothersome.    Currently in Pain? Yes   Pain Score 5    Pain Location Shoulder   Pain Orientation Right   Pain Descriptors / Indicators Aching            [facility] PT Assessment - 02/21/17 1034      PROM   Right Shoulder Flexion 125 Degrees   Right Shoulder ABduction 115 Degrees   Right Shoulder Internal Rotation 55 Degrees   Right Shoulder External Rotation 55 Degrees                     [facility] Adult PT Treatment/Exercise - 02/21/17 1020      Shoulder Exercises: Supine   External Rotation AAROM;Right;20 reps   External Rotation Limitations cane   Flexion AAROM;Both;20 reps   Flexion Limitations  cane and with PBall Rolls on table      Shoulder Exercises: Standing   Protraction Right;Theraband;20 reps   Theraband Level (Shoulder Protraction) Level 2 (Red)   Flexion AAROM;Right;15 reps   Flexion Limitations UE ranger and wall ladder     Shoulder Exercises: Stretch   Internal Rotation Stretch 3 reps   Internal Rotation Stretch Limitations 10 sec; Right  Behind back with 2 hands     Modalities   Modalities Cryotherapy     Cryotherapy   Cryotherapy Location Shoulder   Type of Cryotherapy Ice pack     Manual Therapy   Manual Therapy Joint mobilization;Passive ROM   Manual therapy comments pt supine   Joint Mobilization Rt shoulder inf and A/P grades 2-3   Passive ROM Rt shoulder all motions                PT Education - 02/21/17 1104    Education provided Yes   Education Details Reviewed HEP, Reviewed importance of frequency for HEP   Person(s) Educated Patient   Methods Explanation   Comprehension Verbalized understanding             PT Long Term Goals - 02/09/17 8676  PT LONG TERM GOAL #1   Title independent with HEP    Time 6   Period Weeks   Status New   Target Date 03/23/17     PT LONG TERM GOAL #2   Title improve Rt shoulder functional internal rotation to at least T9 for improved function    Time 6   Period Weeks   Status New   Target Date 03/23/17     PT LONG TERM GOAL #3   Title improve Rt shoulder flexion and abduction AROM to at least 110 for improved mobility   Time 6   Period Weeks   Status New   Target Date 03/23/17     PT LONG TERM GOAL #4   Title verbalize understanding of posture/body mechanics to decrease risk of reinjury   Time 6   Period Weeks   Status New   Target Date 03/23/17               Plan - 02/21/17 1341    Clinical Impression Statement Pt with stiffness in R shoulder, addressed with manual therapy and the ex today. Pt to benefit from continued care. Pt with questions about returning to MD  for injection, will discuss with primary PT at next visit. Pt is showing improvements in ROM measurements taken today.    PT Treatment/Interventions ADLs/Self Care Home Management;Cryotherapy;Electrical Stimulation;Moist Heat;Ultrasound;Therapeutic exercise;Therapeutic activities;Functional mobility training;Patient/family education;Manual techniques;Vasopneumatic Device;Taping;Dry needling;Passive range of motion   PT Next Visit Plan AAROM exercises, manual for ROM, postural exercises   Consulted and Agree with Plan of Care Patient      Patient will benefit from skilled therapeutic intervention in order to improve the following deficits and impairments:  Pain, Impaired UE functional use, Postural dysfunction, Decreased range of motion, Decreased strength  Visit Diagnosis: Right shoulder pain, unspecified chronicity  Stiffness of right shoulder, not elsewhere classified     Problem List Patient Active Problem List   Diagnosis Date Noted  . Seborrheic keratoses 08/26/2016  . Follow-up -------- PCP NOTES 03/18/2015  . Hematospermia 12/13/2014  . Annual physical exam 09/14/2013  . LOW BACK PAIN SYNDROME 01/19/2010  . Hyperlipidemia 04/15/2009  . COLONIC POLYPS, HX OF 04/15/2009  . ARTHRALGIA 11/04/2008  . Essential hypertension 04/04/2008  . DIVERTICULOSIS, COLON 04/04/2008  . Prediabetes 04/04/2008  . DIVERTICULITIS, HX OF 04/04/2008  . CALCANEAL SPUR 06/12/2007   Lyndee Hensen, PT, DPT 1:53 PM  02/21/17   [place], [place], [address] Phone: [phone]   Fax:  [phone]  Name: Nicholas Burgess MRN: [id number] Date of Birth: 02-27-1941

## 2017-02-24 ENCOUNTER — Ambulatory Visit (INDEPENDENT_AMBULATORY_CARE_PROVIDER_SITE_OTHER): Payer: PPO | Admitting: Physical Therapy

## 2017-02-24 DIAGNOSIS — M25511 Pain in right shoulder: Secondary | ICD-10-CM | POA: Diagnosis not present

## 2017-02-24 DIAGNOSIS — M25611 Stiffness of right shoulder, not elsewhere classified: Secondary | ICD-10-CM | POA: Diagnosis not present

## 2017-02-24 NOTE — Therapy (Addendum)
[facility] [address] [place], [place], [address] Phone: [phone]   Fax:  [phone]  Physical Therapy Treatment/Discharge  Patient Details  Name: Nicholas Burgess MRN: [id number] Date of Birth: 03-12-1941 Referring Provider: Dr. Kathlene November  Encounter Date: 02/24/2017      PT End of Session - 02/24/17 0845    Visit Number 5   Number of Visits 12   Date for PT Re-Evaluation 03/23/17   PT Start Time 0802   PT Stop Time 0841   PT Time Calculation (min) 39 min   Activity Tolerance Patient tolerated treatment well   Behavior During Therapy [facility] for tasks assessed/performed      Past Medical History:  Diagnosis Date  . Diverticulosis of colon   . Erectile dysfunction due to arterial insufficiency   . Hematospermia   . Hx of colonic polyps 2007  . Hyperlipidemia   . Hypertension   . Syncope 2002    Past Surgical History:  Procedure Laterality Date  . APPENDECTOMY  1992  . COLONOSCOPY W/ POLYPECTOMY  2007 & 2012   Dr.Perry ; hyperplastic  . INGUINAL HERNIA REPAIR  2009   bilateral  . MANDIBLE FRACTURE SURGERY  1964    There were no vitals filed for this visit.      Subjective Assessment - 02/24/17 0810    Subjective Pt states that he has decreased pain, but still has diffiuclty reaching arm up past chest height. He has questions about injection in the shoulder (as previously discussed with MD).                          [facility] Adult PT Treatment/Exercise - 02/24/17 0804      Shoulder Exercises: Supine   External Rotation AAROM;Right;20 reps   External Rotation Limitations cane   Flexion AAROM;Both;20 reps   Flexion Limitations cane and with PBall Rolls on table      Shoulder Exercises: Standing   External Rotation AROM;Right;20 reps   Flexion AAROM;Right;15 reps   Flexion Limitations UE ranger and wall ladder   ABduction AAROM;Right;10 reps   ABduction Limitations scapular plane; UE ranger and  wall ladder   Retraction Both;10 reps;Theraband   Theraband Level (Shoulder Retraction) Level 2 (Red)     Manual Therapy   Manual Therapy Joint mobilization;Passive ROM   Manual therapy comments pt supine   Joint Mobilization Rt shoulder inf and A/P grades 2-3   Passive ROM Rt shoulder all motions                PT Education - 02/24/17 0844    Education provided Yes   Education Details HEP review, review of necessity of PT, and continuing PT for optimal outcome and decreased stiffness.    Person(s) Educated Patient   Methods Explanation   Comprehension Verbalized understanding             PT Long Term Goals - 02/09/17 0947      PT LONG TERM GOAL #1   Title independent with HEP    Time 6   Period Weeks   Status New   Target Date 03/23/17     PT LONG TERM GOAL #2   Title improve Rt shoulder functional internal rotation to at least T9 for improved function    Time 6   Period Weeks   Status New   Target Date 03/23/17     PT LONG TERM GOAL #3   Title  improve Rt shoulder flexion and abduction AROM to at least 110 for improved mobility   Time 6   Period Weeks   Status New   Target Date 03/23/17     PT LONG TERM GOAL #4   Title verbalize understanding of posture/body mechanics to decrease risk of reinjury   Time 6   Period Weeks   Status New   Target Date 03/23/17        Late Entry G-Code:   Functional Assessment Tool Used: clinical judgement Functional Limitation: Self Care Goal Status: CI Discharge Status: [name], PT, DPT 04/21/17 9:54 AM        Plan - 02/24/17 0811    Clinical Impression Statement Pt treated with manual therapy for improving shoulder ROM and function. Stiffness is limiting full ROM. Pt states that he may only be able to come 1x/wk because he is driving 45 min to get to PT. Pt to benefit from continuation of care .   PT Treatment/Interventions ADLs/Self Care Home Management;Cryotherapy;Electrical  Stimulation;Moist Heat;Ultrasound;Therapeutic exercise;Therapeutic activities;Functional mobility training;Patient/family education;Manual techniques;Vasopneumatic Device;Taping;Dry needling;Passive range of motion   PT Next Visit Plan AAROM exercises, manual for ROM, postural exercises   Consulted and Agree with Plan of Care Patient      Patient will benefit from skilled therapeutic intervention in order to improve the following deficits and impairments:  Pain, Impaired UE functional use, Postural dysfunction, Decreased range of motion, Decreased strength  Visit Diagnosis: Right shoulder pain, unspecified chronicity  Stiffness of right shoulder, not elsewhere classified     Problem List Patient Active Problem List   Diagnosis Date Noted  . Seborrheic keratoses 08/26/2016  . Follow-up -------- PCP NOTES 03/18/2015  . Hematospermia 12/13/2014  . Annual physical exam 09/14/2013  . LOW BACK PAIN SYNDROME 01/19/2010  . Hyperlipidemia 04/15/2009  . COLONIC POLYPS, HX OF 04/15/2009  . ARTHRALGIA 11/04/2008  . Essential hypertension 04/04/2008  . DIVERTICULOSIS, COLON 04/04/2008  . Prediabetes 04/04/2008  . DIVERTICULITIS, HX OF 04/04/2008  . CALCANEAL SPUR 06/12/2007    Lyndee Hensen, PT, DPT 8:58 AM  02/24/17     [facility] [place], [place], [address] Phone: [phone]   Fax:  [phone]  Name: Nicholas Burgess MRN: [id number] Date of Birth: Jul 01, 1941       PHYSICAL THERAPY DISCHARGE SUMMARY  Visits from Start of Care: 5  Current functional level related to goals / functional outcomes: See above   Remaining deficits: Unknown; pt did not return   Education / Equipment: HEP  Plan: Patient agrees to discharge.  Patient goals were not met. Patient is being discharged due to not returning since the last visit.  ?????    Laureen Abrahams, PT, DPT 04/21/17 9:51 AM  [place], [place], [address] Phone: [phone]  Fax: [phone]

## 2017-02-25 ENCOUNTER — Telehealth: Payer: Self-pay | Admitting: Internal Medicine

## 2017-02-25 DIAGNOSIS — M7501 Adhesive capsulitis of right shoulder: Secondary | ICD-10-CM

## 2017-02-25 NOTE — Telephone Encounter (Signed)
Please advise 

## 2017-02-25 NOTE — Telephone Encounter (Signed)
Arrange a sports medicine referral please, DX frozen shoulder

## 2017-02-25 NOTE — Telephone Encounter (Signed)
Relation to IR:WERX Call back number:[phone] Pharmacy: [facility], [place] [phone] (Phone) [phone] (Fax)    Reason for call:  Patient states PCP gave him an option between physical therapy or injection for patient shoulder pain, patient states therapy is not working requesting orders for injection, please advise

## 2017-02-25 NOTE — Telephone Encounter (Signed)
Referral placed. Pt informed. 

## 2017-03-01 ENCOUNTER — Ambulatory Visit (INDEPENDENT_AMBULATORY_CARE_PROVIDER_SITE_OTHER): Payer: PPO | Admitting: Family Medicine

## 2017-03-01 ENCOUNTER — Encounter: Payer: Self-pay | Admitting: Family Medicine

## 2017-03-01 DIAGNOSIS — M25511 Pain in right shoulder: Secondary | ICD-10-CM

## 2017-03-01 DIAGNOSIS — G8929 Other chronic pain: Secondary | ICD-10-CM

## 2017-03-01 NOTE — Patient Instructions (Signed)
You have a frozen shoulder (adhesive capsulitis), a buildup of scar tissue that limits motion of the shoulder joint though arthritis is possible (I wouldn't expect this to cause such a quick loss of your motion though). Limit lifting and overhead activities as much as possible. Heat 15 minutes at a time 3-4 times a day may help with movement and stiffness. Tylenol and/or aleve as needed for pain and inflammation. Capsaicin, aspercreme, or biofreeze topically up to four times a day may also help with pain. Some supplements that may help for arthritis: Boswellia extract, curcumin, pycnogenol Steroid injections in a series have been shown to help with pain and motion - if your pain gets bad enough or the lack of motion persists we can start this, just let me know. Codman exercises (pendulum, wall walking or table slides, arm circles) - do 3 sets of 10 once or twice a day. Follow up in 6 weeks or as needed.

## 2017-03-01 NOTE — Progress Notes (Signed)
PCP: Colon Branch, MD  Subjective:   HPI: Patient is a 76 y.o. male here for right shoulder pain.  Patient reports he's had pain and decreased motion in right shoulder for at least 2 months. No acute injury or trauma. Pain is 0/10 at rest but up to 5/10 and sharp at worst. Bothers with reaching out to right side. Not tried any medicines, ice/heat. No prior issues with this shoulder. No night pain. Is right handed. Tried physical therapy for 5-6 visits but did not notice any benefit so stopped.  Past Medical History:  Diagnosis Date  . Diverticulosis of colon   . Erectile dysfunction due to arterial insufficiency   . Hematospermia   . Hx of colonic polyps 2007  . Hyperlipidemia   . Hypertension   . Syncope 2002    Current Outpatient Prescriptions on File Prior to Visit  Medication Sig Dispense Refill  . acetaminophen (TYLENOL) 500 MG tablet Take 500 mg by mouth every 6 (six) hours as needed for pain. For headache pain    . aspirin 81 MG tablet Take 81 mg by mouth daily.      Marland Kitchen atorvastatin (LIPITOR) 20 MG tablet Take 1 tablet (20 mg total) by mouth daily. 90 tablet 0  . hydrochlorothiazide (HYDRODIURIL) 25 MG tablet Take 1 tablet (25 mg total) by mouth daily. 30 tablet 5  . hydrocortisone 2.5 % cream Apply topically 2 (two) times daily. 30 g 0  . losartan (COZAAR) 100 MG tablet Take 1 tablet (100 mg total) by mouth daily. 90 tablet 0  . Multiple Vitamin (MULTIVITAMIN) tablet Take 1 tablet by mouth daily.      . Omega-3 Fatty Acids (FISH OIL) 1000 MG CAPS Take 1,000 mg by mouth daily.       No current facility-administered medications on file prior to visit.     Past Surgical History:  Procedure Laterality Date  . APPENDECTOMY  1992  . COLONOSCOPY W/ POLYPECTOMY  2007 & 2012   Dr.Perry ; hyperplastic  . INGUINAL HERNIA REPAIR  2009   bilateral  . MANDIBLE FRACTURE SURGERY  1964    No Known Allergies  Social History   Social History  . Marital status: Married   Spouse name: N/A  . Number of children: 2  . Years of education: N/A   Occupational History  . retired [employer]   Social History Main Topics  . Smoking status: Former Smoker    Quit date: 07/12/1980  . Smokeless tobacco: Never Used     Comment: smoked 1959-1982, up to 1/2 ppd  . Alcohol use 14.4 oz/week    24 Cans of beer per week     Comment:  socially  . Drug use: No  . Sexual activity: Not on file   Other Topics Concern  . Not on file   Social History Narrative   Household pt and wife   One child in [place], another in [place]    Family History  Problem Relation Age of Onset  . Heart failure Father   . Hypertension Father   . Hypertension Mother   . Alzheimer's disease Mother   . Cancer Mother        GYN  . Diverticulitis Brother   . Diabetes Sister   . Heart attack Brother 44  . Colon cancer Neg Hx   . Stomach cancer Neg Hx   . Rectal cancer Neg Hx   . Stroke Neg Hx   . Prostate cancer  Neg Hx     Ht 6\' 2"  (1.88 m)   Wt 251 lb 3.2 oz (113.9 kg)   BMI 32.25 kg/m   Review of Systems: See HPI above.     Objective:  Physical Exam:  Gen: NAD, comfortable in exam room  Right shoulder: No swelling, ecchymoses.  No gross deformity. No TTP. Abduction to 90 degrees, flexion too 110 degrees.  ER 70 degrees with full IR at neutral but only 30 degrees each at 90 degrees abduction passively. Negative Hawkins, Neers. Negative Yergasons. Strength 5/5 with empty can and resisted internal/external rotation.  No pain with these. NV intact distally.  Left shoulder: FROM without pain.   Assessment & Plan:  1. Right shoulder pain - consistent with adhesive capsulitis, less likely arthritis of glenohumeral joint.  Heat to help with motion.  Shown codman exercises to do daily.  Discussed tylenol, aleve, topical medicines, supplements that may help (if arthritis contributory).  Declined intraarticular injection at this time - if pain worsens or  decides to go ahead advised to call us.  F/u in 6 weeks or prn.

## 2017-03-01 NOTE — Assessment & Plan Note (Signed)
consistent with adhesive capsulitis, less likely arthritis of glenohumeral joint.  Heat to help with motion.  Shown codman exercises to do daily.  Discussed tylenol, aleve, topical medicines, supplements that may help (if arthritis contributory).  Declined intraarticular injection at this time - if pain worsens or decides to go ahead advised to call us.  F/u in 6 weeks or prn.

## 2017-03-10 ENCOUNTER — Encounter: Payer: Self-pay | Admitting: Internal Medicine

## 2017-03-13 DIAGNOSIS — H6122 Impacted cerumen, left ear: Secondary | ICD-10-CM | POA: Diagnosis not present

## 2017-03-15 ENCOUNTER — Encounter: Payer: Self-pay | Admitting: Family

## 2017-03-15 ENCOUNTER — Ambulatory Visit (INDEPENDENT_AMBULATORY_CARE_PROVIDER_SITE_OTHER): Payer: PPO | Admitting: Family

## 2017-03-15 VITALS — BP 160/92 | HR 86 | Temp 97.7°F | Resp 18 | Ht 74.0 in | Wt 248.8 lb

## 2017-03-15 DIAGNOSIS — H6123 Impacted cerumen, bilateral: Secondary | ICD-10-CM | POA: Diagnosis not present

## 2017-03-15 DIAGNOSIS — I10 Essential (primary) hypertension: Secondary | ICD-10-CM | POA: Diagnosis not present

## 2017-03-15 DIAGNOSIS — R6884 Jaw pain: Secondary | ICD-10-CM | POA: Diagnosis not present

## 2017-03-15 NOTE — Progress Notes (Signed)
Subjective:    Patient ID: Nicholas Burgess, male    DOB: [birth year], 76 y.o.   MRN: [id number]  HPI  Nicholas Burgess is a 76 yr old male who presents today with cerumen impaction.  Reports that he has been unable to hear out of the left hear.  Using otc ear wax with little relief.   Reports some mild left jaw pain.   Review of Systems See HPI  Past Medical History:  Diagnosis Date  . Diverticulosis of colon   . Erectile dysfunction due to arterial insufficiency   . Hematospermia   . Hx of colonic polyps 2007  . Hyperlipidemia   . Hypertension   . Syncope 2002     Social History   Social History  . Marital status: Married    Spouse name: N/A  . Number of children: 2  . Years of education: N/A   Occupational History  . retired [employer]   Social History Main Topics  . Smoking status: Former Smoker    Quit date: 07/12/1980  . Smokeless tobacco: Never Used     Comment: smoked 1959-1982, up to 1/2 ppd  . Alcohol use 14.4 oz/week    24 Cans of beer per week     Comment:  socially  . Drug use: No  . Sexual activity: Not on file   Other Topics Concern  . Not on file   Social History Narrative   Household pt and wife   One child in [place], another in [place]    Past Surgical History:  Procedure Laterality Date  . APPENDECTOMY  1992  . COLONOSCOPY W/ POLYPECTOMY  2007 & 2012   Dr.Perry ; hyperplastic  . INGUINAL HERNIA REPAIR  2009   bilateral  . MANDIBLE FRACTURE SURGERY  1964    Family History  Problem Relation Age of Onset  . Heart failure Father   . Hypertension Father   . Hypertension Mother   . Alzheimer's disease Mother   . Cancer Mother        GYN  . Diverticulitis Brother   . Diabetes Sister   . Heart attack Brother 11  . Colon cancer Neg Hx   . Stomach cancer Neg Hx   . Rectal cancer Neg Hx   . Stroke Neg Hx   . Prostate cancer Neg Hx     No Known Allergies  Current Outpatient Prescriptions on File Prior to Visit    Medication Sig Dispense Refill  . acetaminophen (TYLENOL) 500 MG tablet Take 500 mg by mouth every 6 (six) hours as needed for pain. For headache pain    . aspirin 81 MG tablet Take 81 mg by mouth daily.      Marland Kitchen atorvastatin (LIPITOR) 20 MG tablet Take 1 tablet (20 mg total) by mouth daily. 90 tablet 0  . hydrochlorothiazide (HYDRODIURIL) 25 MG tablet Take 1 tablet (25 mg total) by mouth daily. 30 tablet 5  . hydrocortisone 2.5 % cream Apply topically 2 (two) times daily. 30 g 0  . losartan (COZAAR) 100 MG tablet Take 1 tablet (100 mg total) by mouth daily. 90 tablet 0  . Multiple Vitamin (MULTIVITAMIN) tablet Take 1 tablet by mouth daily.      . Omega-3 Fatty Acids (FISH OIL) 1000 MG CAPS Take 1,000 mg by mouth daily.       No current facility-administered medications on file prior to visit.     BP (!) 160/92 (BP Location: Right Arm, Cuff  Size: Normal)   Pulse 86   Temp 97.7 F (36.5 C) (Oral)   Resp 18   Ht 6\' 2"  (1.88 m)   Wt 248 lb 12.8 oz (112.9 kg)   SpO2 96%   BMI 31.94 kg/m       Objective:   Physical Exam  Constitutional: He is oriented to person, place, and time. He appears well-nourished. No distress.  HENT:  Bilateral cerumen impaction.   Musculoskeletal:  No jaw tenderness or click noted with opening/closing of jaw.   Neurological: He is alert and oriented to person, place, and time.  Psychiatric: He has a normal mood and affect. His behavior is normal. Judgment and thought content normal.          Assessment & Plan:  Bilateral cerumen impaction- New. cerumen was removed with irrigation by CMA. After removal of cerumen, normal TM's noted. Instructions for home care to prevent wax buildup are given.  Jaw pain- mild. I advised pt to arrange follow up with his dentist if symptoms worsen or if they fail to improve.   HTN- BP is elevated. Has been much better controlled recently.  He has CPE scheduled on 9/17 with Dr. Larose Kells. Will defer med changes to PCP at that  time if still elevated.   BP Readings from Last 3 Encounters:  03/15/17 (!) 160/92  02/08/17 138/88  08/25/16 132/76

## 2017-03-15 NOTE — Patient Instructions (Signed)
Please contact your dentist if your left sided jaw pain worsens or if it does not improve. To keep your ears clean- you can use ear drops once weekly to both ears at bedtime and flush in AM with warm water using a bulb syringe.

## 2017-03-24 ENCOUNTER — Other Ambulatory Visit: Payer: Self-pay | Admitting: Internal Medicine

## 2017-03-28 ENCOUNTER — Ambulatory Visit (INDEPENDENT_AMBULATORY_CARE_PROVIDER_SITE_OTHER): Payer: PPO | Admitting: Internal Medicine

## 2017-03-28 ENCOUNTER — Encounter: Payer: Self-pay | Admitting: Internal Medicine

## 2017-03-28 VITALS — BP 126/74 | HR 83 | Temp 97.7°F | Resp 14 | Ht 74.0 in | Wt 251.0 lb

## 2017-03-28 DIAGNOSIS — Z0000 Encounter for general adult medical examination without abnormal findings: Secondary | ICD-10-CM

## 2017-03-28 DIAGNOSIS — R739 Hyperglycemia, unspecified: Secondary | ICD-10-CM

## 2017-03-28 LAB — LIPID PANEL
CHOLESTEROL: 163 mg/dL (ref 0–200)
HDL: 44.3 mg/dL (ref >39.00)
NonHDL: 118.53
TRIGLYCERIDES: 222 mg/dL — AB (ref 0.0–149.0)
Total CHOL/HDL Ratio: 4
VLDL: 44.4 mg/dL — AB (ref 0.0–40.0)

## 2017-03-28 LAB — BASIC METABOLIC PANEL
BUN: 11 mg/dL (ref 6–23)
CO2: 30 mEq/L (ref 19–32)
Calcium: 10 mg/dL (ref 8.4–10.5)
Chloride: 102 mEq/L (ref 96–112)
Creatinine, Ser: 0.73 mg/dL (ref 0.40–1.50)
GFR: 111.37 mL/min (ref >60.00)
Glucose, Bld: 102 mg/dL — ABNORMAL HIGH (ref 70–99)
POTASSIUM: 3.7 meq/L (ref 3.5–5.1)
SODIUM: 139 meq/L (ref 135–145)

## 2017-03-28 LAB — PSA: PSA: 2.99 ng/mL (ref 0.10–4.00)

## 2017-03-28 LAB — TSH: TSH: 3.09 u[IU]/mL (ref 0.35–4.50)

## 2017-03-28 LAB — HEMOGLOBIN A1C: Hgb A1c MFr Bld: 6 % (ref 4.6–6.5)

## 2017-03-28 LAB — LDL CHOLESTEROL, DIRECT: LDL DIRECT: 88 mg/dL

## 2017-03-28 NOTE — Progress Notes (Signed)
Pre visit review using our clinic review tool, if applicable. No additional management support is needed unless otherwise documented below in the visit note. 

## 2017-03-28 NOTE — Progress Notes (Signed)
Subjective:    Patient ID: Nicholas Burgess, male    DOB: February 20, 1941, 76 y.o.   MRN: [id number]  DOS:  03/28/2017 Type of visit - description : cpx Interval history: No major concerns, good compliance with medications, ambulatory BPs usually in the 130s.   Review of Systems Had some problem with his ears, had a lavage, now he is asymptomatic  Other than above, a 14 point review of systems is negative     Past Medical History:  Diagnosis Date  . Diverticulosis of colon   . Erectile dysfunction due to arterial insufficiency   . Hematospermia   . Hx of colonic polyps 2007  . Hyperlipidemia   . Hypertension   . Syncope 2002    Past Surgical History:  Procedure Laterality Date  . APPENDECTOMY  1992  . COLONOSCOPY W/ POLYPECTOMY  2007 & 2012   Dr.Perry ; hyperplastic  . INGUINAL HERNIA REPAIR  2009   bilateral  . [place]    Social History   Social History  . Marital status: Married    Spouse name: N/A  . Number of children: 2  . Years of education: N/A   Occupational History  . retired [employer]   Social History Main Topics  . Smoking status: Former Smoker    Quit date: 07/12/1980  . Smokeless tobacco: Never Used     Comment: smoked 1959-1982, up to 1/2 ppd  . Alcohol use 14.4 oz/week    24 Cans of beer per week     Comment:  socially  . Drug use: No  . Sexual activity: Not on file   Other Topics Concern  . Not on file   Social History Narrative   Household pt and wife   One child in [place], another in [place]     Family History  Problem Relation Age of Onset  . Heart failure Father   . Hypertension Father   . Hypertension Mother   . Alzheimer's disease Mother   . Cancer Mother        GYN  . Diverticulitis Brother   . Diabetes Sister   . Heart attack Brother 53  . Colon cancer Neg Hx   . Stomach cancer Neg Hx   . Rectal cancer Neg Hx   . Stroke Neg Hx   . Prostate cancer Neg Hx      Allergies  as of 03/28/2017   No Known Allergies     Medication List       Accurate as of 03/28/17  4:08 PM. Always use your most recent med list.          acetaminophen 500 MG tablet Commonly known as:  TYLENOL Take 500 mg by mouth every 6 (six) hours as needed for pain. For headache pain   aspirin 81 MG tablet Take 81 mg by mouth daily.   atorvastatin 20 MG tablet Commonly known as:  LIPITOR Take 1 tablet (20 mg total) by mouth daily.   Fish Oil 1000 MG Caps Take 1,000 mg by mouth daily.   hydrochlorothiazide 25 MG tablet Commonly known as:  HYDRODIURIL Take 1 tablet (25 mg total) by mouth daily.   losartan 100 MG tablet Commonly known as:  COZAAR Take 1 tablet (100 mg total) by mouth daily.   multivitamin tablet Take 1 tablet by mouth daily.            Discharge Care Instructions  Start     Ordered   03/28/17 1027  LDL cholesterol, direct     03/28/17 1027   03/28/17 5374  Basic metabolic panel     82/70/78 1021   03/28/17 0000  Lipid panel     03/28/17 1021   03/28/17 0000  Hemoglobin A1c     03/28/17 1021   03/28/17 0000  TSH     03/28/17 1021   03/28/17 0000  PSA     03/28/17 1021         Objective:   Physical Exam BP 126/74 (BP Location: Left Arm, Patient Position: Sitting, Cuff Size: Small)   Pulse 83   Temp 97.7 F (36.5 C) (Oral)   Resp 14   Ht 6\' 2"  (1.88 m)   Wt 251 lb (113.9 kg)   SpO2 97%   BMI 32.23 kg/m   General:   Well developed, well nourished . NAD.  Neck: No  thyromegaly  HEENT:  Normocephalic . Face symmetric, atraumatic Lungs:  CTA B Normal respiratory effort, no intercostal retractions, no accessory muscle use. Heart: RRR,  no murmur.  No pretibial edema bilaterally  Abdomen:  Not distended, soft, non-tender. No rebound or rigidity.   Skin: Exposed areas without rash. Not pale. Not jaundice Neurologic:  alert & oriented X3.  Speech normal, gait appropriate for age and unassisted Strength symmetric and  appropriate for age.  Psych: Cognition and judgment appear intact.  Cooperative with normal attention span and concentration.  Behavior appropriate. No anxious or depressed appearing.    Assessment & Plan:   Assessment  Prediabetes HTN dc amlodipine 05-2015 dt edema Hyperlipidemia Hematospermia 1 ( 12-2014) -- saw urology 09-2015, RTC prn Syncope 2002 Skin cancer   PLAN Prediabetes: Check up A1c HTN: Continue losartan, HCTZ. Checking labs High cholesterol: On Lipitor, check labs Shoulder pain: Saw sports  medicine, currently doing self physical therapy, encourage to see them again to possibly have a local injection to prevent ongoing decrease of shoulder ROM. RTC 6 to 8 months, routine checkup

## 2017-03-28 NOTE — Assessment & Plan Note (Addendum)
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 -Labs: BMP, FLP, A1c, TSH, PSA

## 2017-03-28 NOTE — Patient Instructions (Signed)
GO TO THE LAB : Get the blood work     GO TO THE FRONT DESK Schedule your next appointment for a  routine check up in 6-8 months    Check the  blood pressure  monthly  Be sure your blood pressure is between 110/65 and  145/85. If it is consistently higher or lower, let me know  Consider a immunization called [facility]  Consider a Medicare wellness exam with one of our nurses

## 2017-03-28 NOTE — Assessment & Plan Note (Addendum)
Prediabetes: Check up A1c HTN: Continue losartan, HCTZ. Checking labs High cholesterol: On Lipitor, check labs Shoulder pain: Saw sports  medicine, currently doing self physical therapy, encourage to see them again to possibly have a local injection to prevent ongoing decrease of shoulder ROM. RTC 6 to 8 months, routine checkup

## 2017-04-12 ENCOUNTER — Other Ambulatory Visit: Payer: Self-pay | Admitting: Internal Medicine

## 2017-05-09 ENCOUNTER — Other Ambulatory Visit: Payer: Self-pay | Admitting: Internal Medicine

## 2017-05-26 DIAGNOSIS — H524 Presbyopia: Secondary | ICD-10-CM | POA: Diagnosis not present

## 2017-05-26 DIAGNOSIS — H25813 Combined forms of age-related cataract, bilateral: Secondary | ICD-10-CM | POA: Diagnosis not present

## 2017-06-06 ENCOUNTER — Other Ambulatory Visit: Payer: Self-pay | Admitting: Internal Medicine

## 2017-06-06 DIAGNOSIS — H2511 Age-related nuclear cataract, right eye: Secondary | ICD-10-CM | POA: Diagnosis not present

## 2017-06-13 ENCOUNTER — Ambulatory Visit: Payer: Self-pay

## 2017-06-13 DIAGNOSIS — H2511 Age-related nuclear cataract, right eye: Secondary | ICD-10-CM | POA: Diagnosis not present

## 2017-06-13 DIAGNOSIS — H25811 Combined forms of age-related cataract, right eye: Secondary | ICD-10-CM | POA: Diagnosis not present

## 2017-06-13 HISTORY — PX: CATARACT EXTRACTION: SUR2

## 2017-06-13 NOTE — Telephone Encounter (Signed)
Noted  

## 2017-06-13 NOTE — Telephone Encounter (Addendum)
Pt had cataract removed from right eye this am and he reports his BP were "running high" there. Pt states that his BP was as high as 177/111 during the surgery. He stated he didn't take the HCTZ. Asked pt to take pill and will call back in an hour to have pt recheck BP.  Called back  1501 BP:171/94 Per protocol pt needs to be seen in 3 days. Appt schedule 06/16/17 @ 0920 with Dr Larose Kells Reason for Disposition . [8] Systolic BP  >= 182 OR Diastolic >= 993 AND [7] missed most recent dose of blood pressure medication  Answer Assessment - Initial Assessment Questions 1. BLOOD PRESSURE: "What is the blood pressure?" "Did you take at least two measurements 5 minutes apart?"  180/101 and 177/97 2. ONSET: "When did you take your blood pressure?" 1345 and 1350 3. HOW: "How did you obtain the blood pressure?" (e.g., visiting nurse, automatic home BP monitor) Automatic BP monitor 4. HISTORY: "Do you have a history of high blood pressure?" yes 5. MEDICATIONS: "Are you taking any medications for blood pressure?" "Have you missed any doses recently?"     Yes  Has not missed any doses recently 6. OTHER SYMPTOMS: "Do you have any symptoms?" (e.g., headache, chest pain, blurred vision, difficulty breathing, weakness)     no 7. PREGNANCY: "Is there any chance you are pregnant?" "When was your last menstrual period?"     n/a  Protocols used: HIGH BLOOD PRESSURE-A-AH

## 2017-06-13 NOTE — Telephone Encounter (Signed)
  Reason for Disposition . Systolic BP  >= 160 OR Diastolic >= 100  Protocols used: HIGH BLOOD PRESSURE-A-AH  

## 2017-06-13 NOTE — Telephone Encounter (Signed)
FYI

## 2017-06-14 NOTE — Telephone Encounter (Signed)
Called back  1501 BP:171/94 Per protocol pt needs to be seen in 3 days. Appt schedule 06/16/17 @ 0920 with Dr Larose Kells

## 2017-06-16 ENCOUNTER — Encounter: Payer: Self-pay | Admitting: Internal Medicine

## 2017-06-16 ENCOUNTER — Ambulatory Visit: Payer: PPO | Admitting: Internal Medicine

## 2017-06-16 VITALS — BP 148/80 | HR 95 | Temp 97.5°F | Resp 14 | Ht 74.0 in | Wt 255.4 lb

## 2017-06-16 DIAGNOSIS — I10 Essential (primary) hypertension: Secondary | ICD-10-CM | POA: Diagnosis not present

## 2017-06-16 MED ORDER — METOPROLOL SUCCINATE ER 50 MG PO TB24: 50.0000 mg | ORAL_TABLET | Freq: Every day | ORAL | 5 refills | Status: DC

## 2017-06-16 NOTE — Progress Notes (Signed)
Pre visit review using our clinic review tool, if applicable. No additional management support is needed unless otherwise documented below in the visit note. 

## 2017-06-16 NOTE — Patient Instructions (Signed)
In addition to your normal medications, start metoprolol once daily.  If cost is an issue, let me know  Watch your diet, particularly watch your salt intake  Check the  blood pressure 2 or 3 times a  week   Be sure your blood pressure is between 110/65 and  135/85.  if it is consistently higher or lower, let me know

## 2017-06-16 NOTE — Progress Notes (Signed)
Subjective:    Patient ID: Nicholas Burgess, male    DOB: 1940-09-05, 76 y.o.   MRN: [id number]  DOS:  06/16/2017 Type of visit - description : Acute Interval history: Had cataract surgery 4 days ago, BP at the time was 170/110. The patient has been taking his blood pressures at home from time to time, has noted that the SBP is usually in the high 140s to low 150s. Admits that since he retired almost a year ago he has been less active, not eating as healthy, has gained weight.  BP Readings from Last 3 Encounters:  06/16/17 (!) 148/80  03/28/17 126/74  03/15/17 (!) 160/92   Wt Readings from Last 3 Encounters:  06/16/17 255 lb 6 oz (115.8 kg)  03/28/17 251 lb (113.9 kg)  03/15/17 248 lb 12.8 oz (112.9 kg)     Review of Systems Denies chest pain, difficulty breathing or lower extremity edema Not taking NSAIDs regularly. Taking care of his wife with mild dementia, some stress.  Past Medical History:  Diagnosis Date  . Diverticulosis of colon   . Erectile dysfunction due to arterial insufficiency   . Hematospermia   . Hx of colonic polyps 2007  . Hyperlipidemia   . Hypertension   . Syncope 2002    Past Surgical History:  Procedure Laterality Date  . APPENDECTOMY  1992  . CATARACT EXTRACTION Right 06/13/2017  . COLONOSCOPY W/ POLYPECTOMY  2007 & 2012   Dr.Perry ; hyperplastic  . INGUINAL HERNIA REPAIR  2009   bilateral  . [place]    Social History   Socioeconomic History  . Marital status: Married    Spouse name: Not on file  . Number of children: 2  . Years of education: Not on file  . Highest education level: Not on file  Social Needs  . Financial resource strain: Not on file  . Food insecurity - worry: Not on file  . Food insecurity - inability: Not on file  . Transportation needs - medical: Not on file  . Transportation needs - non-medical: Not on file  Occupational History  . Occupation: retired Music therapist:  [employer]  Tobacco Use  . Smoking status: Former Smoker    Last attempt to quit: 07/12/1980    Years since quitting: 36.9  . Smokeless tobacco: Never Used  . Tobacco comment: smoked 1959-1982, up to 1/2 ppd  Substance and Sexual Activity  . Alcohol use: Yes    Alcohol/week: 14.4 oz    Types: 24 Cans of beer per week    Comment:  socially  . Drug use: No  . Sexual activity: Not on file  Other Topics Concern  . Not on file  Social History Narrative   Household pt and wife   One child in [place], another in [place] as of 06/16/2017   No Known Allergies     Medication List        Accurate as of 06/16/17  5:34 PM. Always use your most recent med list.          acetaminophen 500 MG tablet Commonly known as:  TYLENOL Take 500 mg by mouth every 6 (six) hours as needed for pain. For headache pain   aspirin 81 MG tablet Take 81 mg by mouth daily.   atorvastatin 20 MG tablet Commonly known as:  LIPITOR Take 1 tablet (20 mg total) by mouth daily.  Fish Oil 1000 MG Caps Take 1,000 mg by mouth daily.   hydrochlorothiazide 25 MG tablet Commonly known as:  HYDRODIURIL Take 1 tablet (25 mg total) by mouth daily.   losartan 100 MG tablet Commonly known as:  COZAAR Take 1 tablet (100 mg total) by mouth daily.   metoprolol succinate 50 MG 24 hr tablet Commonly known as:  TOPROL-XL Take 1 tablet (50 mg total) by mouth daily. Take with or immediately following a meal.   multivitamin tablet Take 1 tablet by mouth daily.   PROLENSA 0.07 % Soln Generic drug:  Bromfenac Sodium Take as directed          Objective:   Physical Exam BP (!) 148/80 (BP Location: Left Arm, Patient Position: Sitting, Cuff Size: Normal)   Pulse 95   Temp (!) 97.5 F (36.4 C) (Oral)   Resp 14   Ht 6\' 2"  (1.88 m)   Wt 255 lb 6 oz (115.8 kg)   SpO2 95%   BMI 32.79 kg/m   General:   Well developed, well nourished . NAD.  HEENT:  Normocephalic . Face symmetric,  atraumatic Lungs:  CTA B Normal respiratory effort, no intercostal retractions, no accessory muscle use. Heart: RRR,  no murmur.  No pretibial edema bilaterally  Skin: Not pale. Not jaundice Neurologic:  alert & oriented X3.  Speech normal, gait appropriate for age and unassisted Psych--  Cognition and judgment appear intact.  Cooperative with normal attention span and concentration.  Behavior appropriate. No anxious or depressed appearing.      Assessment & Plan:   Assessment  Prediabetes HTN dc amlodipine 05-2015 dt edema Hyperlipidemia Hematospermia 1 ( 12-2014) -- saw urology 09-2015, RTC prn Syncope 2002 Skin cancer   PLAN HTN: Used to be under better control, now SBP in the high 140s, 150s.  Has been less active and gaining weight lately. Plan: cont  losartan, HCTZ.  Add metoprolol XL 50 once daily (if cost is an issue will try plain metoprolol).  In the meantime try to work on diet, exercise, avoid salty foods.  Might not need metoprolol long-term. RTC in few months unless BP continue to be elevated.  Verbalized understanding.

## 2017-06-16 NOTE — Assessment & Plan Note (Signed)
HTN: Used to be under better control, now SBP in the high 140s, 150s.  Has been less active and gaining weight lately. Plan: cont  losartan, HCTZ.  Add metoprolol XL 50 once daily (if cost is an issue will try plain metoprolol).  In the meantime try to work on diet, exercise, avoid salty foods.  Might not need metoprolol long-term. RTC in few months unless BP continue to be elevated.  Verbalized understanding.

## 2017-06-22 DIAGNOSIS — H2512 Age-related nuclear cataract, left eye: Secondary | ICD-10-CM | POA: Diagnosis not present

## 2017-06-27 DIAGNOSIS — H25812 Combined forms of age-related cataract, left eye: Secondary | ICD-10-CM | POA: Diagnosis not present

## 2017-06-27 DIAGNOSIS — H2512 Age-related nuclear cataract, left eye: Secondary | ICD-10-CM | POA: Diagnosis not present

## 2017-08-09 ENCOUNTER — Ambulatory Visit (INDEPENDENT_AMBULATORY_CARE_PROVIDER_SITE_OTHER): Payer: PPO | Admitting: Internal Medicine

## 2017-08-09 ENCOUNTER — Encounter: Payer: Self-pay | Admitting: Internal Medicine

## 2017-08-09 VITALS — BP 136/80 | HR 69 | Temp 97.8°F | Resp 14 | Ht 74.0 in | Wt 259.1 lb

## 2017-08-09 DIAGNOSIS — L989 Disorder of the skin and subcutaneous tissue, unspecified: Secondary | ICD-10-CM

## 2017-08-09 NOTE — Progress Notes (Signed)
Pre visit review using our clinic review tool, if applicable. No additional management support is needed unless otherwise documented below in the visit note. 

## 2017-08-09 NOTE — Progress Notes (Signed)
Subjective:    Patient ID: Nicholas Burgess, male    DOB: Sep 28, 1940, 77 y.o.   MRN: [id number]  DOS:  08/09/2017 Type of visit - description : acute, here w/ wife Interval history:  Has a skin lesion, right neck for a while, wife noted that these size has turned darker. Reports wheezing sound from the chest sometimes, for a few months. Quit tobacco in the 80s. No history of asthma  Wt Readings from Last 3 Encounters:  08/09/17 259 lb 2 oz (117.5 kg)  06/16/17 255 lb 6 oz (115.8 kg)  03/28/17 251 lb (113.9 kg)    Review of Systems Denies cough or DOE.   Mild peri-ankle edema at the end of the day.  Past Medical History:  Diagnosis Date  . Diverticulosis of colon   . Erectile dysfunction due to arterial insufficiency   . Hematospermia   . Hx of colonic polyps 2007  . Hyperlipidemia   . Hypertension   . Syncope 2002    Past Surgical History:  Procedure Laterality Date  . APPENDECTOMY  1992  . CATARACT EXTRACTION Right 06/13/2017  . COLONOSCOPY W/ POLYPECTOMY  2007 & 2012   Dr.Perry ; hyperplastic  . INGUINAL HERNIA REPAIR  2009   bilateral  . [place]    Social History   Socioeconomic History  . Marital status: Married    Spouse name: Not on file  . Number of children: 2  . Years of education: Not on file  . Highest education level: Not on file  Social Needs  . Financial resource strain: Not on file  . Food insecurity - worry: Not on file  . Food insecurity - inability: Not on file  . Transportation needs - medical: Not on file  . Transportation needs - non-medical: Not on file  Occupational History  . Occupation: retired Music therapist: [employer]  Tobacco Use  . Smoking status: Former Smoker    Last attempt to quit: 07/12/1980    Years since quitting: 37.1  . Smokeless tobacco: Never Used  . Tobacco comment: smoked 1959-1982, up to 1/2 ppd  Substance and Sexual Activity  . Alcohol use: Yes    Alcohol/week:  14.4 oz    Types: 24 Cans of beer per week    Comment:  socially  . Drug use: No  . Sexual activity: Not on file  Other Topics Concern  . Not on file  Social History Narrative   Household pt and wife   One child in [place], another in [place] as of 08/09/2017   No Known Allergies     Medication List        Accurate as of 08/09/17 11:59 PM. Always use your most recent med list.          acetaminophen 500 MG tablet Commonly known as:  TYLENOL Take 500 mg by mouth every 6 (six) hours as needed for pain. For headache pain   aspirin 81 MG tablet Take 81 mg by mouth daily.   atorvastatin 20 MG tablet Commonly known as:  LIPITOR Take 1 tablet (20 mg total) by mouth daily.   Fish Oil 1000 MG Caps Take 1,000 mg by mouth daily.   hydrochlorothiazide 25 MG tablet Commonly known as:  HYDRODIURIL Take 1 tablet (25 mg total) by mouth daily.   losartan 100 MG tablet Commonly known as:  COZAAR Take 1 tablet (100 mg total) by  mouth daily.   metoprolol succinate 50 MG 24 hr tablet Commonly known as:  TOPROL-XL Take 1 tablet (50 mg total) by mouth daily. Take with or immediately following a meal.   multivitamin tablet Take 1 tablet by mouth daily.   PROLENSA 0.07 % Soln Generic drug:  Bromfenac Sodium Take as directed          Objective:   Physical Exam  HENT:  Head:     BP 136/80 (BP Location: Left Arm, Patient Position: Sitting, Cuff Size: Normal)   Pulse 69   Temp 97.8 F (36.6 C) (Oral)   Resp 14   Ht 6\' 2"  (1.88 m)   Wt 259 lb 2 oz (117.5 kg)   SpO2 96%   BMI 33.27 kg/m  General:   Well developed, well nourished . NAD.  HEENT:  Normocephalic . Face symmetric, atraumatic Lungs:  CTA B Normal respiratory effort, no intercostal retractions, no accessory muscle use. Heart: RRR,  no murmur.  No pretibial edema bilaterally  Skin: Not pale. Not jaundice Neurologic:  alert & oriented X3.  Speech normal, gait appropriate for age and  unassisted Psych--  Cognition and judgment appear intact.  Cooperative with normal attention span and concentration.  Behavior appropriate. No anxious or depressed appearing.       Assessment & Plan:    Assessment  Prediabetes HTN dc amlodipine 05-2015 dt edema Hyperlipidemia Hematospermia 1 ( 12-2014) -- saw urology 09-2015, RTC prn Syncope 2002 Skin cancer   PLAN Skin lesion: likely a SK but is changing color and increasing in size.  Refer to dermatology.  Patient in agreement Wheezing?  Exam is negative, has gain a small amount of wt gain but doubt cardiac asthma.  No cough.  Recommend observation for now.

## 2017-08-10 NOTE — Assessment & Plan Note (Signed)
Skin lesion: likely a SK but is changing color and increasing in size.  Refer to dermatology.  Patient in agreement Wheezing?  Exam is negative, has gain a small amount of wt gain but doubt cardiac asthma.  No cough.  Recommend observation for now.

## 2017-10-18 ENCOUNTER — Ambulatory Visit: Payer: Self-pay | Admitting: Internal Medicine

## 2017-10-20 DIAGNOSIS — H524 Presbyopia: Secondary | ICD-10-CM | POA: Diagnosis not present

## 2017-10-20 DIAGNOSIS — H26493 Other secondary cataract, bilateral: Secondary | ICD-10-CM | POA: Diagnosis not present

## 2017-10-20 DIAGNOSIS — H40013 Open angle with borderline findings, low risk, bilateral: Secondary | ICD-10-CM | POA: Diagnosis not present

## 2017-10-26 ENCOUNTER — Ambulatory Visit (INDEPENDENT_AMBULATORY_CARE_PROVIDER_SITE_OTHER): Payer: Medicare Other | Admitting: Internal Medicine

## 2017-10-26 ENCOUNTER — Encounter: Payer: Self-pay | Admitting: Internal Medicine

## 2017-10-26 ENCOUNTER — Ambulatory Visit: Payer: Self-pay | Admitting: Internal Medicine

## 2017-10-26 VITALS — BP 136/94 | HR 75 | Temp 97.8°F | Resp 16 | Ht 75.5 in | Wt 257.4 lb

## 2017-10-26 DIAGNOSIS — E7800 Pure hypercholesterolemia, unspecified: Secondary | ICD-10-CM

## 2017-10-26 DIAGNOSIS — L821 Other seborrheic keratosis: Secondary | ICD-10-CM | POA: Diagnosis not present

## 2017-10-26 DIAGNOSIS — D230 Other benign neoplasm of skin of lip: Secondary | ICD-10-CM | POA: Diagnosis not present

## 2017-10-26 DIAGNOSIS — I10 Essential (primary) hypertension: Secondary | ICD-10-CM | POA: Diagnosis not present

## 2017-10-26 DIAGNOSIS — L570 Actinic keratosis: Secondary | ICD-10-CM | POA: Diagnosis not present

## 2017-10-26 LAB — BASIC METABOLIC PANEL
BUN: 11 mg/dL (ref 6–23)
CO2: 31 mEq/L (ref 19–32)
Calcium: 9.7 mg/dL (ref 8.4–10.5)
Chloride: 100 mEq/L (ref 96–112)
Creatinine, Ser: 0.77 mg/dL (ref 0.40–1.50)
GFR: 104.56 mL/min (ref >60.00)
Glucose, Bld: 91 mg/dL (ref 70–99)
POTASSIUM: 4.5 meq/L (ref 3.5–5.1)
SODIUM: 138 meq/L (ref 135–145)

## 2017-10-26 LAB — CBC WITH DIFFERENTIAL/PLATELET
Basophils Absolute: 0 10*3/uL (ref 0.0–0.1)
Basophils Relative: 0.4 % (ref 0.0–3.0)
EOS ABS: 0.1 10*3/uL (ref 0.0–0.7)
Eosinophils Relative: 2 % (ref 0.0–5.0)
HEMATOCRIT: 44.3 % (ref 39.0–52.0)
Hemoglobin: 15.5 g/dL (ref 13.0–17.0)
LYMPHS PCT: 33.6 % (ref 12.0–46.0)
Lymphs Abs: 2.1 10*3/uL (ref 0.7–4.0)
MCHC: 35 g/dL (ref 30.0–36.0)
MCV: 90.4 fl (ref 78.0–100.0)
Monocytes Absolute: 0.6 10*3/uL (ref 0.1–1.0)
Monocytes Relative: 9.2 % (ref 3.0–12.0)
NEUTROS ABS: 3.4 10*3/uL (ref 1.4–7.7)
Neutrophils Relative %: 54.8 % (ref 43.0–77.0)
PLATELETS: 207 10*3/uL (ref 150.0–400.0)
RBC: 4.9 Mil/uL (ref 4.22–5.81)
RDW: 12.9 % (ref 11.5–15.5)
WBC: 6.2 10*3/uL (ref 4.0–10.5)

## 2017-10-26 LAB — AST: AST: 24 U/L (ref 0–37)

## 2017-10-26 LAB — ALT: ALT: 35 U/L (ref 0–53)

## 2017-10-26 NOTE — Progress Notes (Signed)
Subjective:    Patient ID: Nicholas Burgess, male    DOB: Apr 07, 1941, 77 y.o.   MRN: [id number]  DOS:  10/26/2017 Type of visit - description : f/u Interval history: Since the last visit, he went to see the dermatologist actually today, had 2 lesions evaluated.  One was biopsied. Last visit he reported wheezing, today he reports that he indeed hears  a wheezy sound in the chest mostly when she bends to tie his shoelaces.  Otherwise is doing okay. Some stress due to taking care of his wife. Ambulatory BPs usually 130s/90s sometimes.  Concerned about his weight.  Wt Readings from Last 3 Encounters:  10/26/17 257 lb 6.4 oz (116.8 kg)  08/09/17 259 lb 2 oz (117.5 kg)  06/16/17 255 lb 6 oz (115.8 kg)   BP Readings from Last 3 Encounters:  10/26/17 (!) 136/94  08/09/17 136/80  06/16/17 (!) 148/80      Review of Systems   Past Medical History:  Diagnosis Date  . Diverticulosis of colon   . Erectile dysfunction due to arterial insufficiency   . Hematospermia   . Hx of colonic polyps 2007  . Hyperlipidemia   . Hypertension   . Syncope 2002    Past Surgical History:  Procedure Laterality Date  . APPENDECTOMY  1992  . CATARACT EXTRACTION Right 06/13/2017  . COLONOSCOPY W/ POLYPECTOMY  2007 & 2012   Dr.Perry ; hyperplastic  . INGUINAL HERNIA REPAIR  2009   bilateral  . [place]    Social History   Socioeconomic History  . Marital status: Married    Spouse name: Not on file  . Number of children: 2  . Years of education: Not on file  . Highest education level: Not on file  Occupational History  . Occupation: retired Music therapist: [employer]  . Financial resource strain: Not on file  . Food insecurity:    Worry: Not on file    Inability: Not on file  . Transportation needs:    Medical: Not on file    Non-medical: Not on file  Tobacco Use  . Smoking status: Former Smoker    Last attempt to quit:  07/12/1980    Years since quitting: 37.3  . Smokeless tobacco: Never Used  . Tobacco comment: smoked 1959-1982, up to 1/2 ppd  Substance and Sexual Activity  . Alcohol use: Yes    Alcohol/week: 14.4 oz    Types: 24 Cans of beer per week    Comment:  socially  . Drug use: No  . Sexual activity: Not on file  Lifestyle  . Physical activity:    Days per week: Not on file    Minutes per session: Not on file  . Stress: Not on file  Relationships  . Social connections:    Talks on phone: Not on file    Gets together: Not on file    Attends religious service: Not on file    Active member of club or organization: Not on file    Attends meetings of clubs or organizations: Not on file    Relationship status: Not on file  . Intimate partner violence:    Fear of current or ex partner: Not on file    Emotionally abused: Not on file    Physically abused: Not on file    Forced sexual activity: Not on file  Other Topics Concern  . Not on file  Social History Narrative   Household pt and wife   One child in [place], another in [place] as of 10/26/2017   No Known Allergies     Medication List        Accurate as of 10/26/17  1:56 PM. Always use your most recent med list.          acetaminophen 500 MG tablet Commonly known as:  TYLENOL Take 500 mg by mouth every 6 (six) hours as needed for pain. For headache pain   aspirin 81 MG tablet Take 81 mg by mouth daily.   atorvastatin 20 MG tablet Commonly known as:  LIPITOR Take 1 tablet (20 mg total) by mouth daily.   Fish Oil 1000 MG Caps Take 1,000 mg by mouth daily.   hydrochlorothiazide 25 MG tablet Commonly known as:  HYDRODIURIL Take 1 tablet (25 mg total) by mouth daily.   losartan 100 MG tablet Commonly known as:  COZAAR Take 1 tablet (100 mg total) by mouth daily.   metoprolol succinate 50 MG 24 hr tablet Commonly known as:  TOPROL-XL Take 1 tablet (50 mg total) by mouth daily. Take with or immediately  following a meal.   multivitamin tablet Take 1 tablet by mouth daily.   PROLENSA 0.07 % Soln Generic drug:  Bromfenac Sodium Take as directed          Objective:   Physical Exam BP (!) 136/94 (BP Location: Left Arm, Patient Position: Sitting, Cuff Size: Large)   Pulse 75   Temp 97.8 F (36.6 C) (Oral)   Resp 16   Ht 6' 3.5" (1.918 m)   Wt 257 lb 6.4 oz (116.8 kg)   SpO2 98%   BMI 31.75 kg/m  General:   Well developed, well nourished . NAD.  HEENT:  Normocephalic . Face symmetric, atraumatic Lungs:  CTA B Normal respiratory effort, no intercostal retractions, no accessory muscle use. Heart: RRR,  no murmur.  + Pretibial edema L>R.  Calves not tender Skin: Not pale. Not jaundice Neurologic:  alert & oriented X3.  Speech normal, gait appropriate for age and unassisted Psych--  Cognition and judgment appear intact.  Cooperative with normal attention span and concentration.  Behavior appropriate. No anxious or depressed appearing.      Assessment & Plan:    Assessment  Prediabetes HTN dc amlodipine 05-2015 dt edema Hyperlipidemia Hematospermia 1 ( 12-2014) -- saw urology 09-2015, RTC prn Syncope 2002 Skin cancer   PLAN HTN: Currently on losartan, HCTZ, metoprolol.  Diastolic BP in the 41L today.  Check a BMP, CBC.  Recommend to improve diet, continue same medication, monitor BPs and let me know if not at goal. Hyperlipidemia: On Lipitor, last FLP satisfactory, check LFTs. Wheezing?  He did get a slightly short of breath when he bent to the tight his shoelaces, otherwise examination is normal, I do not think he has any major problem, weight loss most likely will help. Mild obesity: Extensive discussion about diet, recommend to exercise 3 hours weekly. RTC physical exam 03-2018

## 2017-10-26 NOTE — Patient Instructions (Addendum)
GO TO THE LAB : Get the blood work     GO TO THE FRONT DESK Schedule your next appointment for a physical exam September 2019    Check the  blood pressure  weekly  Be sure your blood pressure is between 110/65 and  135/85. If it is consistently higher or lower, let me know

## 2017-10-28 NOTE — Assessment & Plan Note (Signed)
HTN: Currently on losartan, HCTZ, metoprolol.  Diastolic BP in the 27O today.  Check a BMP, CBC.  Recommend to improve diet, continue same medication, monitor BPs and let me know if not at goal. Hyperlipidemia: On Lipitor, last FLP satisfactory, check LFTs. Wheezing?  He did get a slightly short of breath when he bent to the tight his shoelaces, otherwise examination is normal, I do not think he has any major problem, weight loss most likely will help. Mild obesity: Extensive discussion about diet, recommend to exercise 3 hours weekly. RTC physical exam 03-2018

## 2017-11-01 ENCOUNTER — Telehealth: Payer: Self-pay | Admitting: *Deleted

## 2017-11-01 ENCOUNTER — Other Ambulatory Visit: Payer: Self-pay | Admitting: Internal Medicine

## 2017-11-01 NOTE — Telephone Encounter (Signed)
Received Dermatopathology Report results from [facility]; forwarded to provider/SLS 04/23

## 2017-12-08 ENCOUNTER — Other Ambulatory Visit: Payer: Self-pay | Admitting: Internal Medicine

## 2017-12-22 ENCOUNTER — Other Ambulatory Visit: Payer: Self-pay | Admitting: Internal Medicine

## 2017-12-27 ENCOUNTER — Ambulatory Visit: Payer: Self-pay | Admitting: Internal Medicine

## 2017-12-27 NOTE — Telephone Encounter (Signed)
Pt reports his BP has been running higher over the last month systolic has  consistently in the 140 - 150 range   And the diastolic has been in the mid 80 range. The patient admits to writing the values down but not the dates Pt denies any symptoms Appt made for tomorrow with Dr Larose Kells   Reason for Disposition . [9] Systolic BP  >= 470 OR Diastolic >= 90 AND [7] taking BP medications  Answer Assessment - Initial Assessment Questions 1. BLOOD PRESSURE: "What is the blood pressure?" "Did you take at least two measurements 5 minutes apart?"     140/83  -  142/80   2. ONSET: "When did you take your blood pressure?"      Today at 1535   3. HOW: "How did you obtain the blood pressure?" (e.g., visiting nurse, automatic home BP monitor)      Automatic home  Machine   4. HISTORY: "Do you have a history of high blood pressure?"      Yes  5. MEDICATIONS: "Are you taking any medications for blood pressure?" "Have you missed any doses recently?"      Yes    Patient reports has not missed any doses   6. OTHER SYMPTOMS: "Do you have any symptoms?" (e.g., headache, chest pain, blurred vision, difficulty breathing, weakness)        No   7. PREGNANCY: "Is there any chance you are pregnant?" "When was your last menstrual period?"     N/a  Protocols used: HIGH BLOOD PRESSURE-A-AH

## 2017-12-28 ENCOUNTER — Encounter: Payer: Self-pay | Admitting: Internal Medicine

## 2017-12-28 ENCOUNTER — Ambulatory Visit (INDEPENDENT_AMBULATORY_CARE_PROVIDER_SITE_OTHER): Payer: Medicare Other | Admitting: Internal Medicine

## 2017-12-28 VITALS — BP 142/90 | HR 79 | Temp 97.9°F | Resp 16 | Ht 75.5 in | Wt 259.1 lb

## 2017-12-28 DIAGNOSIS — I10 Essential (primary) hypertension: Secondary | ICD-10-CM

## 2017-12-28 MED ORDER — METOPROLOL SUCCINATE ER 100 MG PO TB24: 100.0000 mg | ORAL_TABLET | Freq: Every day | ORAL | 1 refills | Status: DC

## 2017-12-28 NOTE — Progress Notes (Signed)
Pre visit review using our clinic review tool, if applicable. No additional management support is needed unless otherwise documented below in the visit note. 

## 2017-12-28 NOTE — Patient Instructions (Signed)
GO TO THE FRONT DESK Schedule your next appointment for a checkup in 3 months  Increase metoprolol XL 100 mg: 1 tablet daily Okay to take the 50 mg tablet two together until you run out   Check the  blood pressure 2 or 3 times a  week   Be sure your blood pressure is between 110/65 and  135/85. If it is consistently higher or lower, let me know  Also, if your monitor check your pulse, it should be no less than 50.  Call with blood pressure readings in 2 weeks  Call if any problems or side effects

## 2017-12-28 NOTE — Progress Notes (Signed)
Subjective:    Patient ID: Nicholas Burgess, male    DOB: 05-22-1941, 77 y.o.   MRN: [id number]  DOS:  12/28/2017 Type of visit - description : Acute visit Interval history: Patient is concerned about his blood pressure, in the last 4 to 6 weeks he has been elevated consistently in the 150s/80- 96. Good med compliance. BP Readings from Last 3 Encounters:  12/28/17 (!) 142/90  10/26/17 (!) 136/94  08/09/17 136/80    Review of Systems Admits that since he retired, he is less active.  Some stress at home, taking care of his wife with dementia.  Denies taking NSAIDs frequently.  No increasing sodium intake. No chest pain, difficulty breathing or lower extremity edema. Sleeps 6 to 8 hours, have never been told he is a loud snorer.  Past Medical History:  Diagnosis Date  . Diverticulosis of colon   . Erectile dysfunction due to arterial insufficiency   . Hematospermia   . Hx of colonic polyps 2007  . Hyperlipidemia   . Hypertension   . Syncope 2002    Past Surgical History:  Procedure Laterality Date  . APPENDECTOMY  1992  . CATARACT EXTRACTION Right 06/13/2017  . COLONOSCOPY W/ POLYPECTOMY  2007 & 2012   Dr.Perry ; hyperplastic  . INGUINAL HERNIA REPAIR  2009   bilateral  . [place]    Social History   Socioeconomic History  . Marital status: Married    Spouse name: Not on file  . Number of children: 2  . Years of education: Not on file  . Highest education level: Not on file  Occupational History  . Occupation: retired Music therapist: [employer]  . Financial resource strain: Not on file  . Food insecurity:    Worry: Not on file    Inability: Not on file  . Transportation needs:    Medical: Not on file    Non-medical: Not on file  Tobacco Use  . Smoking status: Former Smoker    Last attempt to quit: 07/12/1980    Years since quitting: 37.4  . Smokeless tobacco: Never Used  . Tobacco comment: smoked  1959-1982, up to 1/2 ppd  Substance and Sexual Activity  . Alcohol use: Yes    Alcohol/week: 14.4 oz    Types: 24 Cans of beer per week    Comment:  socially  . Drug use: No  . Sexual activity: Not on file  Lifestyle  . Physical activity:    Days per week: Not on file    Minutes per session: Not on file  . Stress: Not on file  Relationships  . Social connections:    Talks on phone: Not on file    Gets together: Not on file    Attends religious service: Not on file    Active member of club or organization: Not on file    Attends meetings of clubs or organizations: Not on file    Relationship status: Not on file  . Intimate partner violence:    Fear of current or ex partner: Not on file    Emotionally abused: Not on file    Physically abused: Not on file    Forced sexual activity: Not on file  Other Topics Concern  . Not on file  Social History Narrative   Household pt and wife   One child in [place], another in [place] as of  12/28/2017   No Known Allergies     Medication List        Accurate as of 12/28/17  9:29 AM. Always use your most recent med list.          acetaminophen 500 MG tablet Commonly known as:  TYLENOL Take 500 mg by mouth every 6 (six) hours as needed for pain. For headache pain   aspirin 81 MG tablet Take 81 mg by mouth daily.   atorvastatin 20 MG tablet Commonly known as:  LIPITOR Take 1 tablet (20 mg total) by mouth daily.   Fish Oil 1000 MG Caps Take 1,000 mg by mouth daily.   hydrochlorothiazide 25 MG tablet Commonly known as:  HYDRODIURIL Take 1 tablet (25 mg total) by mouth daily.   losartan 100 MG tablet Commonly known as:  COZAAR Take 1 tablet (100 mg total) by mouth daily.   metoprolol succinate 50 MG 24 hr tablet Commonly known as:  TOPROL-XL Take 1 tablet (50 mg total) by mouth daily. Take with or immediately following a meal.   multivitamin tablet Take 1 tablet by mouth daily.   PROLENSA 0.07 %  Soln Generic drug:  Bromfenac Sodium Take as directed          Objective:   Physical Exam BP (!) 142/90 (BP Location: Left Arm, Patient Position: Sitting, Cuff Size: Normal)   Pulse 79   Temp 97.9 F (36.6 C) (Oral)   Resp 16   Ht 6' 3.5" (1.918 m)   Wt 259 lb 2 oz (117.5 kg)   SpO2 98%   BMI 31.96 kg/m  General:   Well developed, NAD, see BMI.  HEENT:  Normocephalic . Face symmetric, atraumatic Lungs:  CTA B Normal respiratory effort, no intercostal retractions, no accessory muscle use. Heart: RRR,  no murmur.  No pretibial edema bilaterally  Skin: Not pale. Not jaundice Neurologic:  alert & oriented X3.  Speech normal, gait appropriate for age and unassisted Psych--  Cognition and judgment appear intact.  Cooperative with normal attention span and concentration.  Behavior appropriate. No anxious or depressed appearing.      Assessment & Plan:   Assessment  Prediabetes HTN dc amlodipine 05-2015 dt edema Hyperlipidemia Hematospermia 1 ( 12-2014) -- saw urology 09-2015, RTC prn Syncope 2002 Skin cancer   PLAN HTN: Good compliance with losartan, HCTZ and metoprolol XL 50.  BP is a slightly elevated, I wonder if related to stress related to taking care of his wife and decreased physical activity since retirement. Plan: Increase physical activity, continue watching salt intake, increase Toprol-XL to 100 mg daily.  Watch for side effects, monitor BPs and pulse, call in 2 weeks with readings.  See AVS. RTC 3 months

## 2017-12-29 NOTE — Assessment & Plan Note (Signed)
HTN: Good compliance with losartan, HCTZ and metoprolol XL 50.  BP is a slightly elevated, I wonder if related to stress related to taking care of his wife and decreased physical activity since retirement. Plan: Increase physical activity, continue watching salt intake, increase Toprol-XL to 100 mg daily.  Watch for side effects, monitor BPs and pulse, call in 2 weeks with readings.  See AVS. RTC 3 months

## 2018-01-17 ENCOUNTER — Other Ambulatory Visit: Payer: Self-pay | Admitting: Internal Medicine

## 2018-01-23 ENCOUNTER — Ambulatory Visit (INDEPENDENT_AMBULATORY_CARE_PROVIDER_SITE_OTHER): Payer: Medicare Other | Admitting: Internal Medicine

## 2018-01-23 ENCOUNTER — Ambulatory Visit (HOSPITAL_BASED_OUTPATIENT_CLINIC_OR_DEPARTMENT_OTHER)
Admission: RE | Admit: 2018-01-23 | Discharge: 2018-01-23 | Disposition: A | Discharge status: Home / Self Care | Payer: Medicare Other | Source: Ambulatory Visit | Attending: Internal Medicine | Admitting: Internal Medicine

## 2018-01-23 ENCOUNTER — Encounter: Payer: Self-pay | Admitting: Internal Medicine

## 2018-01-23 VITALS — BP 142/80 | HR 67 | Temp 97.5°F | Resp 16 | Ht 75.5 in | Wt 260.4 lb

## 2018-01-23 DIAGNOSIS — M7989 Other specified soft tissue disorders: Secondary | ICD-10-CM

## 2018-01-23 DIAGNOSIS — S9032XA Contusion of left foot, initial encounter: Secondary | ICD-10-CM | POA: Diagnosis not present

## 2018-01-23 DIAGNOSIS — I10 Essential (primary) hypertension: Secondary | ICD-10-CM | POA: Diagnosis not present

## 2018-01-23 DIAGNOSIS — M25475 Effusion, left foot: Secondary | ICD-10-CM

## 2018-01-23 DIAGNOSIS — R600 Localized edema: Secondary | ICD-10-CM | POA: Diagnosis not present

## 2018-01-23 NOTE — Progress Notes (Signed)
Subjective:    Patient ID: Nicholas Burgess, male    DOB: 09-13-1940, 77 y.o.   MRN: [id number]  DOS:  01/23/2018 Type of visit - description : Acute visit Interval history: Symptoms of started~  5 days ago with some discoloration and swelling at the left toes. On exam, the swelling goes up to meet pretibial area, he was not really aware of that. HTN: Good med compliance, ambulatory BPs range from 130 to 300P with a diastolic in the 23R or 00T.  BP Readings from Last 3 Encounters:  01/23/18 (!) 142/80  12/28/17 (!) 142/90  10/26/17 (!) 136/94    Review of Systems Denies any fever chills. No injury or fall No knee pain Denies chest pain, difficulty breathing or palpitations. No recent prolonged car trip.   Past Medical History:  Diagnosis Date  . Diverticulosis of colon   . Erectile dysfunction due to arterial insufficiency   . Hematospermia   . Hx of colonic polyps 2007  . Hyperlipidemia   . Hypertension   . Syncope 2002    Past Surgical History:  Procedure Laterality Date  . APPENDECTOMY  1992  . CATARACT EXTRACTION Right 06/13/2017  . COLONOSCOPY W/ POLYPECTOMY  2007 & 2012   Dr.Perry ; hyperplastic  . INGUINAL HERNIA REPAIR  2009   bilateral  . [place]    Social History   Socioeconomic History  . Marital status: Married    Spouse name: Not on file  . Number of children: 2  . Years of education: Not on file  . Highest education level: Not on file  Occupational History  . Occupation: retired Music therapist: [employer]  . Financial resource strain: Not on file  . Food insecurity:    Worry: Not on file    Inability: Not on file  . Transportation needs:    Medical: Not on file    Non-medical: Not on file  Tobacco Use  . Smoking status: Former Smoker    Last attempt to quit: 07/12/1980    Years since quitting: 37.5  . Smokeless tobacco: Never Used  . Tobacco comment: smoked 1959-1982, up to  1/2 ppd  Substance and Sexual Activity  . Alcohol use: Yes    Alcohol/week: 14.4 oz    Types: 24 Cans of beer per week    Comment:  socially  . Drug use: No  . Sexual activity: Not on file  Lifestyle  . Physical activity:    Days per week: Not on file    Minutes per session: Not on file  . Stress: Not on file  Relationships  . Social connections:    Talks on phone: Not on file    Gets together: Not on file    Attends religious service: Not on file    Active member of club or organization: Not on file    Attends meetings of clubs or organizations: Not on file    Relationship status: Not on file  . Intimate partner violence:    Fear of current or ex partner: Not on file    Emotionally abused: Not on file    Physically abused: Not on file    Forced sexual activity: Not on file  Other Topics Concern  . Not on file  Social History Narrative   Household pt and wife   One child in [place], another in [place] as of 01/23/2018  No Known Allergies     Medication List        Accurate as of 01/23/18 11:59 PM. Always use your most recent med list.          acetaminophen 500 MG tablet Commonly known as:  TYLENOL Take 500 mg by mouth every 6 (six) hours as needed for pain. For headache pain   aspirin 81 MG tablet Take 81 mg by mouth daily.   atorvastatin 20 MG tablet Commonly known as:  LIPITOR Take 1 tablet (20 mg total) by mouth daily.   Fish Oil 1000 MG Caps Take 1,000 mg by mouth daily.   hydrochlorothiazide 25 MG tablet Commonly known as:  HYDRODIURIL Take 1 tablet (25 mg total) by mouth daily.   losartan 100 MG tablet Commonly known as:  COZAAR Take 1 tablet (100 mg total) by mouth daily.   metoprolol succinate 100 MG 24 hr tablet Commonly known as:  TOPROL-XL Take 1 tablet (100 mg total) by mouth daily. Take with or immediately following a meal.   multivitamin tablet Take 1 tablet by mouth daily.   PROLENSA 0.07 % Soln Generic drug:   Bromfenac Sodium Take as directed          Objective:   Physical Exam BP (!) 142/80 (BP Location: Left Arm, Patient Position: Sitting, Cuff Size: Normal)   Pulse 67   Temp (!) 97.5 F (36.4 C) (Oral)   Resp 16   Ht 6' 3.5" (1.918 m)   Wt 260 lb 6 oz (118.1 kg)   SpO2 98%   BMI 32.12 kg/m  General:   Well developed, NAD, see BMI.  HEENT:  Normocephalic . Face symmetric, atraumatic Lower extremities: There is +/+++ pitting edema distally at the left pretibial area and minimal at the proximal left pretibial area. No edema on the right pretibial area Right foot normal Left foot: Mild discoloration/ecchymoses on toes 2, 3 and 4.  Mild dorsal edema, good pedal pulse. No TTP at the foot anywhere. Ankle is full range of motion with no malleolar tenderness. Knees: Symmetric, no redness, no effusion Calves: Measured, symmetric. Skin: Not pale. Not jaundice Neurologic:  alert & oriented X3.  Speech normal, gait appropriate for age and unassisted Psych--  Cognition and judgment appear intact.  Cooperative with normal attention span and concentration.  Behavior appropriate. No anxious or depressed appearing.      Assessment & Plan:    Assessment  Prediabetes HTN dc amlodipine 05-2015 dt edema Hyperlipidemia Hematospermia 1 ( 12-2014) -- saw urology 09-2015, RTC prn Syncope 2002 Skin cancer   PLAN Left lower extremity swelling: Distal from the mid pretibial down, no history of injury, no TTP. Does home some bruising DDX includes occult fracture, DVT, Baker's cyst rupture versus others. Plan: Ultrasound to rule out DVT, x-ray left food, further advised with results.  Leg elevation. Addendum: results neg, rx leg elevation, call if no better in 2-3 days (?ruptured Backer's cyst) HTN: BPs in the 130s, 140s and occasionally 150.  Diastolic in the 77A and 12I.  No change for now.  Asked patient to bring BP readings in 3 to 4 weeks.

## 2018-01-23 NOTE — Progress Notes (Signed)
Pre visit review using our clinic review tool, if applicable. No additional management support is needed unless otherwise documented below in the visit note. 

## 2018-01-23 NOTE — Patient Instructions (Signed)
  STOP BY THE FIRST FLOOR:    Get the XR  Ask about the ultrasound.  Leg elevation  Call  If not gradually better in the next few days

## 2018-01-24 NOTE — Assessment & Plan Note (Signed)
Left lower extremity swelling: Distal from the mid pretibial down, no history of injury, no TTP. Does home some bruising DDX includes occult fracture, DVT, Baker's cyst rupture versus others. Plan: Ultrasound to rule out DVT, x-ray left food, further advised with results.  Leg elevation. Addendum: results neg, rx leg elevation, call if no better in 2-3 days (?ruptured Backer's cyst) HTN: BPs in the 130s, 140s and occasionally 150.  Diastolic in the 26J and 33L.  No change for now.  Asked patient to bring BP readings in 3 to 4 weeks.

## 2018-03-29 ENCOUNTER — Ambulatory Visit (INDEPENDENT_AMBULATORY_CARE_PROVIDER_SITE_OTHER): Payer: Medicare Other | Admitting: Internal Medicine

## 2018-03-29 ENCOUNTER — Encounter: Payer: Self-pay | Admitting: Internal Medicine

## 2018-03-29 VITALS — BP 126/70 | HR 76 | Temp 97.6°F | Resp 16 | Ht 76.0 in | Wt 261.0 lb

## 2018-03-29 DIAGNOSIS — Z0000 Encounter for general adult medical examination without abnormal findings: Secondary | ICD-10-CM

## 2018-03-29 DIAGNOSIS — R739 Hyperglycemia, unspecified: Secondary | ICD-10-CM | POA: Diagnosis not present

## 2018-03-29 NOTE — Patient Instructions (Signed)
GO TO THE LAB : Get the blood work     GO TO THE FRONT DESK Schedule your next appointment for a physical exam in 1 year  Keep your legs elevated to prevent swelling, if the swelling is worse let me know  Consider a   Medicare wellness with  one of our nurses    Check the  blood pressure 2   times a month   Be sure your blood pressure is between 110/65 and  135/85. If it is consistently higher or lower, let me know

## 2018-03-29 NOTE — Assessment & Plan Note (Signed)
Prediabetes: Diet controlled. HTN: Continue losartan, hydrochlorothiazide.  Checking labs Leg edema: At baseline, see last visit, [place] was negative for DVT Hyperlipidemia: On Lipitor, checking labs. Doing well, RTC 1 year.  Sooner if needed.

## 2018-03-29 NOTE — Progress Notes (Signed)
Pre visit review using our clinic review tool, if applicable. No additional management support is needed unless otherwise documented below in the visit note. 

## 2018-03-29 NOTE — Progress Notes (Signed)
Subjective:    Patient ID: Nicholas Burgess, male    DOB: September 15, 1940, 77 y.o.   MRN: [id number]  DOS:  03/29/2018 Type of visit - description : cpx Interval history: He has no major concerns, good compliance with medication, no recent ambulatory BPs  Review of Systems Continue with on and off lower extremity edema.  Worse on the left.  Other than above, a 14 point review of systems is negative    Past Medical History:  Diagnosis Date  . Diverticulosis of colon   . Erectile dysfunction due to arterial insufficiency   . Hematospermia   . Hx of colonic polyps 2007  . Hyperlipidemia   . Hypertension   . Syncope 2002    Past Surgical History:  Procedure Laterality Date  . APPENDECTOMY  1992  . CATARACT EXTRACTION Bilateral 06/13/2017  . COLONOSCOPY W/ POLYPECTOMY  2007 & 2012   Dr.Perry ; hyperplastic  . INGUINAL HERNIA REPAIR  2009   bilateral  . [facility]    Social History   Socioeconomic History  . Marital status: Married    Spouse name: Not on file  . Number of children: 2  . Years of education: Not on file  . Highest education level: Not on file  Occupational History  . Occupation: retired Music therapist: [employer]  . Financial resource strain: Not on file  . Food insecurity:    Worry: Not on file    Inability: Not on file  . Transportation needs:    Medical: Not on file    Non-medical: Not on file  Tobacco Use  . Smoking status: Former Smoker    Last attempt to quit: 07/12/1980    Years since quitting: 37.7  . Smokeless tobacco: Never Used  . Tobacco comment: smoked 1959-1982, up to 1/2 ppd  Substance and Sexual Activity  . Alcohol use: Yes    Alcohol/week: 24.0 standard drinks    Types: 24 Cans of beer per week    Comment:  socially  . Drug use: No  . Sexual activity: Not on file  Lifestyle  . Physical activity:    Days per week: Not on file    Minutes per session: Not on file  . Stress:  Not on file  Relationships  . Social connections:    Talks on phone: Not on file    Gets together: Not on file    Attends religious service: Not on file    Active member of club or organization: Not on file    Attends meetings of clubs or organizations: Not on file    Relationship status: Not on file  . Intimate partner violence:    Fear of current or ex partner: Not on file    Emotionally abused: Not on file    Physically abused: Not on file    Forced sexual activity: Not on file  Other Topics Concern  . Not on file  Social History Narrative   Household pt and wife; she has dementia, pt takes care of here   One child in [place], another in [place]     Family History  Problem Relation Age of Onset  . Heart failure Father   . Hypertension Father   . Hypertension Mother   . Alzheimer's disease Mother   . Cancer Mother        GYN  . Diverticulitis Brother   . Diabetes Sister   . Heart  attack Brother 51  . Colon cancer Neg Hx   . Stomach cancer Neg Hx   . Rectal cancer Neg Hx   . Stroke Neg Hx   . Prostate cancer Neg Hx      Allergies as of 03/29/2018   No Known Allergies     Medication List        Accurate as of 03/29/18  7:20 PM. Always use your most recent med list.          acetaminophen 500 MG tablet Commonly known as:  TYLENOL Take 500 mg by mouth every 6 (six) hours as needed for pain. For headache pain   aspirin 81 MG tablet Take 81 mg by mouth daily.   atorvastatin 20 MG tablet Commonly known as:  LIPITOR Take 1 tablet (20 mg total) by mouth daily.   Fish Oil 1000 MG Caps Take 1,000 mg by mouth daily.   hydrochlorothiazide 25 MG tablet Commonly known as:  HYDRODIURIL Take 1 tablet (25 mg total) by mouth daily.   losartan 100 MG tablet Commonly known as:  COZAAR Take 1 tablet (100 mg total) by mouth daily.   metoprolol succinate 100 MG 24 hr tablet Commonly known as:  TOPROL-XL Take 1 tablet (100 mg total) by mouth daily. Take with or  immediately following a meal.   multivitamin tablet Take 1 tablet by mouth daily.   PROLENSA 0.07 % Soln Generic drug:  Bromfenac Sodium Take as directed          Objective:   Physical Exam BP 126/70 (BP Location: Left Arm, Patient Position: Sitting, Cuff Size: Normal)   Pulse 76   Temp 97.6 F (36.4 C) (Oral)   Resp 16   Ht 6\' 4"  (1.93 m)   Wt 261 lb (118.4 kg)   SpO2 98%   BMI 31.77 kg/m  General: Well developed, NAD, see BMI.  Neck: No  thyromegaly  HEENT:  Normocephalic . Face symmetric, atraumatic Lungs:  CTA B Normal respiratory effort, no intercostal retractions, no accessory muscle use. Heart: RRR,  no murmur.  + Pitting edema midway to the pretibial area, worse on the left, L calf is half inch larger in circumference. Abdomen:  Not distended, soft, non-tender. No rebound or rigidity.   Skin: Exposed areas without rash. Not pale. Not jaundice Neurologic:  alert & oriented X3.  Speech normal, gait appropriate for age and unassisted Strength symmetric and appropriate for age.  Psych: Cognition and judgment appear intact.  Cooperative with normal attention span and concentration.  Behavior appropriate. No anxious or depressed appearing.     Assessment & Plan:    Assessment  Prediabetes HTN dc amlodipine 05-2015 dt edema Leg edema, worse on the left, neg [place] for  DVT f7/2019 Hyperlipidemia Hematospermia 1 ( 12-2014) -- saw urology 09-2015, RTC prn Syncope 2002 Skin cancer   PLAN Prediabetes: Diet controlled. HTN: Continue losartan, hydrochlorothiazide.  Checking labs Leg edema: At baseline, see last visit, [place] was negative for DVT Hyperlipidemia: On Lipitor, checking labs. Doing well, RTC 1 year.  Sooner if needed.

## 2018-03-29 NOTE — Assessment & Plan Note (Addendum)
-  Td 2014;  PNA 2016; Prevnar 11-2015;  Shingles 06/2010; shingrex & flu shot discussed.  Reluctant to get more immunizations. -[facility]--04/2011--Dr Perry--neg  -Prostate cancer screening discussed, he is 77 y/o.  Previous PSA is normal.  We agreed no further screening. -diet and exercise discussed -Labs: CMP FLP A1C

## 2018-04-04 ENCOUNTER — Other Ambulatory Visit (INDEPENDENT_AMBULATORY_CARE_PROVIDER_SITE_OTHER): Payer: Medicare Other

## 2018-04-04 DIAGNOSIS — R739 Hyperglycemia, unspecified: Secondary | ICD-10-CM

## 2018-04-04 LAB — COMPREHENSIVE METABOLIC PANEL
ALT: 27 U/L (ref 0–53)
AST: 20 U/L (ref 0–37)
Albumin: 4.1 g/dL (ref 3.5–5.2)
Alkaline Phosphatase: 75 U/L (ref 39–117)
BUN: 13 mg/dL (ref 6–23)
CO2: 32 meq/L (ref 19–32)
Calcium: 9.3 mg/dL (ref 8.4–10.5)
Chloride: 101 mEq/L (ref 96–112)
Creatinine, Ser: 0.73 mg/dL (ref 0.40–1.50)
GFR: 111.07 mL/min (ref >60.00)
GLUCOSE: 105 mg/dL — AB (ref 70–99)
POTASSIUM: 3.8 meq/L (ref 3.5–5.1)
Sodium: 140 mEq/L (ref 135–145)
Total Bilirubin: 0.7 mg/dL (ref 0.2–1.2)
Total Protein: 6.8 g/dL (ref 6.0–8.3)

## 2018-04-04 LAB — LIPID PANEL
CHOL/HDL RATIO: 4
Cholesterol: 160 mg/dL (ref 0–200)
HDL: 36.4 mg/dL — AB (ref >39.00)
NONHDL: 123.99
Triglycerides: 204 mg/dL — ABNORMAL HIGH (ref 0.0–149.0)
VLDL: 40.8 mg/dL — AB (ref 0.0–40.0)

## 2018-04-04 LAB — LDL CHOLESTEROL, DIRECT: Direct LDL: 101 mg/dL

## 2018-04-04 LAB — HEMOGLOBIN A1C: Hgb A1c MFr Bld: 6.4 % (ref 4.6–6.5)

## 2018-04-20 DIAGNOSIS — H40013 Open angle with borderline findings, low risk, bilateral: Secondary | ICD-10-CM | POA: Diagnosis not present

## 2018-04-20 DIAGNOSIS — H26491 Other secondary cataract, right eye: Secondary | ICD-10-CM | POA: Diagnosis not present

## 2018-05-04 ENCOUNTER — Other Ambulatory Visit: Payer: Self-pay | Admitting: Internal Medicine

## 2018-05-08 ENCOUNTER — Ambulatory Visit (INDEPENDENT_AMBULATORY_CARE_PROVIDER_SITE_OTHER): Payer: Medicare Other | Admitting: Family Medicine

## 2018-05-08 ENCOUNTER — Encounter: Payer: Self-pay | Admitting: Family Medicine

## 2018-05-08 ENCOUNTER — Ambulatory Visit: Payer: Medicare Other | Admitting: Family Medicine

## 2018-05-08 VITALS — BP 138/86 | HR 68 | Temp 98.0°F | Resp 16 | Ht 74.0 in | Wt 261.0 lb

## 2018-05-08 DIAGNOSIS — H6123 Impacted cerumen, bilateral: Secondary | ICD-10-CM | POA: Diagnosis not present

## 2018-05-08 NOTE — Progress Notes (Signed)
Chief Complaint  Patient presents with  . Ear Fullness    bilateral ear fullness, "more on the left"    Subjective: Patient is a 77 y.o. male here for L ear fullness.  B/l ear fullness. Feels like a wax. Tried Q tips with no avail. No help with peroxide. Hearing is not good right now. No drainage or fevers.   ROS: HEENT: As noted in HPI  Past Medical History:  Diagnosis Date  . Diverticulosis of colon   . Erectile dysfunction due to arterial insufficiency   . Hematospermia   . Hx of colonic polyps 2007  . Hyperlipidemia   . Hypertension   . Syncope 2002    Objective: BP 138/86 (BP Location: Left Arm, Patient Position: Sitting, Cuff Size: Normal)   Pulse 68   Temp 98 F (36.7 C) (Oral)   Resp 16   Ht 6\' 2"  (1.88 m)   Wt 261 lb (118.4 kg)   SpO2 97%   BMI 33.51 kg/m  General: Awake, appears stated age HEENT: No ext ear lesions. Canal 90% obstructed on R, 100% obstructed on L. Patent after procedure. Tm's neg.  Lungs: No accessory muscle use Psych: Age appropriate judgment and insight, normal affect and mood  Procedure note: Cerumen removal Verbal consent obtained Speculum w light source used to remove cerumen b/l Pt tolerated well overall Last remaining wax removed via irrigation on L Pt tolerated this well and reported immediate improvement once canals were paten No immediate complications noted  Assessment and Plan: Bilateral impacted cerumen  Orders as above. AVS wax removal verbalized and written down. F/u prn. The patient voiced understanding and agreement to the plan.  [place], DO 05/08/18  3:21 PM

## 2018-05-08 NOTE — Patient Instructions (Signed)
OK to use Debrox (peroxide) in the ear to loosen up wax. Also recommend using a bulb syringe (for removing boogers from baby's noses) to flush through warm water. Do not use Q-tips as this can impact wax further.  Let us know if you need anything. 

## 2018-05-11 DIAGNOSIS — H26492 Other secondary cataract, left eye: Secondary | ICD-10-CM | POA: Diagnosis not present

## 2018-06-01 ENCOUNTER — Ambulatory Visit: Payer: Self-pay | Admitting: Family Medicine

## 2018-06-20 ENCOUNTER — Other Ambulatory Visit: Payer: Self-pay | Admitting: Internal Medicine

## 2018-06-26 ENCOUNTER — Other Ambulatory Visit: Payer: Self-pay | Admitting: Internal Medicine

## 2018-07-31 ENCOUNTER — Other Ambulatory Visit: Payer: Self-pay | Admitting: Internal Medicine

## 2018-08-15 ENCOUNTER — Ambulatory Visit: Payer: Self-pay | Admitting: *Deleted

## 2018-08-15 NOTE — Telephone Encounter (Signed)
Call patient back to get BP reading- 189/112  Reason for Disposition . Systolic BP  >= 588 OR Diastolic >= 502  Protocols used: HIGH BLOOD PRESSURE-A-AH  Patient denies any other symptoms- headache not present. No other symptoms present. Appointment made for tomorrow am- patient instructed to go to ED if he develops any cardiac or neurologic symptoms before appointment.   Call sent to office for review because BP is still elevated.

## 2018-08-15 NOTE — Telephone Encounter (Signed)
FYI

## 2018-08-15 NOTE — Telephone Encounter (Signed)
Summary: 164/100 BP with headache    Patient called stating that his blood pressure is 164/100 and had a slight headache this morning. Patient states that his headache has improved but would like to schedule an appointment with PCP or speak with nurse to discuss this reading. Please advise.      Call to office- please call patient back in 30 minutes to see what reading is and follow disposition. Patient aware of plan. Reason for Disposition . [7] Systolic BP  >= 619 OR Diastolic >= 509 AND [3] missed most recent dose of blood pressure medication  Answer Assessment - Initial Assessment Questions 1. BLOOD PRESSURE: "What is the blood pressure?" "Did you take at least two measurements 5 minutes apart?"     164/100 before medication, 9:15 182/100 2. ONSET: "When did you take your blood pressure?"     This morning 3. HOW: "How did you obtain the blood pressure?" (e.g., visiting nurse, automatic home BP monitor)     This morning-9:00 4. HISTORY: "Do you have a history of high blood pressure?"     yes 5. MEDICATIONS: "Are you taking any medications for blood pressure?" "Have you missed any doses recently?"     Yes- no change recently 6. OTHER SYMPTOMS: "Do you have any symptoms?" (e.g., headache, chest pain, blurred vision, difficulty breathing, weakness)     Headache for couple days 7. PREGNANCY: "Is there any chance you are pregnant?" "When was your last menstrual period?"     n/a  Protocols used: HIGH BLOOD PRESSURE-A-AH

## 2018-08-16 ENCOUNTER — Ambulatory Visit: Payer: Self-pay | Admitting: Internal Medicine

## 2018-08-16 DIAGNOSIS — Z0289 Encounter for other administrative examinations: Secondary | ICD-10-CM

## 2018-08-17 ENCOUNTER — Encounter: Payer: Self-pay | Admitting: Internal Medicine

## 2018-08-17 ENCOUNTER — Ambulatory Visit (INDEPENDENT_AMBULATORY_CARE_PROVIDER_SITE_OTHER): Payer: Medicare Other | Admitting: Internal Medicine

## 2018-08-17 VITALS — BP 146/92 | HR 81 | Temp 97.9°F | Resp 16 | Ht 74.0 in | Wt 263.0 lb

## 2018-08-17 DIAGNOSIS — I169 Hypertensive crisis, unspecified: Secondary | ICD-10-CM | POA: Diagnosis not present

## 2018-08-17 DIAGNOSIS — I10 Essential (primary) hypertension: Secondary | ICD-10-CM | POA: Diagnosis not present

## 2018-08-17 LAB — BASIC METABOLIC PANEL
BUN: 15 mg/dL (ref 6–23)
CO2: 31 mEq/L (ref 19–32)
Calcium: 9.6 mg/dL (ref 8.4–10.5)
Chloride: 101 mEq/L (ref 96–112)
Creatinine, Ser: 0.73 mg/dL (ref 0.40–1.50)
GFR: 104.4 mL/min (ref >60.00)
Glucose, Bld: 108 mg/dL — ABNORMAL HIGH (ref 70–99)
Potassium: 3.6 mEq/L (ref 3.5–5.1)
Sodium: 139 mEq/L (ref 135–145)

## 2018-08-17 MED ORDER — METOPROLOL SUCCINATE ER 100 MG PO TB24: ORAL_TABLET | ORAL | 3 refills | Status: DC

## 2018-08-17 NOTE — Patient Instructions (Addendum)
GO TO THE LAB : Get the blood work     GO TO THE FRONT DESK Schedule your next appointment   For a check up in 2 months     Check the  blood pressure 2 or 3 times a week  Be sure your blood pressure is between 110/65 and  135/85. If it is consistently higher or lower, let me know   HOW TO TAKE YOUR BLOOD PRESSURE:   Rest 5 minutes before taking your blood pressure.   Don't smoke or drink caffeinated beverages for at least 30 minutes before.   Take your blood pressure before (not after) you eat.   Sit comfortably with your back supported and both feet on the floor (don't cross your legs).   Elevate your arm to heart level on a table or a desk.   Use the proper sized cuff. It should fit smoothly and snugly around your bare upper arm. There should be enough room to slip a fingertip under the cuff. The bottom edge of the cuff should be 1 inch above the crease of the elbow.   Ideally, take 3 measurements at one sitting and record the average.

## 2018-08-17 NOTE — Progress Notes (Signed)
Subjective:    Patient ID: Nicholas Burgess, male    DOB: 12-29-1940, 78 y.o.   MRN: [id number]  DOS:  08/17/2018 Type of visit - description: acute Patient is here because few days ago his BP was elevated,  in the 160s.  Per the telephone note reviewed at some point it was in the 180s. Since then, he has not check his blood pressure again. I ask for BP readings in the last 3 months although he checked from time to time he does not remember the results.  Wt Readings from Last 3 Encounters:  08/17/18 263 lb (119.3 kg)  05/08/18 261 lb (118.4 kg)  03/29/18 261 lb (118.4 kg)     Review of Systems Denies chest pain, difficulty breathing. Occasional lower extremity edema Minimal headache now and then, decreased with Tylenol Denies dizziness, slurred speech.  No motor deficits.  Past Medical History:  Diagnosis Date  . Diverticulosis of colon   . Erectile dysfunction due to arterial insufficiency   . Hematospermia   . Hx of colonic polyps 2007  . Hyperlipidemia   . Hypertension   . Syncope 2002    Past Surgical History:  Procedure Laterality Date  . APPENDECTOMY  1992  . CATARACT EXTRACTION Bilateral 06/13/2017  . COLONOSCOPY W/ POLYPECTOMY  2007 & 2012   Dr.Perry ; hyperplastic  . INGUINAL HERNIA REPAIR  2009   bilateral  . [place]    Social History   Socioeconomic History  . Marital status: Married    Spouse name: Not on file  . Number of children: 2  . Years of education: Not on file  . Highest education level: Not on file  Occupational History  . Occupation: retired Music therapist: [employer]  . Financial resource strain: Not on file  . Food insecurity:    Worry: Not on file    Inability: Not on file  . Transportation needs:    Medical: Not on file    Non-medical: Not on file  Tobacco Use  . Smoking status: Former Smoker    Last attempt to quit: 07/12/1980    Years since quitting: 38.1  .  Smokeless tobacco: Never Used  . Tobacco comment: smoked 1959-1982, up to 1/2 ppd  Substance and Sexual Activity  . Alcohol use: Yes    Alcohol/week: 24.0 standard drinks    Types: 24 Cans of beer per week    Comment:  socially  . Drug use: No  . Sexual activity: Not on file  Lifestyle  . Physical activity:    Days per week: Not on file    Minutes per session: Not on file  . Stress: Not on file  Relationships  . Social connections:    Talks on phone: Not on file    Gets together: Not on file    Attends religious service: Not on file    Active member of club or organization: Not on file    Attends meetings of clubs or organizations: Not on file    Relationship status: Not on file  . Intimate partner violence:    Fear of current or ex partner: Not on file    Emotionally abused: Not on file    Physically abused: Not on file    Forced sexual activity: Not on file  Other Topics Concern  . Not on file  Social History Narrative   Household pt and wife; she has dementia, pt  takes care of here   One child in [place], another in [place] as of 08/17/2018   No Known Allergies     Medication List       Accurate as of August 17, 2018 10:08 AM. Always use your most recent med list.        acetaminophen 500 MG tablet Commonly known as:  TYLENOL Take 500 mg by mouth every 6 (six) hours as needed for pain. For headache pain   aspirin 81 MG tablet Take 81 mg by mouth daily.   atorvastatin 20 MG tablet Commonly known as:  LIPITOR Take 1 tablet (20 mg total) by mouth daily.   Fish Oil 1000 MG Caps Take 1,000 mg by mouth daily.   hydrochlorothiazide 25 MG tablet Commonly known as:  HYDRODIURIL Take 1 tablet (25 mg total) by mouth daily.   losartan 100 MG tablet Commonly known as:  COZAAR Take 1 tablet (100 mg total) by mouth daily.   metoprolol succinate 100 MG 24 hr tablet Commonly known as:  TOPROL-XL Take 1 tablet (100 mg total) by mouth daily. Take with  or immediately following a meal.   multivitamin tablet Take 1 tablet by mouth daily.   PROLENSA 0.07 % Soln Generic drug:  Bromfenac Sodium Take as directed           Objective:   Physical Exam BP (!) 146/92 (BP Location: Left Arm, Patient Position: Sitting, Cuff Size: Normal)   Pulse 81   Temp 97.9 F (36.6 C) (Oral)   Resp 16   Ht 6\' 2"  (1.88 m)   Wt 263 lb (119.3 kg)   SpO2 93%   BMI 33.77 kg/m  General:   Well developed, NAD, BMI noted. HEENT:  Normocephalic . Face symmetric, atraumatic Lungs:  CTA B Normal respiratory effort, no intercostal retractions, no accessory muscle use. Heart: RRR,  no murmur.  Left lower extremity edema at baseline Skin: Not pale. Not jaundice Neurologic:  alert & oriented X3.  Speech normal, gait appropriate for age and unassisted Psych--  Cognition and judgment appear intact.  Cooperative with normal attention span and concentration.  Behavior appropriate. No anxious or depressed appearing.       Assessment      Assessment  Prediabetes HTN dc amlodipine 05-2015 dt edema Leg edema, worse on the left, neg [place] for  DVT 01/2018 Hyperlipidemia Hematospermia 1 ( 12-2014) -- saw urology 09-2015, RTC prn Syncope 2002 Skin cancer   PLAN HTN: Had a couple of high readings in the last few days, today it was elevated when my nurse check, I rechecked it: 130/90.  Patient is asymptomatic. Admits that since he retired he is less active, wife has dementia he takes care of her and they are forced to eat fast food frequently. EKG today NSR  Plan: BMP, increase metoprolol to 150 mg daily, continue HCTZ, losartan.  Monitor BPs closely. Increase physical activity; definitely try to eat low-salt. RTC 2 months

## 2018-08-17 NOTE — Progress Notes (Signed)
Pre visit review using our clinic review tool, if applicable. No additional management support is needed unless otherwise documented below in the visit note. 

## 2018-08-17 NOTE — Assessment & Plan Note (Signed)
HTN: Had a couple of high readings in the last few days, today it was elevated when my nurse check, I rechecked it: 130/90.  Patient is asymptomatic. Admits that since he retired he is less active, wife has dementia he takes care of her and they are forced to eat fast food frequently. EKG today NSR  Plan: BMP, increase metoprolol to 150 mg daily, continue HCTZ, losartan.  Monitor BPs closely. Increase physical activity; definitely try to eat low-salt. RTC 2 months

## 2018-10-17 ENCOUNTER — Ambulatory Visit: Payer: Self-pay | Admitting: *Deleted

## 2018-10-18 ENCOUNTER — Ambulatory Visit: Payer: Self-pay | Admitting: Internal Medicine

## 2018-10-28 ENCOUNTER — Other Ambulatory Visit: Payer: Self-pay | Admitting: Internal Medicine

## 2018-10-30 ENCOUNTER — Other Ambulatory Visit: Payer: Self-pay | Admitting: Internal Medicine

## 2018-11-15 ENCOUNTER — Other Ambulatory Visit: Payer: Self-pay

## 2018-11-15 ENCOUNTER — Ambulatory Visit (INDEPENDENT_AMBULATORY_CARE_PROVIDER_SITE_OTHER): Payer: Medicare Other | Admitting: Internal Medicine

## 2018-11-15 DIAGNOSIS — I10 Essential (primary) hypertension: Secondary | ICD-10-CM | POA: Diagnosis not present

## 2018-11-15 MED ORDER — SPIRONOLACTONE 25 MG PO TABS: 25.0000 mg | ORAL_TABLET | Freq: Every day | ORAL | 0 refills | Status: DC

## 2018-11-15 NOTE — Progress Notes (Signed)
Subjective:    Patient ID: Nicholas Burgess, male    DOB: 05/12/1941, 78 y.o.   MRN: [id number]  DOS:  11/15/2018 Type of visit - description: Attempted  to make this a video visit, due to technical difficulties from the patient side it was not possible  thus we proceeded with a Virtual Visit via Telephone    I connected with@ on 11/17/18 at 11:00 AM EDT by telephone and verified that I am speaking with the correct person using two identifiers.  THIS ENCOUNTER IS A VIRTUAL VISIT DUE TO COVID-19 - PATIENT WAS NOT SEEN IN THE OFFICE. PATIENT HAS CONSENTED TO VIRTUAL VISIT / TELEMEDICINE VISIT   Location of patient: home  Location of provider: office  I discussed the limitations, risks, security and privacy concerns of performing an evaluation and management service by telephone and the availability of in person appointments. I also discussed with the patient that there may be a patient responsible charge related to this service. The patient expressed understanding and agreed to proceed.   History of Present Illness: Follow-up HTN: Reports good compliance with medication. Ambulatory BPs remain moderately elevated. He is not adding any salt to his food. Has not been able to exercise much. COVID-19: He is really staying home taking care of his wife, his family bring him food, etc.   BP Readings from Last 3 Encounters:  08/17/18 (!) 146/92  05/08/18 138/86  03/29/18 126/70    Review of Systems Denies chest pain no difficulty breathing No headache or dizziness  Past Medical History:  Diagnosis Date  . Diverticulosis of colon   . Erectile dysfunction due to arterial insufficiency   . Hematospermia   . Hx of colonic polyps 2007  . Hyperlipidemia   . Hypertension   . Syncope 2002    Past Surgical History:  Procedure Laterality Date  . APPENDECTOMY  1992  . CATARACT EXTRACTION Bilateral 06/13/2017  . COLONOSCOPY W/ POLYPECTOMY  2007 & 2012   Dr.Perry ; hyperplastic  .  INGUINAL HERNIA REPAIR  2009   bilateral  . [place]    Social History   Socioeconomic History  . Marital status: Married    Spouse name: Not on file  . Number of children: 2  . Years of education: Not on file  . Highest education level: Not on file  Occupational History  . Occupation: retired Music therapist: [employer]  . Financial resource strain: Not on file  . Food insecurity:    Worry: Not on file    Inability: Not on file  . Transportation needs:    Medical: Not on file    Non-medical: Not on file  Tobacco Use  . Smoking status: Former Smoker    Last attempt to quit: 07/12/1980    Years since quitting: 38.3  . Smokeless tobacco: Never Used  . Tobacco comment: smoked 1959-1982, up to 1/2 ppd  Substance and Sexual Activity  . Alcohol use: Yes    Alcohol/week: 24.0 standard drinks    Types: 24 Cans of beer per week    Comment:  socially  . Drug use: No  . Sexual activity: Not on file  Lifestyle  . Physical activity:    Days per week: Not on file    Minutes per session: Not on file  . Stress: Not on file  Relationships  . Social connections:    Talks on phone: Not on file  Gets together: Not on file    Attends religious service: Not on file    Active member of club or organization: Not on file    Attends meetings of clubs or organizations: Not on file    Relationship status: Not on file  . Intimate partner violence:    Fear of current or ex partner: Not on file    Emotionally abused: Not on file    Physically abused: Not on file    Forced sexual activity: Not on file  Other Topics Concern  . Not on file  Social History Narrative   Household pt and wife; she has dementia, pt takes care of here   One child in [place], another in [place] as of 11/15/2018   No Known Allergies     Medication List       Accurate as of Nov 15, 2018 11:59 PM. If you have any questions, ask your nurse or  doctor.        acetaminophen 500 MG tablet Commonly known as:  TYLENOL Take 500 mg by mouth every 6 (six) hours as needed for pain. For headache pain   aspirin 81 MG tablet Take 81 mg by mouth daily.   atorvastatin 20 MG tablet Commonly known as:  LIPITOR Take 1 tablet (20 mg total) by mouth daily.   Fish Oil 1000 MG Caps Take 1,000 mg by mouth daily.   hydrochlorothiazide 25 MG tablet Commonly known as:  HYDRODIURIL Take 1 tablet (25 mg total) by mouth daily.   losartan 100 MG tablet Commonly known as:  COZAAR TAKE 1 TABLET BY MOUTH DAILY.   metoprolol succinate 100 MG 24 hr tablet Commonly known as:  TOPROL-XL Take 1.5 tablets a day   multivitamin tablet Take 1 tablet by mouth daily.   Prolensa 0.07 % Soln Generic drug:  Bromfenac Sodium Take as directed   spironolactone 25 MG tablet Commonly known as:  ALDACTONE Take 1 tablet (25 mg total) by mouth daily. Started by:  Kathlene November, MD           Objective:   Physical Exam There were no vitals taken for this visit. This is a virtual phone visit, alert oriented x3, no apparent distress.  BP today 155/88    Assessment    Assessment  Prediabetes HTN dc amlodipine 05-2015 dt edema Leg edema, worse on the left, neg [place] for  DVT 01/2018 Hyperlipidemia Hematospermia 1 ( 12-2014) -- saw urology 09-2015, RTC prn Syncope 2002 Skin cancer   PLAN HTN: Since the last visit, we increase metoprolol to 150 mg daily, he is also on HCTZ and losartan. Ambulatory BPs: 159/98, 157/93, this morning 155/88. Heart rate unknown. Needs a slightly better control, he has a history of edema with amlodipine. Plan: Add spironolactone 25 mg daily (last potassium was 3.6).  Check BPs at home 3-4 times a week, bring the log.  Watch for excessive low blood pressure or side effects, he will call anytime if that happens. He verbalized understanding. RTC face-to-face 2 weeks, will need to check his BP in both arms and do a  BMP.     I  discussed the assessment and treatment plan with the patient. The patient was provided an opportunity to ask questions and all were answered. The patient agreed with the plan and demonstrated an understanding of the instructions.   The patient was advised to call back or seek an in-person evaluation if the symptoms worsen or if  the condition fails to improve as anticipated.  I provided 15 minutes of non-face-to-face time during this encounter.  Kathlene November, MD

## 2018-11-16 ENCOUNTER — Ambulatory Visit: Payer: Self-pay | Admitting: *Deleted

## 2018-11-16 NOTE — Telephone Encounter (Addendum)
Notified Santiago Glad at [facility] about this encounter; she will contact the pt.

## 2018-11-16 NOTE — Telephone Encounter (Addendum)
Pt called stating his BP is 182/85 at 1330 11/16/2018; it was 182/105 at 1335; he says it was 155/88 on 11/15/2018; he says that the readings were taken on his left upper arm with his home cuff; the pt says that he took his spironolactone, losartan, metoprolol and hydrochlorithiazide at 0900; he says that his "head is feeling not right", but he is not having dizziness, numbness, blurred vision, numbness, tingling, chest pain or headache; the pt is not sure of the dates of these readings but his blood pressure has been 127/55, 157/93, and 165/95; he was seen by Dr Larose Kells on 11/15/2018, and spironolactone was prescribedvat that time; recommendations made per nurse triage protocol; the pt can be contacted at [phone] Reason for Disposition . Systolic BP  >= 916 OR Diastolic >= 945  Answer Assessment - Initial Assessment Questions 1. BLOOD PRESSURE: "What is the blood pressure?" "Did you take at least two measurements 5 minutes apart?"     *No Answer* 2. ONSET: "When did you take your blood pressure?"     *No Answer* 3. HOW: "How did you obtain the blood pressure?" (e.g., visiting nurse, automatic home BP monitor)     *No Answer* 4. HISTORY: "Do you have a history of high blood pressure?"     *No Answer* 5. MEDICATIONS: "Are you taking any medications for blood pressure?" "Have you missed any doses recently?"     *No Answer* 6. OTHER SYMPTOMS: "Do you have any symptoms?" (e.g., headache, chest pain, blurred vision, difficulty breathing, weakness)     *No Answer* 7. PREGNANCY: "Is there any chance you are pregnant?" "When was your last menstrual period?"     *No Answer*  Protocols used: HIGH BLOOD PRESSURE-A-AH

## 2018-11-17 ENCOUNTER — Telehealth: Payer: Self-pay

## 2018-11-17 NOTE — Assessment & Plan Note (Signed)
HTN: Since the last visit, we increase metoprolol to 150 mg daily, he is also on HCTZ and losartan. Ambulatory BPs: 159/98, 157/93, this morning 155/88. Heart rate unknown. Needs a slightly better control, he has a history of edema with amlodipine. Plan: Add spironolactone 25 mg daily (last potassium was 3.6).  Check BPs at home 3-4 times a week, bring the log.  Watch for excessive low blood pressure or side effects, he will call anytime if that happens. He verbalized understanding. RTC face-to-face 2 weeks, will need to check his BP in both arms and do a  BMP.

## 2018-11-17 NOTE — Telephone Encounter (Signed)
Agree, he was recommended to be seen face-to-face soon for a follow-up.

## 2018-11-17 NOTE — Telephone Encounter (Addendum)
Follow up call made to patient. States he is feeling better today. BP=143/88. Denies dizziness,SPB,Chest pain. Patient stated he feelsmuch better. Advise to continue taking BPand if it  goes above 140/90 to call office and if he experiences SOB or Chest pain to go to ED. Patient agreed.

## 2018-11-17 NOTE — Telephone Encounter (Signed)
Did you contact Pt?  °

## 2018-11-29 ENCOUNTER — Ambulatory Visit (INDEPENDENT_AMBULATORY_CARE_PROVIDER_SITE_OTHER): Payer: Medicare Other | Admitting: Internal Medicine

## 2018-11-29 ENCOUNTER — Encounter: Payer: Self-pay | Admitting: Internal Medicine

## 2018-11-29 ENCOUNTER — Other Ambulatory Visit: Payer: Self-pay

## 2018-11-29 VITALS — BP 128/86 | HR 76 | Temp 97.6°F | Resp 16 | Ht 74.0 in | Wt 269.0 lb

## 2018-11-29 DIAGNOSIS — R739 Hyperglycemia, unspecified: Secondary | ICD-10-CM | POA: Diagnosis not present

## 2018-11-29 DIAGNOSIS — I10 Essential (primary) hypertension: Secondary | ICD-10-CM | POA: Diagnosis not present

## 2018-11-29 LAB — CBC WITH DIFFERENTIAL/PLATELET
Basophils Absolute: 0.1 10*3/uL (ref 0.0–0.1)
Basophils Relative: 0.7 % (ref 0.0–3.0)
Eosinophils Absolute: 0.1 10*3/uL (ref 0.0–0.7)
Eosinophils Relative: 1.4 % (ref 0.0–5.0)
HCT: 44.7 % (ref 39.0–52.0)
Hemoglobin: 15.7 g/dL (ref 13.0–17.0)
Lymphocytes Relative: 28.9 % (ref 12.0–46.0)
Lymphs Abs: 2 10*3/uL (ref 0.7–4.0)
MCHC: 35 g/dL (ref 30.0–36.0)
MCV: 91.2 fl (ref 78.0–100.0)
Monocytes Absolute: 0.5 10*3/uL (ref 0.1–1.0)
Monocytes Relative: 7 % (ref 3.0–12.0)
Neutro Abs: 4.3 10*3/uL (ref 1.4–7.7)
Neutrophils Relative %: 62 % (ref 43.0–77.0)
Platelets: 197 10*3/uL (ref 150.0–400.0)
RBC: 4.9 Mil/uL (ref 4.22–5.81)
RDW: 12.9 % (ref 11.5–15.5)
WBC: 7 10*3/uL (ref 4.0–10.5)

## 2018-11-29 LAB — BASIC METABOLIC PANEL
BUN: 19 mg/dL (ref 6–23)
CO2: 31 mEq/L (ref 19–32)
Calcium: 9.3 mg/dL (ref 8.4–10.5)
Chloride: 100 mEq/L (ref 96–112)
Creatinine, Ser: 1 mg/dL (ref 0.40–1.50)
GFR: 72.55 mL/min (ref >60.00)
Glucose, Bld: 113 mg/dL — ABNORMAL HIGH (ref 70–99)
Potassium: 4.1 mEq/L (ref 3.5–5.1)
Sodium: 139 mEq/L (ref 135–145)

## 2018-11-29 LAB — HEMOGLOBIN A1C: Hgb A1c MFr Bld: 6.5 % (ref 4.6–6.5)

## 2018-11-29 NOTE — Progress Notes (Signed)
Subjective:    Patient ID: Nicholas Burgess, male    DOB: 1941/04/23, 78 y.o.   MRN: [id number]  DOS:  11/29/2018 Type of visit - description: Follow-up, in person Patient is here for hypertension assessment, recently BP was elevated, was Rx spironolactone. Ambulatory BPs: 159/90, 157/88;  today at home BP improved and it was 137/68.  Review of Systems Denies chest pain no difficulty breathing Trace edema at baseline Denies any muscle cramps  Past Medical History:  Diagnosis Date  . Diverticulosis of colon   . Erectile dysfunction due to arterial insufficiency   . Hematospermia   . Hx of colonic polyps 2007  . Hyperlipidemia   . Hypertension   . Syncope 2002    Past Surgical History:  Procedure Laterality Date  . APPENDECTOMY  1992  . CATARACT EXTRACTION Bilateral 06/13/2017  . COLONOSCOPY W/ POLYPECTOMY  2007 & 2012   Dr.Perry ; hyperplastic  . INGUINAL HERNIA REPAIR  2009   bilateral  . [place]    Social History   Socioeconomic History  . Marital status: Married    Spouse name: Not on file  . Number of children: 2  . Years of education: Not on file  . Highest education level: Not on file  Occupational History  . Occupation: retired Music therapist: [employer]  . Financial resource strain: Not on file  . Food insecurity:    Worry: Not on file    Inability: Not on file  . Transportation needs:    Medical: Not on file    Non-medical: Not on file  Tobacco Use  . Smoking status: Former Smoker    Last attempt to quit: 07/12/1980    Years since quitting: 38.4  . Smokeless tobacco: Never Used  . Tobacco comment: smoked 1959-1982, up to 1/2 ppd  Substance and Sexual Activity  . Alcohol use: Yes    Alcohol/week: 24.0 standard drinks    Types: 24 Cans of beer per week    Comment:  socially  . Drug use: No  . Sexual activity: Not on file  Lifestyle  . Physical activity:    Days per week: Not on file     Minutes per session: Not on file  . Stress: Not on file  Relationships  . Social connections:    Talks on phone: Not on file    Gets together: Not on file    Attends religious service: Not on file    Active member of club or organization: Not on file    Attends meetings of clubs or organizations: Not on file    Relationship status: Not on file  . Intimate partner violence:    Fear of current or ex partner: Not on file    Emotionally abused: Not on file    Physically abused: Not on file    Forced sexual activity: Not on file  Other Topics Concern  . Not on file  Social History Narrative   Household pt and wife; she has dementia, pt takes care of here   One child in [place], another in [place] as of 11/29/2018   No Known Allergies     Medication List       Accurate as of Nov 29, 2018  8:51 AM. If you have any questions, ask your nurse or doctor.        acetaminophen 500 MG tablet Commonly known as:  TYLENOL Take 500 mg by mouth every 6 (six) hours as needed for pain. For headache pain   aspirin 81 MG tablet Take 81 mg by mouth daily.   atorvastatin 20 MG tablet Commonly known as:  LIPITOR Take 1 tablet (20 mg total) by mouth daily.   Fish Oil 1000 MG Caps Take 1,000 mg by mouth daily.   hydrochlorothiazide 25 MG tablet Commonly known as:  HYDRODIURIL Take 1 tablet (25 mg total) by mouth daily.   losartan 100 MG tablet Commonly known as:  COZAAR TAKE 1 TABLET BY MOUTH DAILY.   metoprolol succinate 100 MG 24 hr tablet Commonly known as:  TOPROL-XL Take 1.5 tablets a day   multivitamin tablet Take 1 tablet by mouth daily.   Prolensa 0.07 % Soln Generic drug:  Bromfenac Sodium Take as directed   spironolactone 25 MG tablet Commonly known as:  ALDACTONE Take 1 tablet (25 mg total) by mouth daily.           Objective:   Physical Exam BP 128/86 (BP Location: Left Arm, Patient Position: Sitting, Cuff Size: Normal)   Pulse 76   Temp  97.6 F (36.4 C) (Oral)   Resp 16   Ht 6\' 2"  (1.88 m)   Wt 269 lb (122 kg)   SpO2 96%   BMI 34.54 kg/m  General:   Well developed, NAD, BMI noted.  HEENT:  Normocephalic . Face symmetric, atraumatic Lungs:  CTA B Normal respiratory effort, no intercostal retractions, no accessory muscle use. Heart: RRR,  no murmur.  Trace pretibial edema bilaterally  Abdomen:  Not distended, soft, non-tender. No rebound or rigidity.   Skin: Not pale. Not jaundice Neurologic:  alert & oriented X3.  Speech normal, gait appropriate for age and unassisted Psych--  Cognition and judgment appear intact.  Cooperative with normal attention span and concentration.  Behavior appropriate. No anxious or depressed appearing.     Assessment     Assessment  Prediabetes HTN dc amlodipine 05-2015 dt edema Leg edema, worse on the left, neg [place] for  DVT 01/2018 Hyperlipidemia Hematospermia 1 ( 12-2014) -- saw urology 09-2015, RTC prn Syncope 2002 Skin cancer   PLAN HTN: See last visit, BP was elevated, he was on HCTZ, losartan.  Spironolactone was added.  No side effects, good compliance.  Ambulatory BPs better, today at home BP was normal.  At the office today 128/86. Plan: Continue present care, RF spironolactone with results, check a BMP and CBC Diabetes: Check A1c COVID-19: Encouraged good precautions If he continue to feel well, and labs are okay, RTC 03-2019 CPX

## 2018-11-29 NOTE — Progress Notes (Signed)
Pre visit review using our clinic review tool, if applicable. No additional management support is needed unless otherwise documented below in the visit note. 

## 2018-11-29 NOTE — Patient Instructions (Signed)
GO TO THE LAB : Get the blood work     GO TO THE FRONT DESK Schedule your next appointment     Check the  blood pressure 2 or 3 times a week  Be sure your blood pressure is between 110/65 and  135/85. If it is consistently higher or lower, let me know

## 2018-11-30 NOTE — Assessment & Plan Note (Signed)
HTN: See last visit, BP was elevated, he was on HCTZ, losartan.  Spironolactone was added.  No side effects, good compliance.  Ambulatory BPs better, today at home BP was normal.  At the office today 128/86. Plan: Continue present care, RF spironolactone with results, check a BMP and CBC Diabetes: Check A1c COVID-19: Encouraged good precautions If he continue to feel well, and labs are okay, RTC 03-2019 CPX

## 2018-12-01 MED ORDER — SPIRONOLACTONE 25 MG PO TABS: 25.0000 mg | ORAL_TABLET | Freq: Every day | ORAL | 1 refills | Status: DC

## 2018-12-01 NOTE — Addendum Note (Signed)
Addended byDamita Dunnings D on: 12/01/2018 08:20 AM   Modules accepted: Orders

## 2019-01-03 NOTE — Progress Notes (Addendum)
Virtual Visit via Video Note  I connected with patient on 01/04/19 at  9:00 AM EDT by audio enabled telemedicine application and verified that I am speaking with the correct person using two identifiers.   THIS ENCOUNTER IS A VIRTUAL VISIT DUE TO COVID-19 - PATIENT WAS NOT SEEN IN THE OFFICE. PATIENT HAS CONSENTED TO VIRTUAL VISIT / TELEMEDICINE VISIT   Location of patient: home  Location of provider: office  I discussed the limitations of evaluation and management by telemedicine and the availability of in person appointments. The patient expressed understanding and agreed to proceed.   Subjective:   Nicholas Burgess is a 78 y.o. male who presents for Medicare Annual/Subsequent preventive examination.  Review of Systems: No ROS.  Medicare Wellness Virtual Visit.  Visual/audio telehealth visit, UTA vital signs.   See social history for additional risk factors. Cardiac Risk Factors include: dyslipidemia;male gender;hypertension;advanced age (>27men, >55 women) Sleep patterns: no issues Home Safety/Smoke Alarms: Feels safe in home. Smoke alarms in place.  Lives with wife in 1 story home. Walk in shower with hand rails.  Male:   CCS- 04/15/11.  10 yr recall PSA-  Lab Results  Component Value Date   PSA 2.99 03/28/2017   PSA 2.16 12/13/2014   PSA 2.56 09/17/2013       Objective:    Vitals: BP 135/84 Comment: pt reported    Advanced Directives 01/04/2019 02/09/2017  Does Patient Have a Medical Advance Directive? Yes No  Type of Advance Directive Living will -  Does patient want to make changes to medical advance directive? No - Patient declined -  Would patient like information on creating a medical advance directive? - No - Patient declined    Tobacco Social History   Tobacco Use  Smoking Status Former Smoker  . Quit date: 07/12/1980  . Years since quitting: 38.5  Smokeless Tobacco Never Used  Tobacco Comment   smoked 1959-1982, up to 1/2 ppd     Counseling given: Not  Answered Comment: smoked 1959-1982, up to 1/2 ppd   Clinical Intake: Pain : No/denies pain   Past Medical History:  Diagnosis Date  . Diverticulosis of colon   . Erectile dysfunction due to arterial insufficiency   . Hematospermia   . Hx of colonic polyps 2007  . Hyperlipidemia   . Hypertension   . Syncope 2002   Past Surgical History:  Procedure Laterality Date  . APPENDECTOMY  1992  . CATARACT EXTRACTION Bilateral 06/13/2017  . COLONOSCOPY W/ POLYPECTOMY  2007 & 2012   Dr.Perry ; hyperplastic  . INGUINAL HERNIA REPAIR  2009   bilateral  . MANDIBLE FRACTURE SURGERY  1964   Family History  Problem Relation Age of Onset  . Heart failure Father   . Hypertension Father   . Hypertension Mother   . Alzheimer's disease Mother   . Cancer Mother        GYN  . Diverticulitis Brother   . Diabetes Sister   . Heart attack Brother 21  . Colon cancer Neg Hx   . Stomach cancer Neg Hx   . Rectal cancer Neg Hx   . Stroke Neg Hx   . Prostate cancer Neg Hx    Social History   Socioeconomic History  . Marital status: Married    Spouse name: Not on file  . Number of children: 2  . Years of education: Not on file  . Highest education level: Not on file  Occupational History  . Occupation: retired Technical sales engineer  Sales     Employer: [employer]  Social Needs  . Financial resource strain: Not on file  . Food insecurity    Worry: Not on file    Inability: Not on file  . Transportation needs    Medical: Not on file    Non-medical: Not on file  Tobacco Use  . Smoking status: Former Smoker    Quit date: 07/12/1980    Years since quitting: 38.5  . Smokeless tobacco: Never Used  . Tobacco comment: smoked 1959-1982, up to 1/2 ppd  Substance and Sexual Activity  . Alcohol use: Yes    Comment:  socially  . Drug use: No  . Sexual activity: Not on file  Lifestyle  . Physical activity    Days per week: Not on file    Minutes per session: Not on file  . Stress: Not on file   Relationships  . Social Herbalist on phone: Not on file    Gets together: Not on file    Attends religious service: Not on file    Active member of club or organization: Not on file    Attends meetings of clubs or organizations: Not on file    Relationship status: Not on file  Other Topics Concern  . Not on file  Social History Narrative   Household pt and wife; she has dementia, pt takes care of here   One child in [place], another in [place]    Outpatient Encounter Medications as of 01/04/2019  Medication Sig  . acetaminophen (TYLENOL) 500 MG tablet Take 500 mg by mouth every 6 (six) hours as needed for pain. For headache pain  . aspirin 81 MG tablet Take 81 mg by mouth daily.    Marland Kitchen atorvastatin (LIPITOR) 20 MG tablet Take 1 tablet (20 mg total) by mouth daily.  . hydrochlorothiazide (HYDRODIURIL) 25 MG tablet Take 1 tablet (25 mg total) by mouth daily.  Marland Kitchen losartan (COZAAR) 100 MG tablet TAKE 1 TABLET BY MOUTH DAILY.  . metoprolol succinate (TOPROL-XL) 100 MG 24 hr tablet Take 1.5 tablets a day  . Multiple Vitamin (MULTIVITAMIN) tablet Take 1 tablet by mouth daily.    . Omega-3 Fatty Acids (FISH OIL) 1000 MG CAPS Take 1,000 mg by mouth daily.    Marland Kitchen PROLENSA 0.07 % SOLN Take as directed  . spironolactone (ALDACTONE) 25 MG tablet Take 1 tablet (25 mg total) by mouth daily.   No facility-administered encounter medications on file as of 01/04/2019.     Activities of Daily Living In your present state of health, do you have any difficulty performing the following activities: 01/04/2019 03/29/2018  Hearing? N N  Vision? N N  Difficulty concentrating or making decisions? N N  Walking or climbing stairs? N N  Dressing or bathing? N N  Doing errands, shopping? N N  Preparing Food and eating ? N -  Using the Toilet? N -  In the past six months, have you accidently leaked urine? N -  Do you have problems with loss of bowel control? N -  Managing your Medications? N -  Managing  your Finances? N -  Housekeeping or managing your Housekeeping? N -  Some recent data might be hidden    Patient Care Team: Colon Branch, MD as PCP - General (Internal Medicine) Franchot Gallo, MD as Consulting Physician (Urology) Sydnee Levans, MD as Consulting Physician (Dermatology)   Assessment:   This is a routine wellness examination for Nicholas Burgess. Physical  assessment deferred to PCP.  Exercise Activities and Dietary recommendations Current Exercise Habits: Home exercise routine, Type of exercise: walking, Time (Minutes): 15, Frequency (Times/Week): 2, Weekly Exercise (Minutes/Week): 30, Exercise limited by: None identified Diet (meal preparation, eat out, water intake, caffeinated beverages, dairy products, fruits and vegetables): well balanced, on average, 3 meals per day. Needs to drink more water.    Goals    . DIET - EAT MORE FRUITS AND VEGETABLES    . DIET - INCREASE WATER INTAKE    . Increase physical activity       Fall Risk Fall Risk  01/04/2019 03/29/2018 02/08/2017 11/26/2015 03/18/2015  Falls in the past year? 0 No No No No    Depression Screen PHQ 2/9 Scores 01/04/2019 03/29/2018 02/08/2017 11/26/2015  PHQ - 2 Score 0 0 0 0    Cognitive Function Ad8 score reviewed for issues:  Issues making decisions:no  Less interest in hobbies / activities:no  Repeats questions, stories (family complaining):no  Trouble using ordinary gadgets (microwave, computer, phone):no  Forgets the month or year: no  Mismanaging finances: no  Remembering appts:no  Daily problems with thinking and/or memory:no Ad8 score is=0        Immunization History  Administered Date(s) Administered  . Pneumococcal Conjugate-13 11/26/2015  . Pneumococcal Polysaccharide-23 09/20/2014  . Tetanus 01/10/2013  . Zoster 07/09/2010    Screening Tests Health Maintenance  Topic Date Due  . INFLUENZA VACCINE  02/10/2019  . TETANUS/TDAP  01/11/2023  . PNA vac Low Risk Adult  Completed      Plan:   See you next year!  Continue to eat heart healthy diet (full of fruits, vegetables, whole grains, lean protein, water--limit salt, fat, and sugar intake) and increase physical activity as tolerated.  Continue doing brain stimulating activities (puzzles, reading, adult coloring books, staying active) to keep memory sharp.   Bring a copy of your living will and/or healthcare power of attorney to your next office visit.   I have personally reviewed and noted the following in the patient's chart:   . Medical and social history . Use of alcohol, tobacco or illicit drugs  . Current medications and supplements . Functional ability and status . Nutritional status . Physical activity . Advanced directives . List of other physicians . Hospitalizations, surgeries, and ER visits in previous 12 months . Vitals . Screenings to include cognitive, depression, and falls . Referrals and appointments  In addition, I have reviewed and discussed with patient certain preventive protocols, quality metrics, and best practice recommendations. A written personalized care plan for preventive services as well as general preventive health recommendations were provided to patient.     Naaman Plummer [place], [place]  01/04/2019  Kathlene November, MD

## 2019-01-04 ENCOUNTER — Encounter: Payer: Self-pay | Admitting: *Deleted

## 2019-01-04 ENCOUNTER — Ambulatory Visit (INDEPENDENT_AMBULATORY_CARE_PROVIDER_SITE_OTHER): Payer: Medicare Other | Admitting: *Deleted

## 2019-01-04 ENCOUNTER — Other Ambulatory Visit: Payer: Self-pay

## 2019-01-04 VITALS — BP 135/84

## 2019-01-04 DIAGNOSIS — Z0000 Encounter for general adult medical examination without abnormal findings: Secondary | ICD-10-CM

## 2019-01-04 NOTE — Patient Instructions (Signed)
See you next year!  Continue to eat heart healthy diet (full of fruits, vegetables, whole grains, lean protein, water--limit salt, fat, and sugar intake) and increase physical activity as tolerated.  Continue doing brain stimulating activities (puzzles, reading, adult coloring books, staying active) to keep memory sharp.   Bring a copy of your living will and/or healthcare power of attorney to your next office visit.   Nicholas Burgess , Thank you for taking time to come for your Medicare Wellness Visit. I appreciate your ongoing commitment to your health goals. Please review the following plan we discussed and let me know if I can assist you in the future.   These are the goals we discussed: Goals    . DIET - EAT MORE FRUITS AND VEGETABLES    . DIET - INCREASE WATER INTAKE    . Increase physical activity       This is a list of the screening recommended for you and due dates:  Health Maintenance  Topic Date Due  . Flu Shot  02/10/2019  . Tetanus Vaccine  01/11/2023  . Pneumonia vaccines  Completed    Health Maintenance After Age 37 After age 77, you are at a higher risk for certain long-term diseases and infections as well as injuries from falls. Falls are a major cause of broken bones and head injuries in people who are older than age 3. Getting regular preventive care can help to keep you healthy and well. Preventive care includes getting regular testing and making lifestyle changes as recommended by your health care provider. Talk with your health care provider about:  Which screenings and tests you should have. A screening is a test that checks for a disease when you have no symptoms.  A diet and exercise plan that is right for you. What should I know about screenings and tests to prevent falls? Screening and testing are the best ways to find a health problem early. Early diagnosis and treatment give you the best chance of managing medical conditions that are common after age 52.  Certain conditions and lifestyle choices may make you more likely to have a fall. Your health care provider may recommend:  Regular vision checks. Poor vision and conditions such as cataracts can make you more likely to have a fall. If you wear glasses, make sure to get your prescription updated if your vision changes.  Medicine review. Work with your health care provider to regularly review all of the medicines you are taking, including over-the-counter medicines. Ask your health care provider about any side effects that may make you more likely to have a fall. Tell your health care provider if any medicines that you take make you feel dizzy or sleepy.  Osteoporosis screening. Osteoporosis is a condition that causes the bones to get weaker. This can make the bones weak and cause them to break more easily.  Blood pressure screening. Blood pressure changes and medicines to control blood pressure can make you feel dizzy.  Strength and balance checks. Your health care provider may recommend certain tests to check your strength and balance while standing, walking, or changing positions.  Foot health exam. Foot pain and numbness, as well as not wearing proper footwear, can make you more likely to have a fall.  Depression screening. You may be more likely to have a fall if you have a fear of falling, feel emotionally low, or feel unable to do activities that you used to do.  Alcohol use screening. Using too  much alcohol can affect your balance and may make you more likely to have a fall. What actions can I take to lower my risk of falls? General instructions  Talk with your health care provider about your risks for falling. Tell your health care provider if: ? You fall. Be sure to tell your health care provider about all falls, even ones that seem minor. ? You feel dizzy, sleepy, or off-balance.  Take over-the-counter and prescription medicines only as told by your health care provider. These  include any supplements.  Eat a healthy diet and maintain a healthy weight. A healthy diet includes low-fat dairy products, low-fat (lean) meats, and fiber from whole grains, beans, and lots of fruits and vegetables. Home safety  Remove any tripping hazards, such as rugs, cords, and clutter.  Install safety equipment such as grab bars in bathrooms and safety rails on stairs.  Keep rooms and walkways well-lit. Activity   Follow a regular exercise program to stay fit. This will help you maintain your balance. Ask your health care provider what types of exercise are appropriate for you.  If you need a cane or walker, use it as recommended by your health care provider.  Wear supportive shoes that have nonskid soles. Lifestyle  Do not drink alcohol if your health care provider tells you not to drink.  If you drink alcohol, limit how much you have: ? 0-1 drink a day for women. ? 0-2 drinks a day for men.  Be aware of how much alcohol is in your drink. In the [place] one drink equals one typical bottle of beer (12 oz), one-half glass of wine (5 oz), or one shot of hard liquor (1 oz).  Do not use any products that contain nicotine or tobacco, such as cigarettes and e-cigarettes. If you need help quitting, ask your health care provider. Summary  Having a healthy lifestyle and getting preventive care can help to protect your health and wellness after age 59.  Screening and testing are the best way to find a health problem early and help you avoid having a fall. Early diagnosis and treatment give you the best chance for managing medical conditions that are more common for people who are older than age 51.  Falls are a major cause of broken bones and head injuries in people who are older than age 47. Take precautions to prevent a fall at home.  Work with your health care provider to learn what changes you can make to improve your health and wellness and to prevent falls. This information is  not intended to replace advice given to you by your health care provider. Make sure you discuss any questions you have with your health care provider. Document Released: 05/11/2017 Document Revised: 05/11/2017 Document Reviewed: 05/11/2017 Elsevier Interactive Patient Education  2019 [employer].

## 2019-03-20 ENCOUNTER — Other Ambulatory Visit: Payer: Self-pay | Admitting: Internal Medicine

## 2019-04-03 ENCOUNTER — Other Ambulatory Visit: Payer: Self-pay

## 2019-04-03 ENCOUNTER — Encounter: Payer: Self-pay | Admitting: Internal Medicine

## 2019-04-03 ENCOUNTER — Ambulatory Visit (INDEPENDENT_AMBULATORY_CARE_PROVIDER_SITE_OTHER): Payer: Medicare Other | Admitting: Internal Medicine

## 2019-04-03 VITALS — BP 130/81 | HR 65 | Temp 97.0°F | Resp 12 | Ht 74.0 in | Wt 259.6 lb

## 2019-04-03 DIAGNOSIS — Z0000 Encounter for general adult medical examination without abnormal findings: Secondary | ICD-10-CM

## 2019-04-03 DIAGNOSIS — R739 Hyperglycemia, unspecified: Secondary | ICD-10-CM | POA: Diagnosis not present

## 2019-04-03 DIAGNOSIS — I10 Essential (primary) hypertension: Secondary | ICD-10-CM

## 2019-04-03 LAB — COMPREHENSIVE METABOLIC PANEL
ALT: 30 U/L (ref 0–53)
AST: 22 U/L (ref 0–37)
Albumin: 4.1 g/dL (ref 3.5–5.2)
Alkaline Phosphatase: 74 U/L (ref 39–117)
BUN: 13 mg/dL (ref 6–23)
CO2: 29 mEq/L (ref 19–32)
Calcium: 9.7 mg/dL (ref 8.4–10.5)
Chloride: 99 mEq/L (ref 96–112)
Creatinine, Ser: 0.81 mg/dL (ref 0.40–1.50)
GFR: 92.44 mL/min (ref >60.00)
Glucose, Bld: 115 mg/dL — ABNORMAL HIGH (ref 70–99)
Potassium: 4 mEq/L (ref 3.5–5.1)
Sodium: 136 mEq/L (ref 135–145)
Total Bilirubin: 0.6 mg/dL (ref 0.2–1.2)
Total Protein: 7.1 g/dL (ref 6.0–8.3)

## 2019-04-03 LAB — LDL CHOLESTEROL, DIRECT: Direct LDL: 95 mg/dL

## 2019-04-03 LAB — LIPID PANEL
Cholesterol: 163 mg/dL (ref 0–200)
HDL: 36.9 mg/dL — ABNORMAL LOW (ref >39.00)
NonHDL: 126.37
Total CHOL/HDL Ratio: 4
Triglycerides: 264 mg/dL — ABNORMAL HIGH (ref 0.0–149.0)
VLDL: 52.8 mg/dL — ABNORMAL HIGH (ref 0.0–40.0)

## 2019-04-03 LAB — HEMOGLOBIN A1C: Hgb A1c MFr Bld: 6.4 % (ref 4.6–6.5)

## 2019-04-03 LAB — TSH: TSH: 3.89 u[IU]/mL (ref 0.35–4.50)

## 2019-04-03 NOTE — Assessment & Plan Note (Signed)
Prediabetes: Check A1c, lifestyle discussed. HTN: Continue HCTZ, losartan, Aldactone.  Recommend ambulatory BPs.  Checking labs Hyperlipidemia: On Lipitor, checking labs RTC 6 months

## 2019-04-03 NOTE — Patient Instructions (Signed)
GO TO THE LAB : Get the blood work     GO TO THE FRONT DESK Schedule your next appointment   for routine checkup in 6 months   Check the  blood pressure 2 or 3 times a month  BP GOAL is between 110/65 and  135/85. If it is consistently higher or lower, let me know

## 2019-04-03 NOTE — Progress Notes (Signed)
Subjective:    Patient ID: Nicholas Burgess, male    DOB: 25-May-1941, 78 y.o.   MRN: [id number]  DOS:  04/03/2019 Type of visit - description: Here for CPX  No concerns  Review of Systems  A 14 point review of systems is negative   Past Medical History:  Diagnosis Date  . Diverticulosis of colon   . Erectile dysfunction due to arterial insufficiency   . Hematospermia   . Hx of colonic polyps 2007  . Hyperlipidemia   . Hypertension   . Syncope 2002    Past Surgical History:  Procedure Laterality Date  . APPENDECTOMY  1992  . CATARACT EXTRACTION Bilateral 06/13/2017  . COLONOSCOPY W/ POLYPECTOMY  2007 & 2012   Dr.Perry ; hyperplastic  . INGUINAL HERNIA REPAIR  2009   bilateral  . [place]    Social History   Socioeconomic History  . Marital status: Married    Spouse name: Not on file  . Number of children: 2  . Years of education: Not on file  . Highest education level: Not on file  Occupational History  . Occupation: retired Music therapist: [employer]  . Financial resource strain: Not on file  . Food insecurity    Worry: Not on file    Inability: Not on file  . Transportation needs    Medical: Not on file    Non-medical: Not on file  Tobacco Use  . Smoking status: Former Smoker    Quit date: 07/12/1980    Years since quitting: 38.7  . Smokeless tobacco: Never Used  . Tobacco comment: smoked 1959-1982, up to 1/2 ppd  Substance and Sexual Activity  . Alcohol use: Yes    Comment:  socially  . Drug use: No  . Sexual activity: Not on file  Lifestyle  . Physical activity    Days per week: Not on file    Minutes per session: Not on file  . Stress: Not on file  Relationships  . Social Herbalist on phone: Not on file    Gets together: Not on file    Attends religious service: Not on file    Active member of club or organization: Not on file    Attends meetings of clubs or  organizations: Not on file    Relationship status: Not on file  . Intimate partner violence    Fear of current or ex partner: Not on file    Emotionally abused: Not on file    Physically abused: Not on file    Forced sexual activity: Not on file  Other Topics Concern  . Not on file  Social History Narrative   Household pt and wife; she has dementia, pt takes care of here   One child in [place], another in [place]     Family History  Problem Relation Age of Onset  . Heart failure Father   . Hypertension Father   . Hypertension Mother   . Alzheimer's disease Mother   . Cancer Mother        GYN  . Diverticulitis Brother   . Diabetes Sister   . Heart attack Brother 75  . Colon cancer Neg Hx   . Stomach cancer Neg Hx   . Rectal cancer Neg Hx   . Stroke Neg Hx   . Prostate cancer Neg Hx      Allergies as of 04/03/2019  No Known Allergies     Medication List       Accurate as of April 03, 2019 11:14 AM. If you have any questions, ask your nurse or doctor.        acetaminophen 500 MG tablet Commonly known as: TYLENOL Take 500 mg by mouth every 6 (six) hours as needed for pain. For headache pain   aspirin 81 MG tablet Take 81 mg by mouth daily.   atorvastatin 20 MG tablet Commonly known as: LIPITOR Take 1 tablet (20 mg total) by mouth daily.   Fish Oil 1000 MG Caps Take 1,000 mg by mouth daily.   hydrochlorothiazide 25 MG tablet Commonly known as: HYDRODIURIL Take 1 tablet (25 mg total) by mouth daily.   losartan 100 MG tablet Commonly known as: COZAAR TAKE 1 TABLET BY MOUTH DAILY.   metoprolol succinate 100 MG 24 hr tablet Commonly known as: TOPROL-XL Take 1.5 tablets a day   multivitamin tablet Take 1 tablet by mouth daily.   Prolensa 0.07 % Soln Generic drug: Bromfenac Sodium Take as directed   spironolactone 25 MG tablet Commonly known as: ALDACTONE Take 1 tablet (25 mg total) by mouth daily.           Objective:   Physical Exam BP  130/81 (BP Location: Right Arm, Cuff Size: Large)   Pulse 65   Temp (!) 97 F (36.1 C) (Temporal)   Resp 12   Ht 6\' 2"  (1.88 m)   Wt 259 lb 9.6 oz (117.8 kg)   SpO2 97%   BMI 33.33 kg/m  General: Well developed, NAD, BMI noted Neck: No  thyromegaly.  Normal carotid pulses HEENT:  Normocephalic . Face symmetric, atraumatic Lungs:  CTA B Normal respiratory effort, no intercostal retractions, no accessory muscle use. Heart: RRR,  no murmur.  No pretibial edema bilaterally  Abdomen:  Not distended, soft, non-tender. No rebound or rigidity.   Skin: Exposed areas without rash. Not pale. Not jaundice Neurologic:  alert & oriented X3.  Speech normal, gait appropriate for age and unassisted Strength symmetric and appropriate for age.  Psych: Cognition and judgment appear intact.  Cooperative with normal attention span and concentration.  Behavior appropriate. No anxious or depressed appearing.     Assessment    Assessment  Prediabetes HTN dc amlodipine 05-2015 dt edema Leg edema, worse on the left, neg [place] for  DVT 01/2018 Hyperlipidemia Hematospermia 1 ( 12-2014) -- saw urology 09-2015, RTC prn Syncope 2002 Skin cancer   PLAN Prediabetes: Check A1c, lifestyle discussed. HTN: Continue HCTZ, losartan, Aldactone.  Recommend ambulatory BPs.  Checking labs Hyperlipidemia: On Lipitor, checking labs RTC 6 months

## 2019-04-03 NOTE — Assessment & Plan Note (Addendum)
-  Td 2014 - PNA 2016 -Prevnar 11-2015 - Shingles 06/2010 - shingrex: Declined it for now. - flu shot discussed. -CCS--04/2011--Dr Perry--neg  -Prostate cancer screening discussed, see   Entry from 2019, no further screening -diet and exercise discussed -Labs: CMP, FLP, A1c, TSH

## 2019-04-23 ENCOUNTER — Other Ambulatory Visit: Payer: Self-pay | Admitting: Internal Medicine

## 2019-06-04 ENCOUNTER — Other Ambulatory Visit: Payer: Self-pay | Admitting: Internal Medicine

## 2019-06-06 ENCOUNTER — Ambulatory Visit (INDEPENDENT_AMBULATORY_CARE_PROVIDER_SITE_OTHER): Payer: Medicare Other | Admitting: Internal Medicine

## 2019-06-06 ENCOUNTER — Encounter: Payer: Self-pay | Admitting: Internal Medicine

## 2019-06-06 ENCOUNTER — Other Ambulatory Visit: Payer: Self-pay

## 2019-06-06 VITALS — Ht 74.0 in

## 2019-06-06 DIAGNOSIS — Z538 Procedure and treatment not carried out for other reasons: Secondary | ICD-10-CM

## 2019-06-06 NOTE — Progress Notes (Deleted)
Pre visit review using our clinic review tool, if applicable. No additional management support is needed unless otherwise documented below in the visit note. 

## 2019-06-11 ENCOUNTER — Other Ambulatory Visit: Payer: Self-pay

## 2019-06-12 ENCOUNTER — Other Ambulatory Visit: Payer: Self-pay

## 2019-06-12 ENCOUNTER — Ambulatory Visit: Payer: Medicare Other | Admitting: Internal Medicine

## 2019-06-12 ENCOUNTER — Encounter: Payer: Self-pay | Admitting: Internal Medicine

## 2019-06-12 VITALS — BP 132/95 | HR 76 | Temp 96.7°F | Resp 18 | Ht 74.0 in | Wt 260.4 lb

## 2019-06-12 DIAGNOSIS — L989 Disorder of the skin and subcutaneous tissue, unspecified: Secondary | ICD-10-CM

## 2019-06-12 NOTE — Progress Notes (Signed)
Pre visit review using our clinic review tool, if applicable. No additional management support is needed unless otherwise documented below in the visit note. 

## 2019-06-12 NOTE — Progress Notes (Signed)
Subjective:    Patient ID: Nicholas Burgess, male    DOB: [birth year], 78 y.o.   MRN: [id number]  DOS:  06/12/2019 Type of visit - description: Acute visit Noted a skin lesion about 4 months ago. Does not recall how it started, he is not sure if he had a cut or open wound there. Currently is not itching or hurting. Has never bled, never seen discharge    Review of Systems As above  Past Medical History:  Diagnosis Date  . Diverticulosis of colon   . Erectile dysfunction due to arterial insufficiency   . Hematospermia   . Hx of colonic polyps 2007  . Hyperlipidemia   . Hypertension   . Syncope 2002    Past Surgical History:  Procedure Laterality Date  . APPENDECTOMY  1992  . CATARACT EXTRACTION Bilateral 06/13/2017  . COLONOSCOPY W/ POLYPECTOMY  2007 & 2012   Dr.Perry ; hyperplastic  . INGUINAL HERNIA REPAIR  2009   bilateral  . [place]    Social History   Socioeconomic History  . Marital status: Married    Spouse name: Not on file  . Number of children: 2  . Years of education: Not on file  . Highest education level: Not on file  Occupational History  . Occupation: retired Music therapist: [employer]  . Financial resource strain: Not on file  . Food insecurity    Worry: Not on file    Inability: Not on file  . Transportation needs    Medical: Not on file    Non-medical: Not on file  Tobacco Use  . Smoking status: Former Smoker    Quit date: 07/12/1980    Years since quitting: 38.9  . Smokeless tobacco: Never Used  . Tobacco comment: smoked 1959-1982, up to 1/2 ppd  Substance and Sexual Activity  . Alcohol use: Yes    Comment:  socially  . Drug use: No  . Sexual activity: Not on file  Lifestyle  . Physical activity    Days per week: Not on file    Minutes per session: Not on file  . Stress: Not on file  Relationships  . Social Herbalist on phone: Not on file    Gets together:  Not on file    Attends religious service: Not on file    Active member of club or organization: Not on file    Attends meetings of clubs or organizations: Not on file    Relationship status: Not on file  . Intimate partner violence    Fear of current or ex partner: Not on file    Emotionally abused: Not on file    Physically abused: Not on file    Forced sexual activity: Not on file  Other Topics Concern  . Not on file  Social History Narrative   Household pt and wife; she has dementia, pt takes care of here   One child in [place], another in [place] as of 06/12/2019   No Known Allergies     Medication List       Accurate as of June 12, 2019 11:59 PM. If you have any questions, ask your nurse or doctor.        acetaminophen 500 MG tablet Commonly known as: TYLENOL Take 500 mg by mouth every 6 (six) hours as needed for pain. For headache pain  aspirin 81 MG tablet Take 81 mg by mouth daily.   atorvastatin 20 MG tablet Commonly known as: LIPITOR Take 1 tablet (20 mg total) by mouth daily.   Fish Oil 1000 MG Caps Take 1,000 mg by mouth daily.   hydrochlorothiazide 25 MG tablet Commonly known as: HYDRODIURIL Take 1 tablet (25 mg total) by mouth daily.   losartan 100 MG tablet Commonly known as: COZAAR TAKE 1 TABLET BY MOUTH DAILY.   metoprolol succinate 100 MG 24 hr tablet Commonly known as: TOPROL-XL Take 1.5 tablets a day   multivitamin tablet Take 1 tablet by mouth daily.   Prolensa 0.07 % Soln Generic drug: Bromfenac Sodium Take as directed   spironolactone 25 MG tablet Commonly known as: ALDACTONE Take 1 tablet (25 mg total) by mouth daily.           Objective:   Physical Exam BP (!) 132/95 (BP Location: Left Arm, Patient Position: Sitting, Cuff Size: Normal)   Pulse 76   Temp (!) 96.7 F (35.9 C) (Temporal)   Resp 18   Ht 6\' 2"  (1.88 m)   Wt 260 lb 6 oz (118.1 kg)   SpO2 97%   BMI 33.43 kg/m  General:   Well  developed, NAD, BMI noted. HEENT:  Normocephalic . Face symmetric, atraumatic  Skin: Has hyperpigmented lesion at the right leg, lateral aspect of the pretibial area.  Skin is dry.  Is not tender.  See picture Neurologic:  alert & oriented X3.  Speech normal, gait appropriate for age and unassisted Psych--  Cognition and judgment appear intact.  Cooperative with normal attention span and concentration.  Behavior appropriate. No anxious or depressed appearing.        Assessment     Assessment  Prediabetes HTN dc amlodipine 05-2015 dt edema Leg edema, worse on the left, neg [place] for  DVT 01/2018 Hyperlipidemia Hematospermia 1 ( 12-2014) -- saw urology 09-2015, RTC prn Syncope 2002 Skin cancer   PLAN Skin lesion: Etiology not completely clear.  Will refer to dermatology.   This visit occurred during the SARS-CoV-2 public health emergency.  Safety protocols were in place, including screening questions prior to the visit, additional usage of staff PPE, and extensive cleaning of exam room while observing appropriate contact time as indicated for disinfecting solutions.

## 2019-06-13 NOTE — Assessment & Plan Note (Signed)
Skin lesion: Etiology not completely clear.  Will refer to dermatology.

## 2019-06-18 ENCOUNTER — Other Ambulatory Visit: Payer: Self-pay | Admitting: Internal Medicine

## 2019-06-29 DIAGNOSIS — D485 Neoplasm of uncertain behavior of skin: Secondary | ICD-10-CM | POA: Diagnosis not present

## 2019-07-23 ENCOUNTER — Other Ambulatory Visit: Payer: Self-pay | Admitting: Internal Medicine

## 2019-08-31 ENCOUNTER — Ambulatory Visit: Payer: Medicare Other | Admitting: Family Medicine

## 2019-09-03 ENCOUNTER — Other Ambulatory Visit: Payer: Self-pay | Admitting: Internal Medicine

## 2019-09-05 ENCOUNTER — Ambulatory Visit: Payer: Medicare Other | Admitting: Family Medicine

## 2019-09-28 ENCOUNTER — Other Ambulatory Visit: Payer: Self-pay

## 2019-10-01 ENCOUNTER — Ambulatory Visit: Payer: Medicare Other | Admitting: Internal Medicine

## 2019-10-01 DIAGNOSIS — Z0289 Encounter for other administrative examinations: Secondary | ICD-10-CM

## 2019-10-15 ENCOUNTER — Other Ambulatory Visit: Payer: Self-pay

## 2019-10-15 ENCOUNTER — Encounter: Payer: Self-pay | Admitting: Internal Medicine

## 2019-10-15 ENCOUNTER — Ambulatory Visit (INDEPENDENT_AMBULATORY_CARE_PROVIDER_SITE_OTHER): Payer: Medicare Other | Admitting: Internal Medicine

## 2019-10-15 VITALS — BP 114/75 | HR 74 | Temp 96.4°F | Resp 18 | Ht 74.0 in | Wt 261.1 lb

## 2019-10-15 DIAGNOSIS — R739 Hyperglycemia, unspecified: Secondary | ICD-10-CM | POA: Diagnosis not present

## 2019-10-15 DIAGNOSIS — E785 Hyperlipidemia, unspecified: Secondary | ICD-10-CM | POA: Diagnosis not present

## 2019-10-15 DIAGNOSIS — G3184 Mild cognitive impairment, so stated: Secondary | ICD-10-CM | POA: Diagnosis not present

## 2019-10-15 DIAGNOSIS — I10 Essential (primary) hypertension: Secondary | ICD-10-CM | POA: Diagnosis not present

## 2019-10-15 MED ORDER — DONEPEZIL HCL 5 MG PO TABS: 5.0000 mg | ORAL_TABLET | Freq: Every day | ORAL | 6 refills | Status: DC

## 2019-10-15 NOTE — Progress Notes (Signed)
Subjective:    Patient ID: Nicholas Burgess, male    DOB: 1940/08/22, 79 y.o.   MRN: [id number]  DOS:  10/15/2019 Type of visit - description: Acute, here with his son Nicholas Burgess has noted a gradual deterioration of pt's memory. For instance, his well know barber died 3 months ago and Aarsh thinks is only 3 weeks ago. The patient himself has noted no issues. He lives with his wife, he pay his own bills and manage his affairs without apparent problems, still drives, has not gotten lost    Review of Systems Denies fever chills No weight loss.  No unusual aches or pains Denies anxiety or depression No headache, dizziness, diplopia or any other strokelike symptoms. Denies taking any over-the-counter sleeping aids, very infrequent alcohol intake.  Past Medical History:  Diagnosis Date  . Diverticulosis of colon   . Erectile dysfunction due to arterial insufficiency   . Hematospermia   . Hx of colonic polyps 2007  . Hyperlipidemia   . Hypertension   . Syncope 2002    Past Surgical History:  Procedure Laterality Date  . APPENDECTOMY  1992  . CATARACT EXTRACTION Bilateral 06/13/2017  . COLONOSCOPY W/ POLYPECTOMY  2007 & 2012   Dr.Perry ; hyperplastic  . INGUINAL HERNIA REPAIR  2009   bilateral  . MANDIBLE FRACTURE SURGERY  1964    Allergies as of 10/15/2019   No Known Allergies     Medication List       Accurate as of October 15, 2019  2:20 PM. If you have any questions, ask your nurse or doctor.        acetaminophen 500 MG tablet Commonly known as: TYLENOL Take 500 mg by mouth every 6 (six) hours as needed for pain. For headache pain   aspirin 81 MG tablet Take 81 mg by mouth daily.   atorvastatin 20 MG tablet Commonly known as: LIPITOR Take 1 tablet (20 mg total) by mouth daily.   Fish Oil 1000 MG Caps Take 1,000 mg by mouth daily.   hydrochlorothiazide 25 MG tablet Commonly known as: HYDRODIURIL Take 1 tablet (25 mg total) by mouth daily.   losartan 100  MG tablet Commonly known as: COZAAR TAKE 1 TABLET BY MOUTH DAILY.   metoprolol succinate 100 MG 24 hr tablet Commonly known as: TOPROL-XL TAKE 1 AND 1/2 TABLETS BY MOUTH ONCE DAILY   multivitamin tablet Take 1 tablet by mouth daily.   Prolensa 0.07 % Soln Generic drug: Bromfenac Sodium Take as directed   spironolactone 25 MG tablet Commonly known as: ALDACTONE Take 1 tablet (25 mg total) by mouth daily.          Objective:   Physical Exam BP 114/75 (BP Location: Right Arm, Patient Position: Sitting, Cuff Size: Normal)   Pulse 74   Temp (!) 96.4 F (35.8 C) (Temporal)   Resp 18   Ht 6\' 2"  (1.88 m)   Wt 261 lb 2 oz (118.4 kg)   SpO2 95%   BMI 33.53 kg/m  General:   Well developed, NAD, BMI noted. HEENT:  Normocephalic . Face symmetric, atraumatic  Skin: Several skin lesions consistent with SKs Neurologic:  alert, cooperative, MMSE 21 Speech normal, gait appropriate for age and unassisted Psych--  Cognition and judgment appear intact.  Cooperative with normal attention span and concentration.  Behavior appropriate. No anxious or depressed appearing.      Assessment     Assessment  Prediabetes HTN dc amlodipine 05-2015 dt edema Leg  edema, worse on the left, neg [place] for  DVT 01/2018 Hyperlipidemia Hematospermia 1 ( 12-2014) -- saw urology 09-2015, RTC prn Syncope 2002 Skin cancer   PLANwith a,  MCI: MMSE 21, mild dementia, discussed treatment options including doing nothing, vs  take medication. They are familiar with dementia treatment because the patient's wife takes a medication for it. Plan: Will start Aricept 5 mg daily, watch for side effects. Stay physically and mentally active. Check 123456, folic acid and RPR Reassess in 3 months Prediabetes: Check A1c HTN: BP seems controlled, checking a BMP, CBC, continue Lipitor, HCTZ, losartan, metoprolol, Aldactone. RTC 3 months  This visit occurred during the SARS-CoV-2 public health emergency.  Safety  protocols were in place, including screening questions prior to the visit, additional usage of staff PPE, and extensive cleaning of exam room while observing appropriate contact time as indicated for disinfecting solutions.

## 2019-10-15 NOTE — Patient Instructions (Signed)
Start taking Aricept 5 mg daily  GO TO THE LAB : Get the blood work     [facility], please reschedule your appointments Come back for   for a checkup in 3 months

## 2019-10-15 NOTE — Progress Notes (Signed)
Pre visit review using our clinic review tool, if applicable. No additional management support is needed unless otherwise documented below in the visit note. 

## 2019-10-16 LAB — BASIC METABOLIC PANEL
BUN: 21 mg/dL (ref 6–23)
CO2: 28 mEq/L (ref 19–32)
Calcium: 9.5 mg/dL (ref 8.4–10.5)
Chloride: 96 mEq/L (ref 96–112)
Creatinine, Ser: 1.37 mg/dL (ref 0.40–1.50)
GFR: 50.33 mL/min — ABNORMAL LOW (ref >60.00)
Glucose, Bld: 86 mg/dL (ref 70–99)
Potassium: 4.8 mEq/L (ref 3.5–5.1)
Sodium: 134 mEq/L — ABNORMAL LOW (ref 135–145)

## 2019-10-16 LAB — CBC WITH DIFFERENTIAL/PLATELET
Basophils Absolute: 0.1 10*3/uL (ref 0.0–0.1)
Basophils Relative: 0.8 % (ref 0.0–3.0)
Eosinophils Absolute: 0.2 10*3/uL (ref 0.0–0.7)
Eosinophils Relative: 1.9 % (ref 0.0–5.0)
HCT: 39.2 % (ref 39.0–52.0)
Hemoglobin: 13.7 g/dL (ref 13.0–17.0)
Lymphocytes Relative: 28.1 % (ref 12.0–46.0)
Lymphs Abs: 2.5 10*3/uL (ref 0.7–4.0)
MCHC: 34.8 g/dL (ref 30.0–36.0)
MCV: 94.7 fl (ref 78.0–100.0)
Monocytes Absolute: 1 10*3/uL (ref 0.1–1.0)
Monocytes Relative: 10.6 % (ref 3.0–12.0)
Neutro Abs: 5.3 10*3/uL (ref 1.4–7.7)
Neutrophils Relative %: 58.6 % (ref 43.0–77.0)
Platelets: 239 10*3/uL (ref 150.0–400.0)
RBC: 4.14 Mil/uL — ABNORMAL LOW (ref 4.22–5.81)
RDW: 12.8 % (ref 11.5–15.5)
WBC: 9.1 10*3/uL (ref 4.0–10.5)

## 2019-10-16 LAB — B12 AND FOLATE PANEL
Folate: 14 ng/mL (ref >5.9)
Vitamin B-12: 295 pg/mL (ref 211–911)

## 2019-10-16 LAB — RPR: RPR Ser Ql: NONREACTIVE

## 2019-10-16 LAB — HEMOGLOBIN A1C: Hgb A1c MFr Bld: 6.4 % (ref 4.6–6.5)

## 2019-10-17 DIAGNOSIS — F0390 Unspecified dementia without behavioral disturbance: Secondary | ICD-10-CM | POA: Insufficient documentation

## 2019-10-17 DIAGNOSIS — G3184 Mild cognitive impairment, so stated: Secondary | ICD-10-CM | POA: Insufficient documentation

## 2019-10-17 NOTE — Assessment & Plan Note (Signed)
MCI: MMSE 21, mild dementia, discussed treatment options including doing nothing, vs  take medication. They are familiar with dementia treatment because the patient's wife takes a medication for it. Plan: Will start Aricept 5 mg daily, watch for side effects. Stay physically and mentally active. Check 123456, folic acid and RPR Reassess in 3 months Prediabetes: Check A1c HTN: BP seems controlled, checking a BMP, CBC, continue Lipitor, HCTZ, losartan, metoprolol, Aldactone. RTC 3 months

## 2019-10-22 ENCOUNTER — Other Ambulatory Visit: Payer: Self-pay | Admitting: Internal Medicine

## 2019-11-26 ENCOUNTER — Other Ambulatory Visit: Payer: Self-pay | Admitting: Internal Medicine

## 2019-12-17 ENCOUNTER — Other Ambulatory Visit: Payer: Self-pay | Admitting: Internal Medicine

## 2020-01-03 NOTE — Progress Notes (Signed)
I connected with Nicholas Burgess today by telephone and verified that I am speaking with the correct person using two identifiers. Location patient: home Location provider: work Persons participating in the virtual visit: patient, Marine scientist.    I discussed the limitations, risks, security and privacy concerns of performing an evaluation and management service by telephone and the availability of in person appointments. I also discussed with the patient that there may be a patient responsible charge related to this service. The patient expressed understanding and verbally consented to this telephonic visit.    Interactive audio and video telecommunications were attempted between this provider and patient, however failed, due to patient having technical difficulties OR patient did not have access to video capability.  We continued and completed visit with audio only.  Some vital signs may be absent or patient reported.    Subjective:   [name] is a 79 y.o. male who presents for Medicare Annual/Subsequent preventive examination.  Review of Systems     Cardiac Risk Factors include: advanced age (>5men, >43 women);male gender;hypertension;dyslipidemia     Objective:     Advanced Directives 01/04/2020 01/04/2019 02/09/2017  Does Patient Have a Medical Advance Directive? Yes Yes No  Type of Paramedic of [place];Living will Living will -  Does patient want to make changes to medical advance directive? No - Patient declined No - Patient declined -  Copy of [place] in Chart? No - copy requested - -  Would patient like information on creating a medical advance directive? - - No - Patient declined    Current Medications (verified) Outpatient Encounter Medications as of 01/04/2020  Medication Sig  . acetaminophen (TYLENOL) 500 MG tablet Take 500 mg by mouth every 6 (six) hours as needed for pain. For headache pain  . aspirin 81 MG tablet Take 81 mg by  mouth daily.    Marland Kitchen atorvastatin (LIPITOR) 20 MG tablet Take 1 tablet (20 mg total) by mouth daily.  Marland Kitchen donepezil (ARICEPT) 5 MG tablet Take 1 tablet (5 mg total) by mouth at bedtime.  . hydrochlorothiazide (HYDRODIURIL) 25 MG tablet Take 1 tablet (25 mg total) by mouth daily.  Marland Kitchen losartan (COZAAR) 100 MG tablet TAKE 1 TABLET BY MOUTH DAILY.  . metoprolol succinate (TOPROL-XL) 100 MG 24 hr tablet TAKE 1 AND 1/2 TABLETS BY MOUTH ONCE DAILY  . Multiple Vitamin (MULTIVITAMIN) tablet Take 1 tablet by mouth daily.    . Omega-3 Fatty Acids (FISH OIL) 1000 MG CAPS Take 1,000 mg by mouth daily.    Marland Kitchen PROLENSA 0.07 % SOLN Take as directed  . spironolactone (ALDACTONE) 25 MG tablet Take 1 tablet (25 mg total) by mouth daily.  . [DISCONTINUED] atorvastatin (LIPITOR) 20 MG tablet Take 1 tablet (20 mg total) by mouth daily.  . [DISCONTINUED] hydrochlorothiazide (HYDRODIURIL) 25 MG tablet Take 1 tablet (25 mg total) by mouth daily.  . [DISCONTINUED] spironolactone (ALDACTONE) 25 MG tablet Take 1 tablet (25 mg total) by mouth daily.   No facility-administered encounter medications on file as of 01/04/2020.    Allergies (verified) Patient has no known allergies.   History: Past Medical History:  Diagnosis Date  . Diverticulosis of colon   . Erectile dysfunction due to arterial insufficiency   . Hematospermia   . Hx of colonic polyps 2007  . Hyperlipidemia   . Hypertension   . Syncope 2002   Past Surgical History:  Procedure Laterality Date  . APPENDECTOMY  1992  . CATARACT EXTRACTION  Bilateral 06/13/2017  . COLONOSCOPY W/ POLYPECTOMY  2007 & 2012   Dr.Perry ; hyperplastic  . INGUINAL HERNIA REPAIR  2009   bilateral  . MANDIBLE FRACTURE SURGERY  1964   Family History  Problem Relation Age of Onset  . Heart failure Father   . Hypertension Father   . Hypertension Mother   . Alzheimer's disease Mother   . Cancer Mother        GYN  . Diverticulitis Brother   . Diabetes Sister   . Heart attack  Brother 26  . Colon cancer Neg Hx   . Stomach cancer Neg Hx   . Rectal cancer Neg Hx   . Stroke Neg Hx   . Prostate cancer Neg Hx    Social History   Socioeconomic History  . Marital status: Married    Spouse name: Not on file  . Number of children: 2  . Years of education: Not on file  . Highest education level: Not on file  Occupational History  . Occupation: retired Music therapist: [employer]  Tobacco Use  . Smoking status: Former Smoker    Quit date: 07/12/1980    Years since quitting: 39.5  . Smokeless tobacco: Never Used  . Tobacco comment: smoked 1959-1982, up to 1/2 ppd  Substance and Sexual Activity  . Alcohol use: Yes    Comment:  socially  . Drug use: No  . Sexual activity: Not on file  Other Topics Concern  . Not on file  Social History Narrative   Household pt and wife; she has dementia, pt takes care of here   One child in [place], another in [place] Strain:   . Difficulty of Paying Living Expenses:   Food Insecurity:   . Worried About Charity fundraiser in the Last Year:   . Arboriculturist in the Last Year:   Transportation Needs:   . Film/video editor (Medical):   Marland Kitchen Lack of Transportation (Non-Medical):   Physical Activity:   . Days of Exercise per Week:   . Minutes of Exercise per Session:   Stress:   . Feeling of Stress :   Social Connections:   . Frequency of Communication with Friends and Family:   . Frequency of Social Gatherings with Friends and Family:   . Attends Religious Services:   . Active Member of Clubs or Organizations:   . Attends Archivist Meetings:   Marland Kitchen Marital Status:     Tobacco Counseling Counseling given: Not Answered Comment: smoked [phone], up to 1/2 ppd   Clinical Intake: Pain : No/denies pain    Activities of Daily Living In your present state of health, do you have any difficulty performing the following activities:  01/04/2020 01/04/2019  Hearing? N N  Vision? N N  Difficulty concentrating or making decisions? N N  Walking or climbing stairs? N N  Dressing or bathing? N N  Doing errands, shopping? N N  Preparing Food and eating ? N N  Using the Toilet? N N  In the past six months, have you accidently leaked urine? N N  Do you have problems with loss of bowel control? N N  Managing your Medications? N N  Managing your Finances? N N  Housekeeping or managing your Housekeeping? N N  Some recent data might be hidden    Patient Care Team: Colon Branch, MD as PCP -  General (Internal Medicine) Franchot Gallo, MD as Consulting Physician (Urology) Sydnee Levans, MD as Consulting Physician (Dermatology)  Indicate any recent Medical Services you may have received from other than [facility] providers in the past year (date may be approximate).     Assessment:   This is a routine wellness examination for Aundre.   Dietary issues and exercise activities discussed: Current Exercise Habits: The patient does not participate in regular exercise at present, Exercise limited by: None identified   Diet (meal preparation, eat out, water intake, caffeinated beverages, dairy products, fruits and vegetables): well balanced    Goals    . DIET - EAT MORE FRUITS AND VEGETABLES    . DIET - INCREASE WATER INTAKE    . Increase physical activity      Depression Screen PHQ 2/9 Scores 01/04/2020 01/04/2019 03/29/2018 02/08/2017 11/26/2015 03/18/2015 09/20/2014  PHQ - 2 Score 0 0 0 0 0 0 0    Fall Risk Fall Risk  01/04/2020 06/12/2019 01/04/2019 03/29/2018 02/08/2017  Falls in the past year? 0 0 0 No No  Number falls in past yr: 0 - - - -  Injury with Fall? 0 - - - -  Follow up Education provided;Falls prevention discussed Falls evaluation completed - - -    Any stairs in or around the home? No  If so, are there any without handrails? No  Home free of loose throw rugs in walkways, pet beds, electrical cords, etc? Yes    Adequate lighting in your home to reduce risk of falls? Yes   ASSISTIVE DEVICES UTILIZED TO PREVENT FALLS:  Life alert? No  Use of a cane, walker or w/c? No  Grab bars in the bathroom? No  Shower chair or bench in shower? No  Elevated toilet seat or a handicapped toilet? No    Cognitive Function: Ad8 score reviewed for issues:  Issues making decisions:no  Less interest in hobbies / activities:no  Repeats questions, stories (family complaining):no  Trouble using ordinary gadgets (microwave, computer, phone):no  Forgets the month or year: no  Mismanaging finances: no  Remembering appts:no  Daily problems with thinking and/or memory:no Ad8 score is=0  MMSE - Mini Mental State Exam 10/15/2019  Orientation to time 4  Orientation to Place 5  Registration 3  Attention/ Calculation 0  Recall 0  Language- name 2 objects 2  Language- repeat 1  Language- follow 3 step command 3  Language- read & follow direction 1  Write a sentence 1  Copy design 1  Total score 21        Immunizations Immunization History  Administered Date(s) Administered  . Pneumococcal Conjugate-13 11/26/2015  . Pneumococcal Polysaccharide-23 09/20/2014  . Tetanus 01/10/2013  . Zoster 07/09/2010    TDAP status: Up to date Pneumococcal vaccine status: Up to date Covid-19 vaccine status: Completed vaccines  Qualifies for Shingles Vaccine?   Zostavax completed Yes     Screening Tests Health Maintenance  Topic Date Due  . COVID-19 Vaccine (1) Never done  . INFLUENZA VACCINE  02/10/2020  . TETANUS/TDAP  01/11/2023  . Hepatitis C Screening  Completed  . PNA vac Low Risk Adult  Completed    Health Maintenance  Health Maintenance Due  Topic Date Due  . COVID-19 Vaccine (1) Never done    Colorectal cancer screening: Completed 04/15/11. Repeat every 10 years  Lung Cancer Screening: (Low Dose CT Chest recommended if Age 85-80 years, 30 pack-year currently smoking OR have quit w/in  15years.) does not qualify.  Additional Screening:  Hepatitis C Screening:  Completed 08/25/16  Vision Screening: Recommended annual ophthalmology exams for early detection of glaucoma and other disorders of the eye. Is the patient up to date with their annual eye exam?  No  Who is the provider or what is the name of the office in which the patient attends annual eye exams? Dr.Digby   Dental Screening: Recommended annual dental exams for proper oral hygiene  Community Resource Referral / Chronic Care Management: CRR required this visit?  No   CCM required this visit?  No      Plan:     I have personally reviewed and noted the following in the patient's chart:   . Medical and social history . Use of alcohol, tobacco or illicit drugs  . Current medications and supplements . Functional ability and status . Nutritional status . Physical activity . Advanced directives . List of other physicians . Hospitalizations, surgeries, and ER visits in previous 12 months . Vitals . Screenings to include cognitive, depression, and falls . Referrals and appointments  In addition, I have reviewed and discussed with patient certain preventive protocols, quality metrics, and best practice recommendations. A written personalized care plan for preventive services as well as general preventive health recommendations were provided to patient.   Due to this being a telephonic visit, the after visit summary with patients personalized plan was offered to patient via mail or my-chart. Patient preferred to pick up at office at next visit.   Shela Nevin, [place]   01/04/2020   Nurse Notes: Lives w/ wife in 1 story home.

## 2020-01-04 ENCOUNTER — Ambulatory Visit (INDEPENDENT_AMBULATORY_CARE_PROVIDER_SITE_OTHER): Payer: Medicare Other | Admitting: *Deleted

## 2020-01-04 ENCOUNTER — Other Ambulatory Visit: Payer: Self-pay

## 2020-01-04 ENCOUNTER — Encounter: Payer: Self-pay | Admitting: *Deleted

## 2020-01-04 DIAGNOSIS — Z0000 Encounter for general adult medical examination without abnormal findings: Secondary | ICD-10-CM

## 2020-01-04 NOTE — Patient Instructions (Signed)
Nicholas Burgess , Thank you for taking time to come for your Medicare Wellness Visit. I appreciate your ongoing commitment to your health goals. Please review the following plan we discussed and let me know if I can assist you in the future.   Screening recommendations/referrals: Colorectal cancer screening: Completed 04/15/11. Repeat every 10 years Recommended yearly ophthalmology/optometry visit for glaucoma screening and checkup Recommended yearly dental visit for hygiene and checkup  Vaccinations: TDAP status: Up to date Pneumococcal vaccine status: Up to date Covid-19 vaccine status: Completed vaccines Zostavax completed Yes    Advanced directives: Bring a copy of your living will and/or healthcare power of attorney to your next office visit.   Next appointment: Follow up in one year for your annual wellness visit    Preventive Care 65 Years and Older, Male Preventive care refers to lifestyle choices and visits with your health care provider that can promote health and wellness. This includes:  A yearly physical exam. This is also called an annual well check.  Regular dental and eye exams.  Immunizations.  Screening for certain conditions.  Healthy lifestyle choices, such as diet and exercise. What can I expect for my preventive care visit? Physical exam Your health care provider will check:  Height and weight. These may be used to calculate body mass index (BMI), which is a measurement that tells if you are at a healthy weight.  Heart rate and blood pressure.  Your skin for abnormal spots. Counseling Your health care provider may ask you questions about:  Alcohol, tobacco, and drug use.  Emotional well-being.  Home and relationship well-being.  Sexual activity.  Eating habits.  History of falls.  Memory and ability to understand (cognition).  Work and work Statistician. What immunizations do I need?  Influenza (flu) vaccine  This is recommended every  year. Tetanus, diphtheria, and pertussis (Tdap) vaccine  You may need a Td booster every 10 years. Varicella (chickenpox) vaccine  You may need this vaccine if you have not already been vaccinated. Zoster (shingles) vaccine  You may need this after age 67. Pneumococcal conjugate (PCV13) vaccine  One dose is recommended after age 23. Pneumococcal polysaccharide (PPSV23) vaccine  One dose is recommended after age 68. Measles, mumps, and rubella (MMR) vaccine  You may need at least one dose of MMR if you were born in 1957 or later. You may also need a second dose. Meningococcal conjugate (MenACWY) vaccine  You may need this if you have certain conditions. Hepatitis A vaccine  You may need this if you have certain conditions or if you travel or work in places where you may be exposed to hepatitis A. Hepatitis B vaccine  You may need this if you have certain conditions or if you travel or work in places where you may be exposed to hepatitis B. Haemophilus influenzae type b (Hib) vaccine  You may need this if you have certain conditions. You may receive vaccines as individual doses or as more than one vaccine together in one shot (combination vaccines). Talk with your health care provider about the risks and benefits of combination vaccines. What tests do I need? Blood tests  Lipid and cholesterol levels. These may be checked every 5 years, or more frequently depending on your overall health.  Hepatitis C test.  Hepatitis B test. Screening  Lung cancer screening. You may have this screening every year starting at age 62 if you have a 30-pack-year history of smoking and currently smoke or have quit within the  past 15 years.  Colorectal cancer screening. All adults should have this screening starting at age 37 and continuing until age 49. Your health care provider may recommend screening at age 33 if you are at increased risk. You will have tests every 1-10 years, depending on  your results and the type of screening test.  Prostate cancer screening. Recommendations will vary depending on your family history and other risks.  Diabetes screening. This is done by checking your blood sugar (glucose) after you have not eaten for a while (fasting). You may have this done every 1-3 years.  Abdominal aortic aneurysm (AAA) screening. You may need this if you are a current or former smoker.  Sexually transmitted disease (STD) testing. Follow these instructions at home: Eating and drinking  Eat a diet that includes fresh fruits and vegetables, whole grains, lean protein, and low-fat dairy products. Limit your intake of foods with high amounts of sugar, saturated fats, and salt.  Take vitamin and mineral supplements as recommended by your health care provider.  Do not drink alcohol if your health care provider tells you not to drink.  If you drink alcohol: ? Limit how much you have to 0-2 drinks a day. ? Be aware of how much alcohol is in your drink. In the [place] one drink equals one 12 oz bottle of beer (355 mL), one 5 oz glass of wine (148 mL), or one 1 oz glass of hard liquor (44 mL). Lifestyle  Take daily care of your teeth and gums.  Stay active. Exercise for at least 30 minutes on 5 or more days each week.  Do not use any products that contain nicotine or tobacco, such as cigarettes, e-cigarettes, and chewing tobacco. If you need help quitting, ask your health care provider.  If you are sexually active, practice safe sex. Use a condom or other form of protection to prevent STIs (sexually transmitted infections).  Talk with your health care provider about taking a low-dose aspirin or statin. What's next?  Visit your health care provider once a year for a well check visit.  Ask your health care provider how often you should have your eyes and teeth checked.  Stay up to date on all vaccines. This information is not intended to replace advice given to you by  your health care provider. Make sure you discuss any questions you have with your health care provider. Document Revised: 06/22/2018 Document Reviewed: 06/22/2018 Elsevier Patient Education  2020 [employer].

## 2020-01-21 ENCOUNTER — Other Ambulatory Visit: Payer: Self-pay | Admitting: Internal Medicine

## 2020-01-22 ENCOUNTER — Other Ambulatory Visit: Payer: Self-pay

## 2020-01-22 ENCOUNTER — Ambulatory Visit (INDEPENDENT_AMBULATORY_CARE_PROVIDER_SITE_OTHER): Payer: Medicare Other | Admitting: Internal Medicine

## 2020-01-22 ENCOUNTER — Encounter: Payer: Self-pay | Admitting: Internal Medicine

## 2020-01-22 VITALS — BP 110/75 | HR 66 | Temp 98.2°F | Resp 18 | Ht 74.0 in | Wt 258.5 lb

## 2020-01-22 DIAGNOSIS — G3184 Mild cognitive impairment, so stated: Secondary | ICD-10-CM | POA: Diagnosis not present

## 2020-01-22 MED ORDER — DONEPEZIL HCL 10 MG PO TABS
10.0000 mg | ORAL_TABLET | Freq: Every day | ORAL | 1 refills | Status: DC
Start: 2020-01-22 — End: 2020-08-12

## 2020-01-22 NOTE — Patient Instructions (Signed)
Increase Aricept to 10 mg, 1 tablet daily   GO TO THE FRONT DESK, PLEASE SCHEDULE YOUR APPOINTMENTS Come back for   a physical exam in 4 months  ==============================================  CARING FOR THE MEMORY IMPAIRED   The 36-Hour Day a book by Eda Keys and Collier Salina Rabins is a detailed self-help guide for people caring for loved ones with Alzheimer's disease, dementia, and other memory impairments.  Web Resources : [employer]  http://rojas.com/   Alzheimer's Disease Alzheimer's disease is a brain disease that affects memory, thinking, language, and behavior. People with Alzheimer's disease lose mental abilities, and the disease gets worse over time. Alzheimer's disease is a form of dementia. What are the causes? This condition develops when a protein called beta-amyloid forms deposits in the brain. It is not known what causes these deposits to form. Alzheimer's disease may also be caused by a gene mutation that is inherited from one parent or both parents. A gene mutation is a harmful change in a gene. Not everyone who inherits the genetic mutation will get the disease. What increases the risk? You are more likely to develop this condition if you:  Are older than age 56.  Have a family history of dementia.  Have had a brain injury.  Have heart or blood vessel disease.  Have had a stroke.  Have high blood pressure or high cholesterol.  Have diabetes. What are the signs or symptoms? Symptoms of this condition may happen in three stages, which often overlap. Early stage In this stage, you may continue to be independent. You may still be able to drive, work, and be social. Symptoms in this stage include:  Minor memory problems, such as forgetting a name or what you read.  Difficulty with: ? Paying attention. ? Communicating. ? Doing familiar tasks. ? Problem solving or doing calculations. ? Following instructions. ? Learning new things.  Anxiety.  Social  withdrawal.  Loss of motivation. Moderate stage In this stage, you will start to need care. Symptoms in this stage include:  Difficulty with expressing thoughts.  Memory loss that affects daily life. This can include forgetting: ? Your address or phone number. ? Recent events that have happened. ? Parts of your personal history, such as where you went to school.  Confusion about where you are or what time it is.  Difficulty in judging distance.  Changes in personality, mood, and behavior. You may be moody, irritable, angry, frustrated, fearful, anxious, or suspicious.  Poor reasoning and judgment.  Delusions or hallucinations.  Changes in sleep patterns.  Wandering and getting lost, even in familiar places. Severe stage In the final stage, you will need help with your personal care and daily activities. Symptoms in this stage include:  Worsening memory loss.  Personality changes.  Loss of awareness of your surroundings.  Changes in physical abilities, including the ability to walk, sit, and swallow.  Difficulty in communicating.  Inability to control your bladder and bowels.  Increasing confusion.  Increasing behavior changes. How is this diagnosed? This condition is diagnosed by a health care provider who specializes in diseases of the nervous system (neurologist). Other causes of dementia may also be ruled out. Your health care provider will talk with you and your family, friends, or caregivers about your history and symptoms. A thorough medical history will be taken, and you will have a physical exam and tests. Tests may include:  Lab tests, such as blood or urine tests.  Imaging tests, such as a CT scan,  a PET scan, or an MRI.  A lumbar puncture. This test involves removing and testing a small amount of the fluid that surrounds the brain and spinal cord.  An electroencephalogram (EEG). In this test, small metal discs are used to measure electrical activity in  the brain.  Memory tests, cognitive tests, and neuropsychological tests. These tests evaluate brain function.  Genetic testing may be done if you have early onset of the disease (before age 58) or if other family members have the disease. How is this treated? At this time, there is no treatment to cure Alzheimer's disease or stop it from getting worse. The goals of treatment are:  To slow down symptoms of the disease, if possible.  To manage behavioral changes.  To provide you with a safe environment.  To help manage daily life for you and your caregivers. The following treatment options are available:  Medicines. Medicines may help to slow down memory loss and manage behavioral symptoms.  Cognitive therapy. Cognitive therapy provides you with education, support, and memory aids. It is most helpful in the early stages of the condition.  Counseling or spiritual guidance. It is normal to have a lot of feelings, including anger, relief, fear, and isolation. Counseling and guidance can help you deal with these feelings.  Caregiving. This involves having caregivers help you with your daily activities.  Family support groups. These provide education, emotional support, and information about community resources to family members who are taking care of you. Follow these instructions at home:  Medicines  Take over-the-counter and prescription medicines only as told by your health care provider.  Use a pill organizer or pill reminder to help you manage your medicines.  Avoid taking medicines that can affect thinking, such as pain medicines or sleeping medicines. Lifestyle  Make healthy lifestyle choices: ? Be physically active as told by your health care provider. Regular exercise may help improve symptoms. ? Do not use any products that contain nicotine or tobacco, such as cigarettes, e-cigarettes, and chewing tobacco. If you need help quitting, ask your health care provider. ? Do not  drink alcohol. ? Eat a healthy diet. ? Practice stress-management techniques when you get stressed. ? Stay social.  Drink enough fluid to keep your urine pale yellow.  Make sure to get quality sleep. ? Avoid taking long naps during the day. Take short naps of 30 minutes or less if needed. ? Keep your sleeping area dark and cool. ? Avoid exercising during the few hours before you go to bed. ? Avoid caffeine products in the afternoon and evening. General instructions  Work with your health care provider to determine what you need help with and what your safety needs are.  If you were given a bracelet that identifies you as a person with memory loss or tracks your location, make sure to wear it at all times.  Talk with your health care provider about whether it is safe for you to drive.  Work with your family to make important decisions, such as advance directives, medical power of attorney, or a living will.  Keep all follow-up visits as told by your health care provider. This is important. Where to find more information  The [employer]: Call the 24-hour helpline at 1-360 022 6633, or visit [employer] Contact a health care provider if:  You have nausea, vomiting, or trouble with eating.  You have dizziness or weakness.  You or your family members become concerned for your safety. Get help right away if:  You feel depressed or sad, or feel that you want to harm yourself.  You develop chest pain or difficulty with breathing.  You pass out. If you ever feel like you may hurt yourself or others, or have thoughts about taking your own life, get help right away. You can go to your nearest emergency department or call:  Your local emergency services (911 in the [place]  A suicide crisis helpline, such as the [place] at (317)231-4006. This is open 24 hours a day. Summary  Alzheimer's disease is a brain disease that affects memory,  thinking, language, and behavior. Alzheimer's disease is a form of dementia.  This condition is diagnosed by a specialist in diseases of the nervous system (neurologist).  At this time, there is no treatment to cure Alzheimer's disease or stop it from getting worse. The goals of treatment are to slow memory loss and help you manage any symptoms.  Work with your family to make important decisions, such as advance directives, medical power of attorney, or a living will. This information is not intended to replace advice given to you by your health care provider. Make sure you discuss any questions you have with your health care provider. Document Revised: 06/06/2018 Document Reviewed: 06/06/2018 Elsevier Patient Education  2020 [employer].

## 2020-01-22 NOTE — Progress Notes (Signed)
Subjective:    Patient ID: Nicholas Burgess, male    DOB: 03-Nov-1940, 79 y.o.   MRN: [id number]  DOS:  01/22/2020 Type of visit - description: Follow-up, here with his son Since the last office visit he started Aricept, no apparent side effects. His son reports good compliance and he seems to be at baseline. From time to time still gets somewhat confused.  Wt Readings from Last 3 Encounters:  01/22/20 258 lb 8 oz (117.3 kg)  10/15/19 261 lb 2 oz (118.4 kg)  06/12/19 260 lb 6 oz (118.1 kg)     Review of Systems Patient denies any problems, feels well, takes care of his wife who is also doing well. Denies depression  Past Medical History:  Diagnosis Date  . Diverticulosis of colon   . Erectile dysfunction due to arterial insufficiency   . Hematospermia   . Hx of colonic polyps 2007  . Hyperlipidemia   . Hypertension   . Syncope 2002    Past Surgical History:  Procedure Laterality Date  . APPENDECTOMY  1992  . CATARACT EXTRACTION Bilateral 06/13/2017  . COLONOSCOPY W/ POLYPECTOMY  2007 & 2012   Dr.Perry ; hyperplastic  . INGUINAL HERNIA REPAIR  2009   bilateral  . MANDIBLE FRACTURE SURGERY  1964    Allergies as of 01/22/2020   No Known Allergies     Medication List       Accurate as of January 22, 2020 11:59 PM. If you have any questions, ask your nurse or doctor.        acetaminophen 500 MG tablet Commonly known as: TYLENOL Take 500 mg by mouth every 6 (six) hours as needed for pain. For headache pain   aspirin 81 MG tablet Take 81 mg by mouth daily.   atorvastatin 20 MG tablet Commonly known as: LIPITOR Take 1 tablet (20 mg total) by mouth daily.   donepezil 10 MG tablet Commonly known as: Aricept Take 1 tablet (10 mg total) by mouth at bedtime. What changed:   medication strength  how much to take Changed by: Kathlene November, MD   Fish Oil 1000 MG Caps Take 1,000 mg by mouth daily.   hydrochlorothiazide 25 MG tablet Commonly known as:  HYDRODIURIL Take 1 tablet (25 mg total) by mouth daily.   losartan 100 MG tablet Commonly known as: COZAAR Take 1 tablet (100 mg total) by mouth daily.   metoprolol succinate 100 MG 24 hr tablet Commonly known as: TOPROL-XL TAKE 1 AND 1/2 TABLETS BY MOUTH ONCE DAILY   multivitamin tablet Take 1 tablet by mouth daily.   Prolensa 0.07 % Soln Generic drug: Bromfenac Sodium Take as directed   spironolactone 25 MG tablet Commonly known as: ALDACTONE Take 1 tablet (25 mg total) by mouth daily.          Objective:   Physical Exam BP 110/75 (BP Location: Left Arm, Patient Position: Sitting, Cuff Size: Normal)   Pulse 66   Temp 98.2 F (36.8 C) (Oral)   Resp 18   Ht 6\' 2"  (1.88 m)   Wt 258 lb 8 oz (117.3 kg)   SpO2 96%   BMI 33.19 kg/m  General:   Well developed, NAD, BMI noted. HEENT:  Normocephalic . Face symmetric, atraumatic  Neurologic:  alert & oriented X3.  Speech normal, gait appropriate for age and unassisted Psych--  Behavior appropriate. No anxious or depressed appearing.      Assessment     Assessment  Prediabetes HTN dc  amlodipine 05-2015 dt edema Leg edema, worse on the left, neg [place] for  DVT 01/2018 Hyperlipidemia Hematospermia 1 ( 12-2014) -- saw urology 09-2015, RTC prn Syncope 2002 Skin cancer   PLAN MCI:  Since the last visit, G83, folic acid and RPR were normal or negative. On Aricept 5 mg, doing well, does have confusion from time to time. He is relatively active, lives at home with his wife, takes care of her. He pays his own bills, goes shopping without any problems,  drives his car. MCI dx d/w pt and son >>>  what to expect from treatment, the role of staying physically and mentally active. At this point I do not see the need to do a brain MRI or refer him to neurology since he seems to have classic MCI symptoms.    Plan: Increase Aricept to 10 mg daily.  Reassess in few months. RTC 4 months CPX  RTC 3 months  This visit occurred  during the SARS-CoV-2 public health emergency.  Safety protocols were in place, including screening questions prior to the visit, additional usage of staff PPE, and extensive cleaning of exam room while observing appropriate contact time as indicated for disinfecting solutions.

## 2020-01-24 NOTE — Assessment & Plan Note (Signed)
MCI:  Since the last visit, O35, folic acid and RPR were normal or negative. On Aricept 5 mg, doing well, does have confusion from time to time. He is relatively active, lives at home with his wife, takes care of her. He pays his own bills, goes shopping without any problems,  drives his car. MCI dx d/w pt and son >>>  what to expect from treatment, the role of staying physically and mentally active. At this point I do not see the need to do a brain MRI or refer him to neurology since he seems to have classic MCI symptoms.    Plan: Increase Aricept to 10 mg daily.  Reassess in few months. RTC 4 months CPX  RTC 3 months

## 2020-04-21 ENCOUNTER — Other Ambulatory Visit: Payer: Self-pay | Admitting: Internal Medicine

## 2020-04-24 ENCOUNTER — Telehealth: Payer: Self-pay | Admitting: Internal Medicine

## 2020-04-24 NOTE — Progress Notes (Signed)
  Chronic Care Management   Outreach Note  04/24/2020 Name: CEM KOSMAN MRN: [id number] DOB: [birth year]  Referred by: Colon Branch, MD Reason for referral : No chief complaint on file.   An unsuccessful telephone outreach was attempted today. The patient was referred to the pharmacist for assistance with care management and care coordination.   Follow Up Plan:   Carley Perdue [employer] Scheduler

## 2020-06-06 ENCOUNTER — Other Ambulatory Visit: Payer: Self-pay | Admitting: Internal Medicine

## 2020-06-07 ENCOUNTER — Other Ambulatory Visit: Payer: Self-pay | Admitting: Internal Medicine

## 2020-06-11 ENCOUNTER — Other Ambulatory Visit: Payer: Self-pay | Admitting: Internal Medicine

## 2020-06-19 ENCOUNTER — Encounter: Payer: Self-pay | Admitting: Internal Medicine

## 2020-06-19 ENCOUNTER — Other Ambulatory Visit: Payer: Self-pay

## 2020-06-19 ENCOUNTER — Ambulatory Visit (INDEPENDENT_AMBULATORY_CARE_PROVIDER_SITE_OTHER): Payer: Medicare Other | Admitting: Internal Medicine

## 2020-06-19 VITALS — BP 109/71 | HR 63 | Temp 97.8°F | Ht 74.0 in | Wt 260.0 lb

## 2020-06-19 DIAGNOSIS — R739 Hyperglycemia, unspecified: Secondary | ICD-10-CM | POA: Diagnosis not present

## 2020-06-19 DIAGNOSIS — Z0000 Encounter for general adult medical examination without abnormal findings: Secondary | ICD-10-CM

## 2020-06-19 DIAGNOSIS — G3184 Mild cognitive impairment, so stated: Secondary | ICD-10-CM

## 2020-06-19 DIAGNOSIS — L821 Other seborrheic keratosis: Secondary | ICD-10-CM

## 2020-06-19 DIAGNOSIS — E785 Hyperlipidemia, unspecified: Secondary | ICD-10-CM

## 2020-06-19 DIAGNOSIS — I10 Essential (primary) hypertension: Secondary | ICD-10-CM

## 2020-06-19 LAB — COMPREHENSIVE METABOLIC PANEL
ALT: 22 U/L (ref 0–53)
AST: 17 U/L (ref 0–37)
Albumin: 4.2 g/dL (ref 3.5–5.2)
Alkaline Phosphatase: 81 U/L (ref 39–117)
BUN: 22 mg/dL (ref 6–23)
CO2: 31 mEq/L (ref 19–32)
Calcium: 9.9 mg/dL (ref 8.4–10.5)
Chloride: 100 mEq/L (ref 96–112)
Creatinine, Ser: 1.17 mg/dL (ref 0.40–1.50)
GFR: 59.89 mL/min — ABNORMAL LOW (ref >60.00)
Glucose, Bld: 104 mg/dL — ABNORMAL HIGH (ref 70–99)
Potassium: 4 mEq/L (ref 3.5–5.1)
Sodium: 140 mEq/L (ref 135–145)
Total Bilirubin: 0.6 mg/dL (ref 0.2–1.2)
Total Protein: 7.1 g/dL (ref 6.0–8.3)

## 2020-06-19 LAB — LIPID PANEL
Cholesterol: 193 mg/dL (ref 0–200)
HDL: 36.1 mg/dL — ABNORMAL LOW (ref >39.00)
NonHDL: 157.24
Total CHOL/HDL Ratio: 5
Triglycerides: 298 mg/dL — ABNORMAL HIGH (ref 0.0–149.0)
VLDL: 59.6 mg/dL — ABNORMAL HIGH (ref 0.0–40.0)

## 2020-06-19 LAB — HEMOGLOBIN A1C: Hgb A1c MFr Bld: 6.2 % (ref 4.6–6.5)

## 2020-06-19 LAB — LDL CHOLESTEROL, DIRECT: Direct LDL: 112 mg/dL

## 2020-06-19 LAB — TSH: TSH: 3.73 u[IU]/mL (ref 0.35–4.50)

## 2020-06-19 NOTE — Progress Notes (Signed)
Subjective:    Patient ID: Nicholas Burgess, male    DOB: 15-Feb-1941, 79 y.o.   MRN: [id number]  DOS:  06/19/2020 Type of visit - description: Here with his son for a CPX  Multiple all issues discussed.  Review of Systems Memory is about the same.  No behavioral issues. Has a skin lesion, left ear. No recent falls.  Other than above, a 14 point review of systems is negative    Past Medical History:  Diagnosis Date  . Diverticulosis of colon   . Erectile dysfunction due to arterial insufficiency   . Hematospermia   . Hx of colonic polyps 2007  . Hyperlipidemia   . Hypertension   . Syncope 2002    Past Surgical History:  Procedure Laterality Date  . APPENDECTOMY  1992  . CATARACT EXTRACTION Bilateral 06/13/2017  . COLONOSCOPY W/ POLYPECTOMY  2007 & 2012   Dr.Perry ; hyperplastic  . INGUINAL HERNIA REPAIR  2009   bilateral  . MANDIBLE FRACTURE SURGERY  1964    Allergies as of 06/19/2020   No Known Allergies     Medication List       Accurate as of June 19, 2020 11:59 PM. If you have any questions, ask your nurse or doctor.        acetaminophen 500 MG tablet Commonly known as: TYLENOL Take 500 mg by mouth every 6 (six) hours as needed for pain. For headache pain   aspirin 81 MG tablet Take 81 mg by mouth daily.   atorvastatin 20 MG tablet Commonly known as: LIPITOR Take 1 tablet (20 mg total) by mouth daily.   donepezil 10 MG tablet Commonly known as: Aricept Take 1 tablet (10 mg total) by mouth at bedtime.   Fish Oil 1000 MG Caps Take 1,000 mg by mouth daily.   hydrochlorothiazide 25 MG tablet Commonly known as: HYDRODIURIL TAKE 1 TABLET (25 MG TOTAL) BY MOUTH DAILY.   losartan 100 MG tablet Commonly known as: COZAAR Take 1 tablet (100 mg total) by mouth daily.   metoprolol succinate 100 MG 24 hr tablet Commonly known as: TOPROL-XL TAKE 1 AND 1/2 TABLETS BY MOUTH ONCE DAILY   multivitamin tablet Take 1 tablet by mouth daily.   Prolensa  0.07 % Soln Generic drug: Bromfenac Sodium Take as directed   spironolactone 25 MG tablet Commonly known as: ALDACTONE TAKE 1 TABLET BY MOUTH DAILY.          Objective:   Physical Exam BP 109/71 (BP Location: Right Arm, Patient Position: Sitting, Cuff Size: Large)   Pulse 63   Temp 97.8 F (36.6 C) (Oral)   Ht 6\' 2"  (1.88 m)   Wt 260 lb (117.9 kg)   SpO2 93%   BMI 33.38 kg/m  General: Well developed, NAD, BMI noted  HEENT:  Normocephalic . Face symmetric, atraumatic Lungs:  CTA B Normal respiratory effort, no intercostal retractions, no accessory muscle use. Heart: RRR,  no murmur.  Abdomen:  Not distended, soft, non-tender. No rebound or rigidity.   Lower extremities: no pretibial edema bilaterally  Skin: Left ear, has a lesion consistent with SK, approximately 10 x 8 mm. Neurologic:  alert & oriented X3.  Speech normal, gait appropriate for age and unassisted Strength symmetric and appropriate for age.  Psych: Cooperative with normal attention span and concentration.  Behavior appropriate. No anxious or depressed appearing.     Assessment     Assessment  Prediabetes HTN dc amlodipine 05-2015 dt edema Leg edema,  worse on the left, neg [place] for  DVT 01/2018 Hyperlipidemia Hematospermia 1 ( 12-2014) -- saw urology 09-2015, RTC prn Syncope 2002 Skin cancer  MCI MMSE 21 (10-2019), rx aricept    PLAN Here for CPX Prediabetes: Checking A1c HTN: On losartan, Aldactone, metoprolol, HCTZ.  BP slightly low today, no symptoms, recommend ambulatory BPs, checking a CMP Hyperlipidemia: On Lipitor, checking labs MCI: On Aricept, no decrease in memory since the last visit.  Still drives, pays his bills and go shopping. No recent brain imaging, that was discussed with the family, we the patient and his son, eventually agreed not to pursue MRI unless there is any drastic change. SK: See physical exam, rec observation and in general to monitor any skin lesions, call if  change in color, increase size of bleeding. RTC 6 months  In addition to CPX, we discussed unassessable his chronic medical problems including prediabetes, HTN, high cholesterol, MCI on a new a skin lesion consistent with SK. This visit occurred during the SARS-CoV-2 public health emergency.  Safety protocols were in place, including screening questions prior to the visit, additional usage of staff PPE, and extensive cleaning of exam room while observing appropriate contact time as indicated for disinfecting solutions.

## 2020-06-19 NOTE — Patient Instructions (Signed)
Check the  blood pressure twice a month. BP GOAL is between 110/65 and  135/85. If it is consistently higher or lower, let me know   Seborrheic keratoses: Last name of the lesion of the left ear. Learn more about it at the [facility] website Check all your skin lesions and let me know if they are growing, changing in color.   GO TO THE LAB : Get the blood work     [place], [place] Come back for a checkup in 6 months   Advance Directive  Advance directives are legal documents that let you make choices ahead of time about your health care and medical treatment in case you become unable to communicate for yourself. Advance directives are a way for you to make known your wishes to family, friends, and health care providers. This can let others know about your end-of-life care if you become unable to communicate. Discussing and writing advance directives should happen over time rather than all at once. Advance directives can be changed depending on your situation and what you want, even after you have signed the advance directives. There are different types of advance directives, such as:  Medical power of attorney.  Living will.  Do not resuscitate (DNR) or do not attempt resuscitation (DNAR) order. Health care proxy and medical power of attorney A health care proxy is also called a health care agent. This is a person who is appointed to make medical decisions for you in cases where you are unable to make the decisions yourself. Generally, people choose someone they know well and trust to represent their preferences. Make sure to ask this person for an agreement to act as your proxy. A proxy may have to exercise judgment in the event of a medical decision for which your wishes are not known. A medical power of attorney is a legal document that names your health care proxy. Depending on the laws in your state, after the document is written, it may also  need to be:  Signed.  Notarized.  Dated.  Copied.  Witnessed.  Incorporated into your medical record. You may also want to appoint someone to manage your money in a situation in which you are unable to do so. This is called a durable power of attorney for finances. It is a separate legal document from the durable power of attorney for health care. You may choose the same person or someone different from your health care proxy to act as your agent in money matters. If you do not appoint a proxy, or if there is a concern that the proxy is not acting in your best interests, a court may appoint a guardian to act on your behalf. Living will A living will is a set of instructions that state your wishes about medical care when you cannot express them yourself. Health care providers should keep a copy of your living will in your medical record. You may want to give a copy to family members or friends. To alert caregivers in case of an emergency, you can place a card in your wallet to let them know that you have a living will and where they can find it. A living will is used if you become:  Terminally ill.  Disabled.  Unable to communicate or make decisions. Items to consider in your living will include:  To use or not to use life-support equipment, such as dialysis machines and breathing machines (ventilators).  A DNR or DNAR order. This tells health care providers not to use cardiopulmonary resuscitation (CPR) if breathing or heartbeat stops.  To use or not to use tube feeding.  To be given or not to be given food and fluids.  Comfort (palliative) care when the goal becomes comfort rather than a cure.  Donation of organs and tissues. A living will does not give instructions for distributing your money and property if you should pass away. DNR or DNAR A DNR or DNAR order is a request not to have CPR in the event that your heart stops beating or you stop breathing. If a DNR or DNAR order  has not been made and shared, a health care provider will try to help any patient whose heart has stopped or who has stopped breathing. If you plan to have surgery, talk with your health care provider about how your DNR or DNAR order will be followed if problems occur. What if I do not have an advance directive? If you do not have an advance directive, some states assign family decision makers to act on your behalf based on how closely you are related to them. Each state has its own laws about advance directives. You may want to check with your health care provider, attorney, or state representative about the laws in your state. Summary  Advance directives are the legal documents that allow you to make choices ahead of time about your health care and medical treatment in case you become unable to tell others about your care.  The process of discussing and writing advance directives should happen over time. You can change the advance directives, even after you have signed them.  Advance directives include DNR or DNAR orders, living wills, and designating an agent as your medical power of attorney. This information is not intended to replace advice given to you by your health care provider. Make sure you discuss any questions you have with your health care provider. Document Revised: 01/25/2019 Document Reviewed: 01/25/2019 Elsevier Patient Education  [place] in the Home, Adult Falls can cause injuries and can affect people from all age groups. There are many simple things that you can do to make your home safe and to help prevent falls. Ask for help when making these changes, if needed. What actions can I take to prevent falls? General instructions  Use good lighting in all rooms. Replace any light bulbs that burn out.  Turn on lights if it is dark. Use night-lights.  Place frequently used items in easy-to-reach places. Lower the shelves around your home if  necessary.  Set up furniture so that there are clear paths around it. Avoid moving your furniture around.  Remove throw rugs and other tripping hazards from the floor.  Avoid walking on wet floors.  Fix any uneven floor surfaces.  Add color or contrast paint or tape to grab bars and handrails in your home. Place contrasting color strips on the first and last steps of stairways.  When you use a stepladder, make sure that it is completely opened and that the sides are firmly locked. Have someone hold the ladder while you are using it. Do not climb a closed stepladder.  Be aware of any and all pets. What can I do in the bathroom?      Keep the floor dry. Immediately clean up any water that spills onto the floor.  Remove soap buildup in the tub or shower on  a regular basis.  Use non-skid mats or decals on the floor of the tub or shower.  Attach bath mats securely with double-sided, non-slip rug tape.  If you need to sit down while you are in the shower, use a plastic, non-slip stool.  Install grab bars by the toilet and in the tub and shower. Do not use towel bars as grab bars. What can I do in the bedroom?  Make sure that a bedside light is easy to reach.  Do not use oversized bedding that drapes onto the floor.  Have a firm chair that has side arms to use for getting dressed. What can I do in the kitchen?  Clean up any spills right away.  If you need to reach for something above you, use a sturdy step stool that has a grab bar.  Keep electrical cables out of the way.  Do not use floor polish or wax that makes floors slippery. If you must use wax, make sure that it is non-skid floor wax. What can I do in the stairways?  Do not leave any items on the stairs.  Make sure that you have a light switch at the top of the stairs and the bottom of the stairs. Have them installed if you do not have them.  Make sure that there are handrails on both sides of the stairs. Fix  handrails that are broken or loose. Make sure that handrails are as long as the stairways.  Install non-slip stair treads on all stairs in your home.  Avoid having throw rugs at the top or bottom of stairways, or secure the rugs with carpet tape to prevent them from moving.  Choose a carpet design that does not hide the edge of steps on the stairway.  Check any carpeting to make sure that it is firmly attached to the stairs. Fix any carpet that is loose or worn. What can I do on the outside of my home?  Use bright outdoor lighting.  Regularly repair the edges of walkways and driveways and fix any cracks.  Remove high doorway thresholds.  Trim any shrubbery on the main path into your home.  Regularly check that handrails are securely fastened and in good repair. Both sides of any steps should have handrails.  Install guardrails along the edges of any raised decks or porches.  Clear walkways of debris and clutter, including tools and rocks.  Have leaves, snow, and ice cleared regularly.  Use sand or salt on walkways during winter months.  In the garage, clean up any spills right away, including grease or oil spills. What other actions can I take?  Wear closed-toe shoes that fit well and support your feet. Wear shoes that have rubber soles or low heels.  Use mobility aids as needed, such as canes, walkers, scooters, and crutches.  Review your medicines with your health care provider. Some medicines can cause dizziness or changes in blood pressure, which increase your risk of falling. Talk with your health care provider about other ways that you can decrease your risk of falls. This may include working with a physical therapist or trainer to improve your strength, balance, and endurance. Where to find more information  Centers for Disease Control and Prevention, [employer]: WebmailGuide.co.za  [employer] on Aging: BrainJudge.co.uk Contact a health care provider  if:  You are afraid of falling at home.  You feel weak, drowsy, or dizzy at home.  You fall at home. Summary  There are many simple  things that you can do to make your home safe and to help prevent falls.  Ways to make your home safe include removing tripping hazards and installing grab bars in the bathroom.  Ask for help when making these changes in your home. This information is not intended to replace advice given to you by your health care provider. Make sure you discuss any questions you have with your health care provider. Document Revised: 06/10/2017 Document Reviewed: 02/10/2017 Elsevier Patient Education  2020 [employer].

## 2020-06-20 ENCOUNTER — Encounter: Payer: Self-pay | Admitting: Internal Medicine

## 2020-06-20 NOTE — Assessment & Plan Note (Signed)
-  Td 2014 - PNA 2016; Prevnar 11-2015 - Shingles 06/2010 - shingrex: d/w pt before - covid vax x 2 , rec booster  - flu shot discussed.  Declined. -CCS--04/2011--Dr Perry--neg  -No further prostate cancer screening   -Patient education: Fall prevention, advance care planning. -Labs:  CMP, FLP, A1c, TSH

## 2020-06-20 NOTE — Assessment & Plan Note (Signed)
Here for CPX Prediabetes: Checking A1c HTN: On losartan, Aldactone, metoprolol, HCTZ.  BP slightly low today, no symptoms, recommend ambulatory BPs, checking a CMP Hyperlipidemia: On Lipitor, checking labs MCI: On Aricept, no decrease in memory since the last visit.  Still drives, pays his bills and go shopping. No recent brain imaging, that was discussed with the family, we the patient and his son, eventually agreed not to pursue MRI unless there is any drastic change. SK: See physical exam, rec observation and in general to monitor any skin lesions, call if change in color, increase size of bleeding. RTC 6 months

## 2020-06-23 MED ORDER — ATORVASTATIN CALCIUM 40 MG PO TABS: 40.0000 mg | ORAL_TABLET | Freq: Every day | ORAL | 1 refills | Status: DC

## 2020-06-23 NOTE — Addendum Note (Signed)
Addended byDamita Dunnings D on: 06/23/2020 03:06 PM   Modules accepted: Orders

## 2020-07-22 ENCOUNTER — Other Ambulatory Visit: Payer: Self-pay | Admitting: Internal Medicine

## 2020-08-12 ENCOUNTER — Other Ambulatory Visit: Payer: Self-pay | Admitting: Internal Medicine

## 2020-09-08 ENCOUNTER — Other Ambulatory Visit: Payer: Self-pay | Admitting: Internal Medicine

## 2020-10-20 ENCOUNTER — Other Ambulatory Visit: Payer: Self-pay | Admitting: Internal Medicine

## 2020-12-18 ENCOUNTER — Ambulatory Visit: Payer: Medicare Other | Admitting: Internal Medicine

## 2020-12-18 ENCOUNTER — Encounter: Payer: Self-pay | Admitting: Internal Medicine

## 2020-12-23 ENCOUNTER — Other Ambulatory Visit: Payer: Self-pay | Admitting: Internal Medicine

## 2020-12-30 ENCOUNTER — Other Ambulatory Visit: Payer: Self-pay | Admitting: Internal Medicine

## 2021-01-07 ENCOUNTER — Telehealth: Payer: Self-pay | Admitting: Internal Medicine

## 2021-01-07 NOTE — Telephone Encounter (Signed)
Copied from [place] [phone]. Topic: Medicare AWV >> Jan 07, 2021  9:56 AM Harris-Coley, Hannah Beat wrote: Reason for CRM: Left message for patient to schedule Annual Wellness Visit.  Please schedule with Health Nurse Advisor Augustine Radar. at [facility].

## 2021-01-19 ENCOUNTER — Telehealth: Payer: Self-pay

## 2021-01-19 ENCOUNTER — Other Ambulatory Visit: Payer: Self-pay | Admitting: Internal Medicine

## 2021-01-19 NOTE — Telephone Encounter (Signed)
Called pt to get set up for 6 months f/u visit.

## 2021-01-21 ENCOUNTER — Ambulatory Visit: Payer: Medicare Other | Admitting: Internal Medicine

## 2021-01-21 ENCOUNTER — Encounter: Payer: Self-pay | Admitting: Internal Medicine

## 2021-01-26 ENCOUNTER — Other Ambulatory Visit: Payer: Self-pay | Admitting: Internal Medicine

## 2021-03-03 ENCOUNTER — Other Ambulatory Visit: Payer: Self-pay | Admitting: Internal Medicine

## 2021-04-20 ENCOUNTER — Other Ambulatory Visit: Payer: Self-pay | Admitting: Internal Medicine

## 2021-04-24 ENCOUNTER — Other Ambulatory Visit: Payer: Self-pay | Admitting: Internal Medicine

## 2021-05-18 ENCOUNTER — Other Ambulatory Visit: Payer: Self-pay | Admitting: Internal Medicine

## 2021-06-01 ENCOUNTER — Ambulatory Visit (INDEPENDENT_AMBULATORY_CARE_PROVIDER_SITE_OTHER): Payer: Medicare Other

## 2021-06-01 VITALS — Ht 74.0 in | Wt 260.0 lb

## 2021-06-01 DIAGNOSIS — Z0000 Encounter for general adult medical examination without abnormal findings: Secondary | ICD-10-CM

## 2021-06-01 NOTE — Progress Notes (Addendum)
Subjective:   Nicholas Burgess is a 80 y.o. male who presents for Medicare Annual/Subsequent preventive examination.  I connected with Galileo today by telephone and verified that I am speaking with the correct person using two identifiers. Location patient: home Location provider: work Persons participating in the virtual visit: patient, Marine scientist.    I discussed the limitations, risks, security and privacy concerns of performing an evaluation and management service by telephone and the availability of in person appointments. I also discussed with the patient that there may be a patient responsible charge related to this service. The patient expressed understanding and verbally consented to this telephonic visit.    Interactive audio and video telecommunications were attempted between this provider and patient, however failed, due to patient having technical difficulties OR patient did not have access to video capability.  We continued and completed visit with audio only.  Some vital signs may be absent or patient reported.   Time Spent with patient on telephone encounter: 20 minutes   Review of Systems     Cardiac Risk Factors include: advanced age (>53men, >17 women);hypertension;male gender;dyslipidemia;obesity (BMI >30kg/m2);sedentary lifestyle     Objective:    Today's Vitals   06/01/21 1458  Weight: 260 lb (117.9 kg)  Height: 6\' 2"  (1.88 m)   Body mass index is 33.38 kg/m.  Advanced Directives 06/01/2021 01/04/2020 01/04/2019 02/09/2017  Does Patient Have a Medical Advance Directive? Yes Yes Yes No  Type of Paramedic of [place];Living will Living will -  Does patient want to make changes to medical advance directive? - No - Patient declined No - Patient declined -  Copy of [place] in Chart? No - copy requested No - copy requested - -  Would patient like information on creating a medical advance directive?  - - - No - Patient declined    Current Medications (verified) Outpatient Encounter Medications as of 06/01/2021  Medication Sig   acetaminophen (TYLENOL) 500 MG tablet Take 500 mg by mouth every 6 (six) hours as needed for pain. For headache pain   aspirin 81 MG tablet Take 81 mg by mouth daily.   atorvastatin (LIPITOR) 40 MG tablet Take 1 tablet (40 mg total) by mouth at bedtime.   donepezil (ARICEPT) 10 MG tablet TAKE 1 TABLET BY MOUTH AT BEDTIME.   hydrochlorothiazide (HYDRODIURIL) 25 MG tablet TAKE 1 TABLET (25 MG TOTAL) BY MOUTH DAILY.   losartan (COZAAR) 100 MG tablet TAKE 1 TABLET (100 MG TOTAL) BY MOUTH DAILY.   metoprolol succinate (TOPROL-XL) 100 MG 24 hr tablet Take 1.5 tablets (150 mg total) by mouth daily.   Multiple Vitamin (MULTIVITAMIN) tablet Take 1 tablet by mouth daily.   Omega-3 Fatty Acids (FISH OIL) 1000 MG CAPS Take 1,000 mg by mouth daily.   PROLENSA 0.07 % SOLN Take as directed   spironolactone (ALDACTONE) 25 MG tablet Take 1 tablet (25 mg total) by mouth daily.   No facility-administered encounter medications on file as of 06/01/2021.    Allergies (verified) Patient has no known allergies.   History: Past Medical History:  Diagnosis Date   Diverticulosis of colon    Erectile dysfunction due to arterial insufficiency    Hematospermia    Hx of colonic polyps 2007   Hyperlipidemia    Hypertension    Syncope 2002   Past Surgical History:  Procedure Laterality Date   APPENDECTOMY  1992   CATARACT EXTRACTION Bilateral 06/13/2017   COLONOSCOPY  W/ POLYPECTOMY  2007 & 2012   Dr.Perry ; hyperplastic   INGUINAL HERNIA REPAIR  2009   bilateral   MANDIBLE FRACTURE SURGERY  1964   Family History  Problem Relation Age of Onset   Heart failure Father    Hypertension Father    Hypertension Mother    Alzheimer's disease Mother    Cancer Mother        GYN   Diverticulitis Brother    Diabetes Sister    Heart attack Brother 50   Colon cancer Neg Hx     Stomach cancer Neg Hx    Rectal cancer Neg Hx    Stroke Neg Hx    Prostate cancer Neg Hx    Social History   Socioeconomic History   Marital status: Married    Spouse name: Not on file   Number of children: 2   Years of education: Not on file   Highest education level: Not on file  Occupational History   Occupation: retired Music therapist: [employer]  Tobacco Use   Smoking status: Former    Types: Cigarettes    Quit date: 07/12/1980    Years since quitting: 40.9   Smokeless tobacco: Never   Tobacco comments:    smoked 1959-1982, up to 1/2 ppd  Substance and Sexual Activity   Alcohol use: Yes    Comment:  socially   Drug use: No   Sexual activity: Not on file  Other Topics Concern   Not on file  Social History Narrative   Household pt and wife; she has dementia   One child in [place], another in [place] Strain: Low Risk    Difficulty of Paying Living Expenses: Not hard at all  Food Insecurity: No Food Insecurity   Worried About Charity fundraiser in the Last Year: Never true   Arboriculturist in the Last Year: Never true  Transportation Needs: No Transportation Needs   Lack of Transportation (Medical): No   Lack of Transportation (Non-Medical): No  Physical Activity: Inactive   Days of Exercise per Week: 0 days   Minutes of Exercise per Session: 0 min  Stress: No Stress Concern Present   Feeling of Stress : Not at all  Social Connections: Moderately Integrated   Frequency of Communication with Friends and Family: More than three times a week   Frequency of Social Gatherings with Friends and Family: More than three times a week   Attends Religious Services: More than 4 times per year   Active Member of [employer] or Organizations: No   Attends Archivist Meetings: Never   Marital Status: Married    Tobacco Counseling Counseling given: Not Answered Tobacco comments: smoked [phone],  up to 1/2 ppd   Clinical Intake:  Pre-visit preparation completed: Yes  Pain : No/denies pain     BMI - recorded: 33.38 Nutritional Status: BMI > 30  Obese Nutritional Risks: None Diabetes: No  How often do you need to have someone help you when you read instructions, pamphlets, or other written materials from your doctor or pharmacy?: 1 - Never  Diabetic?No  Interpreter Needed?: No  Information entered by :: Caroleen Hamman LPN   Activities of Daily Living In your present state of health, do you have any difficulty performing the following activities: 06/01/2021  Hearing? N  Vision? N  Difficulty concentrating or making decisions? N  Walking or  climbing stairs? N  Dressing or bathing? N  Doing errands, shopping? N  Preparing Food and eating ? N  Using the Toilet? N  In the past six months, have you accidently leaked urine? N  Do you have problems with loss of bowel control? N  Managing your Medications? N  Managing your Finances? N  Housekeeping or managing your Housekeeping? N  Some recent data might be hidden    Patient Care Team: Colon Branch, MD as PCP - General (Internal Medicine) Franchot Gallo, MD as Consulting Physician (Urology) Sydnee Levans, MD as Consulting Physician (Dermatology)  Indicate any recent Medical Services you may have received from other than [facility] providers in the past year (date may be approximate).     Assessment:   This is a routine wellness examination for Louis.  Hearing/Vision screen Hearing Screening - Comments:: No issues Vision Screening - Comments:: Last eye exam-several years ago  Dietary issues and exercise activities discussed: Current Exercise Habits: The patient does not participate in regular exercise at present, Exercise limited by: None identified   Goals Addressed             This Visit's Progress    DIET - EAT MORE FRUITS AND VEGETABLES   Not on track    DIET - [place]   On track     Increase physical activity   Not on track      Depression Screen PHQ 2/9 Scores 06/01/2021 01/04/2020 01/04/2019 03/29/2018 02/08/2017 11/26/2015 03/18/2015  PHQ - 2 Score 0 0 0 0 0 0 0    Fall Risk Fall Risk  06/01/2021 01/04/2020 06/12/2019 01/04/2019 03/29/2018  Falls in the past year? 0 0 0 0 No  Number falls in past yr: 0 0 - - -  Injury with Fall? 0 0 - - -  Follow up Falls prevention discussed Education provided;Falls prevention discussed Falls evaluation completed - -    FALL RISK PREVENTION PERTAINING TO THE HOME:  Any stairs in or around the home? No  Home free of loose throw rugs in walkways, pet beds, electrical cords, etc? Yes  Adequate lighting in your home to reduce risk of falls? Yes   ASSISTIVE DEVICES UTILIZED TO PREVENT FALLS:  Life alert? No  Use of a cane, walker or w/c? No  Grab bars in the bathroom? Yes  Shower chair or bench in shower? No  Elevated toilet seat or a handicapped toilet? No   TIMED UP AND GO:  Was the test performed? No . Phone visit   Cognitive Function: Patient has current diagnosis of mild cognitive impairment. No issues answering questions today.  MMSE - Mini Mental State Exam 10/15/2019  Orientation to time 4  Orientation to Place 5  Registration 3  Attention/ Calculation 0  Recall 0  Language- name 2 objects 2  Language- repeat 1  Language- follow 3 step command 3  Language- read & follow direction 1  Write a sentence 1  Copy design 1  Total score 21        Immunizations Immunization History  Administered Date(s) Administered   PFIZER(Purple Top)SARS-COV-2 Vaccination 10/05/2019, 10/26/2019   Pneumococcal Conjugate-13 11/26/2015   Pneumococcal Polysaccharide-23 09/20/2014   Tetanus 01/10/2013   Zoster, Live 07/09/2010    TDAP status: Up to date  Flu Vaccine status: Declined, Education has been provided regarding the importance of this vaccine but patient still declined. Advised may receive this vaccine at local pharmacy  or Health Dept. Aware to provide a  copy of the vaccination record if obtained from local pharmacy or Health Dept. Verbalized acceptance and understanding.  Pneumococcal vaccine status: Up to date  Covid-19 vaccine status: Information provided on how to obtain vaccines.   Qualifies for Shingles Vaccine? Yes   Zostavax completed Yes   Shingrix Completed?: No.    Education has been provided regarding the importance of this vaccine. Patient has been advised to call insurance company to determine out of pocket expense if they have not yet received this vaccine. Advised may also receive vaccine at local pharmacy or Health Dept. Verbalized acceptance and understanding.  Screening Tests Health Maintenance  Topic Date Due   Zoster Vaccines- Shingrix (1 of 2) Never done   COVID-19 Vaccine (3 - Pfizer risk series) 11/23/2019   INFLUENZA VACCINE  Never done   TETANUS/TDAP  01/11/2023   Pneumonia Vaccine 42+ Years old  Completed   Hepatitis C Screening  Completed   HPV VACCINES  Aged Out    Health Maintenance  Health Maintenance Due  Topic Date Due   Zoster Vaccines- Shingrix (1 of 2) Never done   COVID-19 Vaccine (3 - Pfizer risk series) 11/23/2019   INFLUENZA VACCINE  Never done    Colorectal cancer screening: No longer required.   Lung Cancer Screening: (Low Dose CT Chest recommended if Age 26-80 years, 30 pack-year currently smoking OR have quit w/in 15years.) does not qualify.     Additional Screening:  Hepatitis C Screening: Completed 08/25/2016  Vision Screening: Recommended annual ophthalmology exams for early detection of glaucoma and other disorders of the eye. Is the patient up to date with their annual eye exam?  No  Who is the provider or what is the name of the office in which the patient attends annual eye exams? None-patient states he will make an appt   Dental Screening: Recommended annual dental exams for proper oral hygiene  Community Resource Referral / Chronic  Care Management: CRR required this visit?  No   CCM required this visit?  No      Plan:     I have personally reviewed and noted the following in the patient's chart:   Medical and social history Use of alcohol, tobacco or illicit drugs  Current medications and supplements including opioid prescriptions. Patient is not currently taking opioid prescriptions. Functional ability and status Nutritional status Physical activity Advanced directives List of other physicians Hospitalizations, surgeries, and ER visits in previous 12 months Vitals Screenings to include cognitive, depression, and falls Referrals and appointments  In addition, I have reviewed and discussed with patient certain preventive protocols, quality metrics, and best practice recommendations. A written personalized care plan for preventive services as well as general preventive health recommendations were provided to patient.   Due to this being a telephonic visit, the after visit summary with patients personalized plan was offered to patient via mail or my-chart. Patient declined at this time.   Marta Antu, LPN   [id number]/12/[id number]  Nurse health Advisor  Nurse Notes: None  I have reviewed and agree with [facility] documentation.  Kathlene November, MD

## 2021-06-01 NOTE — Patient Instructions (Signed)
[name] , Thank you for taking time to complete your Medicare Wellness Visit. I appreciate your ongoing commitment to your health goals. Please review the following plan we discussed and let me know if I can assist you in the future.   Screening recommendations/referrals: Colonoscopy: No longer required Recommended yearly ophthalmology/optometry visit for glaucoma screening and checkup Recommended yearly dental visit for hygiene and checkup  Vaccinations: Influenza vaccine: Declined Pneumococcal vaccine: Up to date Tdap vaccine: Up to date Shingles vaccine: Discuss with pharmacy   Covid-19: Booster available at the pharmacy  Advanced directives: Please bring a copy of Living Will and/or Healthcare Power of Attorney for your chart.   Conditions/risks identified: See problem list  Next appointment: Follow up in one year for your annual wellness visit. 06/07/2022 @ 3:00  Preventive Care 79 Years and Older, Male Preventive care refers to lifestyle choices and visits with your health care provider that can promote health and wellness. What does preventive care include? A yearly physical exam. This is also called an annual well check. Dental exams once or twice a year. Routine eye exams. Ask your health care provider how often you should have your eyes checked. Personal lifestyle choices, including: Daily care of your teeth and gums. Regular physical activity. Eating a healthy diet. Avoiding tobacco and drug use. Limiting alcohol use. Practicing safe sex. Taking low doses of aspirin every day. Taking vitamin and mineral supplements as recommended by your health care provider. What happens during an annual well check? The services and screenings done by your health care provider during your annual well check will depend on your age, overall health, lifestyle risk factors, and family history of disease. Counseling  Your health care provider may ask you questions about  your: Alcohol use. Tobacco use. Drug use. Emotional well-being. Home and relationship well-being. Sexual activity. Eating habits. History of falls. Memory and ability to understand (cognition). Work and work Statistician. Screening  You may have the following tests or measurements: Height, weight, and BMI. Blood pressure. Lipid and cholesterol levels. These may be checked every 5 years, or more frequently if you are over 46 years old. Skin check. Lung cancer screening. You may have this screening every year starting at age 31 if you have a 30-pack-year history of smoking and currently smoke or have quit within the past 15 years. Fecal occult blood test (FOBT) of the stool. You may have this test every year starting at age 28. Flexible sigmoidoscopy or colonoscopy. You may have a sigmoidoscopy every 5 years or a colonoscopy every 10 years starting at age 80. Prostate cancer screening. Recommendations will vary depending on your family history and other risks. Hepatitis C blood test. Hepatitis B blood test. Sexually transmitted disease (STD) testing. Diabetes screening. This is done by checking your blood sugar (glucose) after you have not eaten for a while (fasting). You may have this done every 1-3 years. Abdominal aortic aneurysm (AAA) screening. You may need this if you are a current or former smoker. Osteoporosis. You may be screened starting at age 53 if you are at high risk. Talk with your health care provider about your test results, treatment options, and if necessary, the need for more tests. Vaccines  Your health care provider may recommend certain vaccines, such as: Influenza vaccine. This is recommended every year. Tetanus, diphtheria, and acellular pertussis (Tdap, Td) vaccine. You may need a Td booster every 10 years. Zoster vaccine. You may need this after age 74. Pneumococcal 13-valent conjugate (PCV13) vaccine.  One dose is recommended after age 10. Pneumococcal  polysaccharide (PPSV23) vaccine. One dose is recommended after age 59. Talk to your health care provider about which screenings and vaccines you need and how often you need them. This information is not intended to replace advice given to you by your health care provider. Make sure you discuss any questions you have with your health care provider. Document Released: 07/25/2015 Document Revised: 03/17/2016 Document Reviewed: 04/29/2015 Elsevier Interactive Patient Education  2017 [employer] Prevention in the Home Falls can cause injuries. They can happen to people of all ages. There are many things you can do to make your home safe and to help prevent falls. What can I do on the outside of my home? Regularly fix the edges of walkways and driveways and fix any cracks. Remove anything that might make you trip as you walk through a door, such as a raised step or threshold. Trim any bushes or trees on the path to your home. Use bright outdoor lighting. Clear any walking paths of anything that might make someone trip, such as rocks or tools. Regularly check to see if handrails are loose or broken. Make sure that both sides of any steps have handrails. Any raised decks and porches should have guardrails on the edges. Have any leaves, snow, or ice cleared regularly. Use sand or salt on walking paths during winter. Clean up any spills in your garage right away. This includes oil or grease spills. What can I do in the bathroom? Use night lights. Install grab bars by the toilet and in the tub and shower. Do not use towel bars as grab bars. Use non-skid mats or decals in the tub or shower. If you need to sit down in the shower, use a plastic, non-slip stool. Keep the floor dry. Clean up any water that spills on the floor as soon as it happens. Remove soap buildup in the tub or shower regularly. Attach bath mats securely with double-sided non-slip rug tape. Do not have throw rugs and other  things on the floor that can make you trip. What can I do in the bedroom? Use night lights. Make sure that you have a light by your bed that is easy to reach. Do not use any sheets or blankets that are too big for your bed. They should not hang down onto the floor. Have a firm chair that has side arms. You can use this for support while you get dressed. Do not have throw rugs and other things on the floor that can make you trip. What can I do in the kitchen? Clean up any spills right away. Avoid walking on wet floors. Keep items that you use a lot in easy-to-reach places. If you need to reach something above you, use a strong step stool that has a grab bar. Keep electrical cords out of the way. Do not use floor polish or wax that makes floors slippery. If you must use wax, use non-skid floor wax. Do not have throw rugs and other things on the floor that can make you trip. What can I do with my stairs? Do not leave any items on the stairs. Make sure that there are handrails on both sides of the stairs and use them. Fix handrails that are broken or loose. Make sure that handrails are as long as the stairways. Check any carpeting to make sure that it is firmly attached to the stairs. Fix any carpet that is loose or  worn. Avoid having throw rugs at the top or bottom of the stairs. If you do have throw rugs, attach them to the floor with carpet tape. Make sure that you have a light switch at the top of the stairs and the bottom of the stairs. If you do not have them, ask someone to add them for you. What else can I do to help prevent falls? Wear shoes that: Do not have high heels. Have rubber bottoms. Are comfortable and fit you well. Are closed at the toe. Do not wear sandals. If you use a stepladder: Make sure that it is fully opened. Do not climb a closed stepladder. Make sure that both sides of the stepladder are locked into place. Ask someone to hold it for you, if possible. Clearly  mark and make sure that you can see: Any grab bars or handrails. First and last steps. Where the edge of each step is. Use tools that help you move around (mobility aids) if they are needed. These include: Canes. Walkers. Scooters. Crutches. Turn on the lights when you go into a dark area. Replace any light bulbs as soon as they burn out. Set up your furniture so you have a clear path. Avoid moving your furniture around. If any of your floors are uneven, fix them. If there are any pets around you, be aware of where they are. Review your medicines with your doctor. Some medicines can make you feel dizzy. This can increase your chance of falling. Ask your doctor what other things that you can do to help prevent falls. This information is not intended to replace advice given to you by your health care provider. Make sure you discuss any questions you have with your health care provider. Document Released: 04/24/2009 Document Revised: 12/04/2015 Document Reviewed: 08/02/2014 Elsevier Interactive Patient Education  2017 [employer].

## 2021-07-17 ENCOUNTER — Emergency Department (HOSPITAL_COMMUNITY)
Admission: EM | Admit: 2021-07-17 | Discharge: 2021-07-18 | Disposition: A | Discharge status: Home / Self Care | Payer: Medicare Other | Attending: Emergency Medicine | Admitting: Emergency Medicine

## 2021-07-17 ENCOUNTER — Other Ambulatory Visit: Payer: Self-pay

## 2021-07-17 ENCOUNTER — Encounter (HOSPITAL_COMMUNITY): Payer: Self-pay

## 2021-07-17 DIAGNOSIS — Z7982 Long term (current) use of aspirin: Secondary | ICD-10-CM | POA: Diagnosis not present

## 2021-07-17 DIAGNOSIS — Z79899 Other long term (current) drug therapy: Secondary | ICD-10-CM | POA: Insufficient documentation

## 2021-07-17 DIAGNOSIS — Z743 Need for continuous supervision: Secondary | ICD-10-CM | POA: Diagnosis not present

## 2021-07-17 DIAGNOSIS — R000 Tachycardia, unspecified: Secondary | ICD-10-CM | POA: Insufficient documentation

## 2021-07-17 DIAGNOSIS — R1030 Lower abdominal pain, unspecified: Secondary | ICD-10-CM | POA: Insufficient documentation

## 2021-07-17 DIAGNOSIS — K5730 Diverticulosis of large intestine without perforation or abscess without bleeding: Secondary | ICD-10-CM | POA: Diagnosis not present

## 2021-07-17 DIAGNOSIS — R6889 Other general symptoms and signs: Secondary | ICD-10-CM | POA: Diagnosis not present

## 2021-07-17 DIAGNOSIS — I10 Essential (primary) hypertension: Secondary | ICD-10-CM | POA: Diagnosis not present

## 2021-07-17 DIAGNOSIS — I499 Cardiac arrhythmia, unspecified: Secondary | ICD-10-CM | POA: Diagnosis not present

## 2021-07-17 DIAGNOSIS — R339 Retention of urine, unspecified: Secondary | ICD-10-CM | POA: Diagnosis not present

## 2021-07-17 DIAGNOSIS — I700 Atherosclerosis of aorta: Secondary | ICD-10-CM | POA: Diagnosis not present

## 2021-07-17 LAB — COMPREHENSIVE METABOLIC PANEL
ALT: 27 U/L (ref 0–44)
AST: 30 U/L (ref 15–41)
Albumin: 4.2 g/dL (ref 3.5–5.0)
Alkaline Phosphatase: 91 U/L (ref 38–126)
Anion gap: 10 (ref 5–15)
BUN: 17 mg/dL (ref 8–23)
CO2: 21 mmol/L — ABNORMAL LOW (ref 22–32)
Calcium: 9.1 mg/dL (ref 8.9–10.3)
Chloride: 103 mmol/L (ref 98–111)
Creatinine, Ser: 0.93 mg/dL (ref 0.61–1.24)
GFR, Estimated: >60 mL/min (ref >60)
Glucose, Bld: 161 mg/dL — ABNORMAL HIGH (ref 70–99)
Potassium: 4.2 mmol/L (ref 3.5–5.1)
Sodium: 134 mmol/L — ABNORMAL LOW (ref 135–145)
Total Bilirubin: 1.1 mg/dL (ref 0.3–1.2)
Total Protein: 7.7 g/dL (ref 6.5–8.1)

## 2021-07-17 LAB — CBC WITH DIFFERENTIAL/PLATELET
Abs Immature Granulocytes: 0.03 10*3/uL (ref 0.00–0.07)
Basophils Absolute: 0 10*3/uL (ref 0.0–0.1)
Basophils Relative: 0 %
Eosinophils Absolute: 0 10*3/uL (ref 0.0–0.5)
Eosinophils Relative: 0 %
HCT: 49 % (ref 39.0–52.0)
Hemoglobin: 16.9 g/dL (ref 13.0–17.0)
Immature Granulocytes: 0 %
Lymphocytes Relative: 14 %
Lymphs Abs: 1.4 10*3/uL (ref 0.7–4.0)
MCH: 30.8 pg (ref 26.0–34.0)
MCHC: 34.5 g/dL (ref 30.0–36.0)
MCV: 89.3 fL (ref 80.0–100.0)
Monocytes Absolute: 0.6 10*3/uL (ref 0.1–1.0)
Monocytes Relative: 6 %
Neutro Abs: 7.8 10*3/uL — ABNORMAL HIGH (ref 1.7–7.7)
Neutrophils Relative %: 80 %
Platelets: 243 10*3/uL (ref 150–400)
RBC: 5.49 MIL/uL (ref 4.22–5.81)
RDW: 12.7 % (ref 11.5–15.5)
WBC: 9.8 10*3/uL (ref 4.0–10.5)
nRBC: 0 % (ref 0.0–0.2)

## 2021-07-17 NOTE — ED Triage Notes (Signed)
Urinary retention since this morning.

## 2021-07-17 NOTE — ED Provider Notes (Signed)
[facility] DEPT Provider Note   CSN: [id number] Arrival date & time: 07/17/21  2234     History  Chief Complaint  Patient presents with   Urinary Retention    Nicholas Burgess is a 81 y.o. male.  HPI Patient is a 81 year old male with a history of hyperlipidemia, hypertension, mild cognitive impairment, diverticulitis, hyperglycemia, who presents to the emergency department due to urinary retention.  Patient states that he was urinating normally this morning but has a day progressed he began having difficulty urinating and can no longer urinate.  Reports lower abdominal pain.  Denies fevers or vomiting.  Currently A&O x2 and is only oriented to person and place.  His granddaughter at bedside states that he was currently behaving at baseline.  She states that he was complaining of difficulty with urination this afternoon.  Denies a history of similar symptoms.  Home Medications Prior to Admission medications   Medication Sig Start Date End Date Taking? Authorizing Provider  acetaminophen (TYLENOL) 500 MG tablet Take 500 mg by mouth every 6 (six) hours as needed for pain. For headache pain    [provider]  aspirin 81 MG tablet Take 81 mg by mouth daily.    [provider]  atorvastatin (LIPITOR) 40 MG tablet Take 1 tablet (40 mg total) by mouth at bedtime. 03/04/21   Colon Branch, MD  donepezil (ARICEPT) 10 MG tablet TAKE 1 TABLET BY MOUTH AT BEDTIME. 04/27/21   Colon Branch, MD  hydrochlorothiazide (HYDRODIURIL) 25 MG tablet TAKE 1 TABLET (25 MG TOTAL) BY MOUTH DAILY. 04/21/21   Colon Branch, MD  losartan (COZAAR) 100 MG tablet TAKE 1 TABLET (100 MG TOTAL) BY MOUTH DAILY. 01/26/21   Colon Branch, MD  metoprolol succinate (TOPROL-XL) 100 MG 24 hr tablet Take 1.5 tablets (150 mg total) by mouth daily. 04/21/21   Colon Branch, MD  Multiple Vitamin (MULTIVITAMIN) tablet Take 1 tablet by mouth daily.    [provider]  Omega-3 Fatty Acids  (FISH OIL) 1000 MG CAPS Take 1,000 mg by mouth daily.    [provider]  PROLENSA 0.07 % SOLN Take as directed 06/06/17   [provider]  spironolactone (ALDACTONE) 25 MG tablet Take 1 tablet (25 mg total) by mouth daily. 09/08/20   Colon Branch, MD      Allergies    Patient has no known allergies.    Review of Systems   Review of Systems  All other systems reviewed and are negative. Ten systems reviewed and are negative for acute change, except as noted in the HPI.   Physical Exam Updated Vital Signs BP (!) 191/109    Pulse (!) 104    Temp (!) 97.5 F (36.4 C)    Resp 17    Ht 6\' 2"  (1.88 m)    Wt 117.9 kg    SpO2 93%    BMI 33.38 kg/m  Physical Exam Vitals and nursing note reviewed.  Constitutional:      General: He is not in acute distress.    Appearance: Normal appearance. He is not ill-appearing, toxic-appearing or diaphoretic.  HENT:     Head: Normocephalic and atraumatic.     Right Ear: External ear normal.     Left Ear: External ear normal.     Nose: Nose normal.     Mouth/Throat:     Mouth: Mucous membranes are moist.     Pharynx: Oropharynx is clear. No oropharyngeal  exudate or posterior oropharyngeal erythema.  Eyes:     Extraocular Movements: Extraocular movements intact.  Cardiovascular:     Rate and Rhythm: Regular rhythm. Tachycardia present.     Pulses: Normal pulses.     Heart sounds: Normal heart sounds. No murmur heard.   No friction rub. No gallop.  Pulmonary:     Effort: Pulmonary effort is normal. No respiratory distress.     Breath sounds: Normal breath sounds. No stridor. No wheezing, rhonchi or rales.  Abdominal:     General: Abdomen is flat.     Palpations: Abdomen is soft.     Tenderness: There is abdominal tenderness.     Comments: Protuberant abdomen that is soft.  Moderate tenderness noted in the suprapubic region.  Musculoskeletal:        General: Normal range of motion.     Cervical back: Normal range of motion and neck  supple. No tenderness.  Skin:    General: Skin is warm and dry.  Neurological:     General: No focal deficit present.     Mental Status: He is alert.     Comments: Moving all 4 extremities with ease.  Oriented to person and place.  Psychiatric:        Mood and Affect: Mood normal.        Behavior: Behavior normal.   ED Results / Procedures / Treatments   Labs (all labs ordered are listed, but only abnormal results are displayed) Labs Reviewed  COMPREHENSIVE METABOLIC PANEL - Abnormal; Notable for the following components:      Result Value   Sodium 134 (*)    CO2 21 (*)    Glucose, Bld 161 (*)    All other components within normal limits  CBC WITH DIFFERENTIAL/PLATELET - Abnormal; Notable for the following components:   Neutro Abs 7.8 (*)    All other components within normal limits  URINALYSIS, ROUTINE W REFLEX MICROSCOPIC - Abnormal; Notable for the following components:   Hgb urine dipstick MODERATE (*)    Protein, ur 30 (*)    Bacteria, UA RARE (*)    All other components within normal limits  URINE CULTURE   EKG EKG Interpretation  Date/Time:  Saturday July 18 2021 02:24:27 EST Ventricular Rate:  106 PR Interval:  158 QRS Duration: 93 QT Interval:  341 QTC Calculation: 453 R Axis:   86 Text Interpretation: Sinus tachycardia Borderline right axis deviation Confirmed by Quintella Reichert [phone]) on 07/18/2021 2:51:49 AM  Radiology CT ABDOMEN PELVIS WO CONTRAST  Result Date: 07/18/2021 CLINICAL DATA:  BPH and suspected urinary retention. EXAM: CT ABDOMEN AND PELVIS WITHOUT CONTRAST TECHNIQUE: Multidetector CT imaging of the abdomen and pelvis was performed following the standard protocol without IV contrast. COMPARISON:  CT abdomen pelvis dated 12/02/2005. FINDINGS: Evaluation of this exam is limited in the absence of intravenous contrast. Lower chest: There is eventration of the diaphragms with minimal bibasilar atelectasis. There is coronary vascular calcifications.  Calcified mediastinal granuloma. No intra-abdominal free air or free fluid. Hepatobiliary: The liver is unremarkable. No intrahepatic biliary dilatation. The gallbladder is unremarkable. Pancreas: Unremarkable. No pancreatic ductal dilatation or surrounding inflammatory changes. Spleen: Normal in size without focal abnormality. Adrenals/Urinary Tract: The adrenal glands unremarkable. A 15 mm high attenuating lesion in the inferior pole of the left kidney is not characterized but may represent a proteinaceous or complex cyst. This can be better evaluated with ultrasound on a nonemergent/outpatient basis. There is no hydronephrosis or nephrolithiasis on either side.  The visualized ureters appear unremarkable. The urinary bladder is decompressed around a Foley catheter. Stomach/Bowel: There is no bowel obstruction or active inflammation. There is sigmoid diverticulosis and scattered colonic diverticula without active inflammatory changes. Appendectomy. Vascular/Lymphatic: Moderate aortoiliac atherosclerotic disease. The IVC is unremarkable. No portal venous gas. There is no adenopathy. Reproductive: Enlarged prostate gland measuring 7 cm in transverse axial diameter. There is median lobe hypertrophy with compression and indentation of the base of the bladder. Other: None Musculoskeletal: Degenerative changes of the spine. No acute osseous pathology. IMPRESSION: 1. No acute intra-abdominal or pelvic pathology. No hydronephrosis or nephrolithiasis. 2. Enlarged prostate gland with median lobe hypertrophy with compression and indentation of the base of the bladder. 3. Colonic diverticulosis. 4. Aortic Atherosclerosis (ICD10-I70.0). Electronically Signed   By: Anner Crete M.D.   On: 07/18/2021 03:41    Procedures Procedures   Medications Ordered in ED Medications - No data to display  ED Course/ Medical Decision Making/ A&P Clinical Course as of 07/18/21 0353  Sat Jul 18, 2021  0027 Patient reassessed.   Patient has about 500 cc of urine output since the Foley catheter was placed.  Blood work appears generally reassuring.  UA is pending. [LJ]    Clinical Course User Index [LJ] Rayna Sexton, PA-C                           Medical Decision Making Pt is a 81 y.o. male who presents to the emergency department due to urinary retention.  Labs: CBC with neutrophils 7.8. CMP with a sodium 134, CO2 21, glucose of 161. UA with moderate hemoglobin, 30 protein, rare bacteria.  Imaging: CT scan of the abdomen/pelvis without contrast shows no acute intra-abdominal or pelvic pathology.  No hydronephrosis or nephrolithiasis.  Enlarged prostate gland with median lobe hypertrophy with compression and indentation of the base of the bladder.  Colonic diverticulosis.  Aortic atherosclerosis.  I, Rayna Sexton, PA-C, personally reviewed and evaluated these images and lab results as part of my medical decision-making.  Foley catheter was placed upon patient's arrival.  Patient produced more than 500 cc of urine.  CT scan findings as noted above.  Symptoms today likely secondary to BPH.  UA with 30 protein and rare bacteria.  Does not appear infectious.  Urine culture sent.  CBC without leukocytosis.  Patient afebrile.  Patient persistently tachycardic since arrival.  Per records, it appears that he used to take metoprolol but is no longer on this medication.  ECG shows sinus tachycardia.  Do not feel that there is likely an underlying medical illness causing his tachycardia at this time.  Will discharge patient with Foley catheter in place.  Patient given a referral to [facility].  Patient and his son at bedside are going to call them first thing on Monday morning to schedule an appointment for reevaluation.  Discussed return precautions.  Feel he is stable for discharge at this time and he and his son at bedside are agreeable.  Their questions were answered and they were amicable at the time of  discharge.  Patient discussed with and evaluated by my attending physician Dr. Quintella Reichert who was in agreement with the above plan.  Note: Portions of this report may have been transcribed using voice recognition software. Every effort was made to ensure accuracy; however, inadvertent computerized transcription errors may be present.   Final Clinical Impression(s) / ED Diagnoses Final diagnoses:  Urinary retention   Rx /  DC Orders ED Discharge Orders     None         Rayna Sexton, Hershal Coria 07/18/21 0359    Quintella Reichert, MD 07/19/21 0100

## 2021-07-18 ENCOUNTER — Emergency Department (HOSPITAL_COMMUNITY): Payer: Medicare Other

## 2021-07-18 DIAGNOSIS — I700 Atherosclerosis of aorta: Secondary | ICD-10-CM | POA: Diagnosis not present

## 2021-07-18 DIAGNOSIS — K5730 Diverticulosis of large intestine without perforation or abscess without bleeding: Secondary | ICD-10-CM | POA: Diagnosis not present

## 2021-07-18 LAB — URINALYSIS, ROUTINE W REFLEX MICROSCOPIC
Bilirubin Urine: NEGATIVE
Glucose, UA: NEGATIVE mg/dL
Ketones, ur: NEGATIVE mg/dL
Leukocytes,Ua: NEGATIVE
Nitrite: NEGATIVE
Protein, ur: 30 mg/dL — AB
Specific Gravity, Urine: 1.009 (ref 1.005–1.030)
pH: 5 (ref 5.0–8.0)

## 2021-07-18 NOTE — Discharge Instructions (Addendum)
Like we discussed, please keep the catheter in place until you can be reevaluated by urology.  Below is the contact information for [facility].  They are located near this emergency department.  Please give them a call on Monday morning to schedule an appointment for reevaluation.  If you develop any new or worsening symptoms please come back to the emergency department immediately.

## 2021-07-18 NOTE — ED Notes (Signed)
Patients foley changed into a leg bag. Patient educated on foley care.

## 2021-07-19 ENCOUNTER — Encounter: Payer: Self-pay | Admitting: Internal Medicine

## 2021-07-19 LAB — URINE CULTURE: Culture: NO GROWTH

## 2021-07-21 ENCOUNTER — Encounter: Payer: Self-pay | Admitting: Internal Medicine

## 2021-07-21 ENCOUNTER — Other Ambulatory Visit: Payer: Self-pay | Admitting: Internal Medicine

## 2021-07-21 ENCOUNTER — Ambulatory Visit (INDEPENDENT_AMBULATORY_CARE_PROVIDER_SITE_OTHER): Payer: Medicare Other | Admitting: Internal Medicine

## 2021-07-21 VITALS — BP 136/92 | HR 102 | Temp 97.8°F | Resp 16 | Ht 74.0 in | Wt 254.2 lb

## 2021-07-21 DIAGNOSIS — F03B0 Unspecified dementia, moderate, without behavioral disturbance, psychotic disturbance, mood disturbance, and anxiety: Secondary | ICD-10-CM

## 2021-07-21 DIAGNOSIS — E785 Hyperlipidemia, unspecified: Secondary | ICD-10-CM

## 2021-07-21 DIAGNOSIS — I10 Essential (primary) hypertension: Secondary | ICD-10-CM | POA: Diagnosis not present

## 2021-07-21 DIAGNOSIS — R339 Retention of urine, unspecified: Secondary | ICD-10-CM | POA: Diagnosis not present

## 2021-07-21 MED ORDER — METOPROLOL SUCCINATE ER 100 MG PO TB24: 150.0000 mg | ORAL_TABLET | Freq: Every day | ORAL | 1 refills | Status: DC

## 2021-07-21 MED ORDER — DONEPEZIL HCL 10 MG PO TABS: 10.0000 mg | ORAL_TABLET | Freq: Every day | ORAL | 1 refills | Status: DC

## 2021-07-21 MED ORDER — ATORVASTATIN CALCIUM 40 MG PO TABS: 40.0000 mg | ORAL_TABLET | Freq: Every day | ORAL | 1 refills | Status: DC

## 2021-07-21 NOTE — Patient Instructions (Addendum)
Medications I recommend: Atorvastatin 40 mg 1 tablet daily (cholesterol medication) Metoprolol XL 100 mg: 1.5 tablet daily  (this is for blood pressure) Aricept 10 mg 1 tablet at bedtime (this is for memory)  You may have several other medications on file in the pharmacy but you only need to take these 3 medicines.   Check the  blood pressure regularly BP GOAL is between 110/65 and  135/85. If it is consistently higher or lower, let me know    GO TO THE FRONT DESK, Day back for    a checkup in 6 weeks

## 2021-07-21 NOTE — Assessment & Plan Note (Signed)
Compliance: Last visit 06/19/2020, DNKA x2. Unclear if he is taking his routine medications, we have not refilled them. Urinary retention: Tolerating the Foley well, to see urology in 2 days. Prediabetes: Needs a A1c on RTC HTN: Used to be on HCTZ, losartan, Aldactone, metoprolol.  Apparently has not been taking his meds lately, consequently will only restart metoprolol, BP today is okay, he is a slightly tachycardic.  Monitor BPs and add other medications if needed Hyperlipidemia: Restart atorvastatin. Dementia: Here with his son, he report deterioration of his memory in the last year.  Although I did not do MMSE is obvious that his memory is much worse and he denies having any issues w/ his memory.  The patient lives with his wife who has also some memory issues. Plan: Family plans to be involved helping him taking medications.  I am not sure if driving is safe for him I asked his son to monitor the situation.  They are aware that in the future he will need more help; palliative care, private help  or home health agency will be of help . Preventive care: Declined flu shot, recommend a COVID-vaccine RTC 6 months

## 2021-07-21 NOTE — Progress Notes (Signed)
Subjective:    Patient ID: Nicholas Burgess, male    DOB: [birth year], 81 y.o.   MRN: [id number]  DOS:  07/21/2021 Type of visit - description: ED follow-up  Went to the ER 07/17/2021: He realized he could not urinate. At that time did not have any fever chills No nausea or vomiting. At the ER, was noted to be tachycardic, Foley catheter was placed, more than 500 cc of urine obtained. Work-up: CT abdominal pelvis: No hydronephrosis or nephrolithiasis.  Enlarged prostate. CBC, CMP, urinalysis: Sodium 134, blood sugar 161, normal kidney function, rare bacteria in the urine, urine culture negative.  He is here with his son. Since the ER visit he is doing okay and other than the discomfort from the Foley there is no other symptoms.     Review of Systems See above   Past Medical History:  Diagnosis Date   Diverticulosis of colon    Erectile dysfunction due to arterial insufficiency    Hematospermia    Hx of colonic polyps 2007   Hyperlipidemia    Hypertension    Syncope 2002    Past Surgical History:  Procedure Laterality Date   APPENDECTOMY  1992   CATARACT EXTRACTION Bilateral 06/13/2017   COLONOSCOPY W/ POLYPECTOMY  2007 & 2012   Dr.Perry ; hyperplastic   INGUINAL HERNIA REPAIR  2009   bilateral   MANDIBLE FRACTURE SURGERY  1964    Current Outpatient Medications  Medication Instructions   acetaminophen (TYLENOL) 500 mg, Every 6 hours PRN   atorvastatin (LIPITOR) 40 mg, Oral, Daily at bedtime   donepezil (ARICEPT) 10 MG tablet TAKE 1 TABLET BY MOUTH AT BEDTIME.   Fish Oil 1,000 mg, Daily   metoprolol succinate (TOPROL-XL) 150 mg, Oral, Daily   Multiple Vitamin (MULTIVITAMIN) tablet 1 tablet, Daily   PROLENSA 0.07 % SOLN Take as directed       Objective:   Physical Exam BP (!) 136/92 (BP Location: Left Arm, Patient Position: Sitting, Cuff Size: Normal)    Pulse (!) 102    Temp 97.8 F (36.6 C) (Oral)    Resp 16    Ht 6\' 2"  (1.88 m)    Wt 254 lb 4 oz (115.3 kg)     SpO2 96%    BMI 32.64 kg/m  General:   Well developed, NAD, BMI noted. HEENT:  Normocephalic . Face symmetric, atraumatic Lungs:  CTA B Normal respiratory effort, no intercostal retractions, no accessory muscle use. Heart: RRR,  no murmur.  Lower extremities: no pretibial edema bilaterally  Skin: Not pale. Not jaundice Neurologic:  alert & alert to self, hesitant about the year and the day of the week.  Did not know what floor this office is, did  remember who the president is.  Recognizes his son. He could not answer any questions about how he felt other than "I feel ok", what symptoms he had, he asked his son to answer for him. Speech normal, gait appropriate for age and unassisted Psych Behavior appropriate. No anxious or depressed appearing.      Assessment       Assessment  Prediabetes HTN dc amlodipine 05-2015 dt edema Leg edema, worse on the left, neg [place] for  DVT 01/2018 Hyperlipidemia Hematospermia 1 ( 12-2014) -- saw urology 09-2015, RTC prn Syncope 2002 Skin cancer  [facility] MMSE 21 (10-2019), rx aricept    PLAN Compliance: Last visit 06/19/2020, DNKA x2. Unclear if he is taking his routine medications, we have not refilled them. Urinary  retention: Tolerating the Foley well, to see urology in 2 days. Prediabetes: Needs a A1c on RTC HTN: Used to be on HCTZ, losartan, Aldactone, metoprolol.  Apparently has not been taking his meds lately, consequently will only restart metoprolol, BP today is okay, he is a slightly tachycardic.  Monitor BPs and add other medications if needed Hyperlipidemia: Restart atorvastatin. Dementia: Here with his son, he report deterioration of his memory in the last year.  Although I did not do MMSE is obvious that his memory is much worse and he denies having any issues w/ his memory.  The patient lives with his wife who has also some memory issues. Plan: Family plans to be involved helping him taking medications.  I am not sure if driving is safe  for him I asked his son to monitor the situation.  They are aware that in the future he will need more help; palliative care, private help  or home health agency will be of help . Preventive care: Declined flu shot, recommend a COVID-vaccine RTC 6 months   Time spent 35 min This visit occurred during the SARS-CoV-2 public health emergency.  Safety protocols were in place, including screening questions prior to the visit, additional usage of staff PPE, and extensive cleaning of exam room while observing appropriate contact time as indicated for disinfecting solutions.

## 2021-07-23 DIAGNOSIS — R338 Other retention of urine: Secondary | ICD-10-CM | POA: Diagnosis not present

## 2021-07-24 ENCOUNTER — Telehealth: Payer: Self-pay

## 2021-07-24 NOTE — Telephone Encounter (Signed)
Spoke w/ Langley Gauss, verbal orders given.

## 2021-07-24 NOTE — Telephone Encounter (Signed)
Denise with [facility] and [facility] called stating patient's Son, [name], reached out to them requesting palliative care for pt and is wanting to know if Dr. Larose Kells is ok with them providing this for the pt.  She can be reached at [phone], option #2.

## 2021-07-24 NOTE — Telephone Encounter (Signed)
Yes, okay to proceed with palliative visit

## 2021-07-24 NOTE — Telephone Encounter (Signed)
Please advise 

## 2021-07-29 DIAGNOSIS — R338 Other retention of urine: Secondary | ICD-10-CM | POA: Diagnosis not present

## 2021-08-03 ENCOUNTER — Telehealth: Payer: Self-pay

## 2021-08-03 NOTE — Telephone Encounter (Signed)
Spoke with patient's son [name] and scheduled a Televisit Palliative Consult for 08/11/21 @ 12:30 PM with Dr. Hollace Kinnier. Documentation will be noted in [facility].   Consent obtained; updated Outlook/Netsmart/Team List and Epic.

## 2021-08-10 ENCOUNTER — Telehealth: Payer: Medicare Other | Admitting: Internal Medicine

## 2021-08-10 ENCOUNTER — Other Ambulatory Visit: Payer: Self-pay

## 2021-08-10 ENCOUNTER — Encounter: Payer: Self-pay | Admitting: Internal Medicine

## 2021-08-10 DIAGNOSIS — Z741 Need for assistance with personal care: Secondary | ICD-10-CM

## 2021-08-10 DIAGNOSIS — F03B0 Unspecified dementia, moderate, without behavioral disturbance, psychotic disturbance, mood disturbance, and anxiety: Secondary | ICD-10-CM

## 2021-08-10 DIAGNOSIS — Z515 Encounter for palliative care: Secondary | ICD-10-CM

## 2021-08-10 NOTE — Progress Notes (Signed)
[facility] Consult Note Telephone: [phone]  Fax: [phone]   Date of encounter: 08/10/21 1230pm  PATIENT NAME: Nicholas Burgess [address] [place] [address]   [phone] (home)  DOB: [birth year] MRN: [id number] PRIMARY CARE PROVIDER:    Colon Branch, MD,  [address] [place] [address] [phone]  REFERRING PROVIDER:   Colon Branch, [place] [address] [place],  [place] [address] [phone]  RESPONSIBLE PARTY:    Contact Information     Name Relation Home Work [place] Son   [phone]   [place] Spouse [phone]          Virtual Visit via Video Note  I connected with Nicholas Burgess and his son, Nicholas Burgess, on 08/10/21 at 12:30 PM EST by a video enabled telemedicine application and verified that I am speaking with the correct person using two identifiers.  Unfortunately, the video component would not work on Nicholas Burgess and we had to revert to televisit.  Location: Patient: Home Provider: Home office   I discussed the limitations of evaluation and management by telemedicine and the availability of in person appointments. The patient expressed understanding and agreed to proceed.  The patient was advised to call back or seek an in-person evaluation if the symptoms worsen or if the condition fails to improve as anticipated.   Palliative Care was asked to follow this patient by consultation request of  Colon Branch, MD to address advance care planning and complex medical decision making. This is the initial visit.                                     ASSESSMENT AND PLAN / RECOMMENDATIONS:   Advance Care Planning/Goals of Care: Goals include to maximize quality of life and symptom management. Patient/health care surrogate gave his/her permission to discuss.Our advance care planning conversation included a discussion about:    The value and  importance of advance care planning  Experiences with loved ones who have been seriously ill or have died  Exploration of personal, cultural or spiritual beliefs that might influence medical decisions  Exploration of goals of care in the event of a sudden injury or illness  Identification  of a healthcare agent.  At this time, Nicholas Burgess remains able to determine his healthcare agent (his son, Nicholas Burgess) and I sent Nicholas Burgess blank [place] living will and HCPOA documents for completion and notarizing and advised that I could then upload these to vynca in epic for all of the providers to access Review and updating or creation of an  advance directive document . Decision not to resuscitate or to de-escalate disease focused treatments due to poor prognosis. CODE STATUS:  TBD Introduced concept of DNR and MOST forms.    Symptom Management/Plan: 1. Moderate dementia without behavioral disturbance, psychotic disturbance, mood disturbance, or anxiety, unspecified dementia type -scored 21/30 in 2021 on MMSE -repeats himself often during visit -son helps with pillbox and being sure both parents have meals, but this is challenging -sent caregiver support resources by email to Nicholas Burgess to help plan for the future--this includes home care agencies as well as day programs and websites with educational info -I will follow patient over time and help provide education about dementia progression  2. Dependent for meal preparation -will ask our social worker to reach out  to [place] about meals-on-wheels or other options (?mom's meals) to help with this concern  3. Palliative care encounter -as above, pt to completed LW/HCPOA and get notarized then we can scan to chart -I will work with them on DNR/MOST at in-person visit -pt would prefer to remain at home for care if at all possible   Follow up Palliative Care Visit: Palliative care will continue to follow for complex medical decision making, advance care planning, and  clarification of goals. Return 4-6 weeks or prn.   This visit was coded based on medical decision making (MDM).  22 minutes spent on ACP.  PPS: 50%  HOSPICE ELIGIBILITY/DIAGNOSIS:  Not at this time/dementia  Chief Complaint:   initial palliative consult  HISTORY OF PRESENT ILLNESS:  Nicholas Burgess is a 81 y.o. year old male  with htn, hyperlipidemia, syncope, colon polyps, erectile dysfunction, diverticulosis, urinary retention, and moderate dementia.  He lives at home with his wife who also has memory loss.  Their son Nicholas Burgess helps them.    The biggest issue is his short-term memory loss.  Nicholas Burgess reports it would be helpful to have regular check-ins going forward to see what is needed.  He is the only one involved in his parents' care (local).  He does the pill trays each Sunday morning and makes sure they take their pills.  Both continue to bathe and dress themselves, feed themselves.  He remains continent of bowel and bladder.  He had an episode or retention of urine, but it resolved without further issues.  They are having some challenges with meals and they eat about 2x per day typically.  Nicholas Burgess lives down the road but travels quite a bit for work.  Pt ambulates without assistive device.    History obtained from review of EMR, discussion with primary team, and interview with family, facility staff/caregiver and/or Mr. Wainright.  I reviewed available labs, medications, imaging, studies and related documents from the EMR.  Records reviewed and summarized above.   ROS General: NAD EYES: denies vision changes ENMT: denies dysphagia Cardiovascular: denies chest pain, denies DOE Pulmonary: denies cough, denies increased SOB Abdomen: endorses good appetite, denies constipation, endorses continence of bowel GU: denies dysuria, endorses continence of urine MSK:  denies increased weakness,  no falls reported Skin: denies rashes or wounds Neurological: denies pain, denies insomnia, has short-term  memory loss but little insight into this, provides incorrect answers that his son corrects to me Psych: Endorses positive mood Heme/lymph/immuno: denies bruises, abnormal bleeding  Physical Exam: Could not perform due to video visit portion not working.  CURRENT PROBLEM LIST:  Patient Active Problem List   Diagnosis Date Noted   MCI (mild cognitive impairment) 10/17/2019   Right shoulder pain 03/01/2017   Seborrheic keratosis 08/26/2016   Follow-up -------- PCP NOTES 03/18/2015   Hematospermia 12/13/2014   Annual physical exam 09/14/2013   LOW BACK PAIN SYNDROME 01/19/2010   Hyperlipidemia 04/15/2009   COLONIC POLYPS, HX OF 04/15/2009   ARTHRALGIA 11/04/2008   Essential hypertension 04/04/2008   Hyperglycemia 04/04/2008   DIVERTICULITIS, HX OF 04/04/2008   CALCANEAL SPUR 06/12/2007   PAST MEDICAL HISTORY:  Active Ambulatory Problems    Diagnosis Date Noted   Hyperlipidemia 04/15/2009   Essential hypertension 04/04/2008   ARTHRALGIA 11/04/2008   LOW BACK PAIN SYNDROME 01/19/2010   CALCANEAL SPUR 06/12/2007   Hyperglycemia 04/04/2008   COLONIC POLYPS, HX OF 04/15/2009   DIVERTICULITIS, HX OF 04/04/2008   Annual physical exam 09/14/2013   Hematospermia  12/13/2014   Follow-up -------- PCP NOTES 03/18/2015   Seborrheic keratosis 08/26/2016   Right shoulder pain 03/01/2017   MCI (mild cognitive impairment) 10/17/2019   Resolved Ambulatory Problems    Diagnosis Date Noted   HYPOKALEMIA 01/30/2010   UNS ADVRS EFF UNS RX MEDICINAL&BIOLOGICAL SBSTNC 10/30/2007   CERUMEN IMPACTION, BILATERAL 07/22/2010   HEARING LOSS, UNSPEC. 07/22/2010   SINUSITIS- ACUTE-NOS 07/22/2010   Vasculitis ([place]) 03/19/2013   Past Medical History:  Diagnosis Date   Diverticulosis of colon    Erectile dysfunction due to arterial insufficiency    Hx of colonic polyps 2007   Hypertension    Syncope 2002   SOCIAL HX:  Social History   Tobacco Use   Smoking status: Former    Types: Cigarettes     Quit date: 07/12/1980    Years since quitting: 41.1   Smokeless tobacco: Never   Tobacco comments:    smoked 1959-1982, up to 1/2 ppd  Substance Use Topics   Alcohol use: Yes    Comment:  socially   FAMILY HX:  Family History  Problem Relation Age of Onset   Heart failure Father    Hypertension Father    Hypertension Mother    Alzheimer's disease Mother    Cancer Mother        GYN   Diverticulitis Brother    Diabetes Sister    Heart attack Brother 43   Colon cancer Neg Hx    Stomach cancer Neg Hx    Rectal cancer Neg Hx    Stroke Neg Hx    Prostate cancer Neg Hx       ALLERGIES: No Known Allergies   PERTINENT MEDICATIONS:  Outpatient Encounter Medications as of 08/10/2021  Medication Sig   acetaminophen (TYLENOL) 500 MG tablet Take 500 mg by mouth every 6 (six) hours as needed for pain. For headache pain (Patient not taking: Reported on 07/21/2021)   atorvastatin (LIPITOR) 40 MG tablet Take 1 tablet (40 mg total) by mouth at bedtime.   donepezil (ARICEPT) 10 MG tablet Take 1 tablet (10 mg total) by mouth at bedtime.   metoprolol succinate (TOPROL-XL) 100 MG 24 hr tablet Take 1.5 tablets (150 mg total) by mouth daily.   Multiple Vitamin (MULTIVITAMIN) tablet Take 1 tablet by mouth daily. (Patient not taking: Reported on 07/21/2021)   Omega-3 Fatty Acids (FISH OIL) 1000 MG CAPS Take 1,000 mg by mouth daily. (Patient not taking: Reported on 07/21/2021)   PROLENSA 0.07 % SOLN Take as directed (Patient not taking: Reported on 07/21/2021)   No facility-administered encounter medications on file as of 08/10/2021.   Thank you for the opportunity to participate in the care of Mr. Soileau.  The palliative care team will continue to follow. Please call our office at [phone] if we can be of additional assistance.   Hollace Kinnier, DO   COVID-19 PATIENT SCREENING TOOL Asked and negative response unless otherwise noted:  Have you had symptoms of covid, tested positive or been in  contact with someone with symptoms/positive test in the past 5-10 days? no

## 2021-08-26 ENCOUNTER — Ambulatory Visit: Payer: Medicare Other | Admitting: Internal Medicine

## 2021-08-26 ENCOUNTER — Ambulatory Visit (INDEPENDENT_AMBULATORY_CARE_PROVIDER_SITE_OTHER): Payer: Medicare Other | Admitting: Internal Medicine

## 2021-08-26 VITALS — BP 118/78 | HR 63 | Temp 97.7°F | Resp 16 | Ht 75.0 in | Wt 251.0 lb

## 2021-08-26 DIAGNOSIS — L821 Other seborrheic keratosis: Secondary | ICD-10-CM | POA: Diagnosis not present

## 2021-08-26 DIAGNOSIS — E785 Hyperlipidemia, unspecified: Secondary | ICD-10-CM | POA: Diagnosis not present

## 2021-08-26 DIAGNOSIS — F03B0 Unspecified dementia, moderate, without behavioral disturbance, psychotic disturbance, mood disturbance, and anxiety: Secondary | ICD-10-CM | POA: Diagnosis not present

## 2021-08-26 DIAGNOSIS — I10 Essential (primary) hypertension: Secondary | ICD-10-CM | POA: Diagnosis not present

## 2021-08-26 DIAGNOSIS — R739 Hyperglycemia, unspecified: Secondary | ICD-10-CM

## 2021-08-26 LAB — COMPREHENSIVE METABOLIC PANEL
ALT: 25 U/L (ref 0–53)
AST: 20 U/L (ref 0–37)
Albumin: 4.2 g/dL (ref 3.5–5.2)
Alkaline Phosphatase: 103 U/L (ref 39–117)
BUN: 22 mg/dL (ref 6–23)
CO2: 30 mEq/L (ref 19–32)
Calcium: 9.6 mg/dL (ref 8.4–10.5)
Chloride: 102 mEq/L (ref 96–112)
Creatinine, Ser: 0.97 mg/dL (ref 0.40–1.50)
GFR: 74.38 mL/min (ref >60.00)
Glucose, Bld: 111 mg/dL — ABNORMAL HIGH (ref 70–99)
Potassium: 4.6 mEq/L (ref 3.5–5.1)
Sodium: 138 mEq/L (ref 135–145)
Total Bilirubin: 1 mg/dL (ref 0.2–1.2)
Total Protein: 7.4 g/dL (ref 6.0–8.3)

## 2021-08-26 LAB — B12 AND FOLATE PANEL
Folate: 11.9 ng/mL (ref >5.9)
Vitamin B-12: 190 pg/mL — ABNORMAL LOW (ref 211–911)

## 2021-08-26 LAB — LIPID PANEL
Cholesterol: 133 mg/dL (ref 0–200)
HDL: 36.6 mg/dL — ABNORMAL LOW (ref >39.00)
LDL Cholesterol: 68 mg/dL (ref 0–99)
NonHDL: 96.74
Total CHOL/HDL Ratio: 4
Triglycerides: 144 mg/dL (ref 0.0–149.0)
VLDL: 28.8 mg/dL (ref 0.0–40.0)

## 2021-08-26 LAB — HEMOGLOBIN A1C: Hgb A1c MFr Bld: 6.1 % (ref 4.6–6.5)

## 2021-08-26 LAB — TSH: TSH: 3.54 u[IU]/mL (ref 0.35–5.50)

## 2021-08-26 MED ORDER — MEMANTINE HCL 5 MG PO TABS: ORAL_TABLET | ORAL | 6 refills | Status: DC

## 2021-08-26 NOTE — Progress Notes (Signed)
Subjective:    Patient ID: Nicholas Burgess, male    DOB: 1940-09-06, 81 y.o.   MRN: [id number]  DOS:  08/26/2021 Type of visit - description: Follow-up, here with his wife and son  Since the last visit things are quiet. Good ambulatory BPs Good medication compliance Dementia however is not any better, perhaps worse per family. No behavioral issues such as aggressiveness or wandering. Palliative care is on board  Review of Systems See above   Past Medical History:  Diagnosis Date   Diverticulosis of colon    Erectile dysfunction due to arterial insufficiency    Hematospermia    Hx of colonic polyps 2007   Hyperlipidemia    Hypertension    Syncope 2002    Past Surgical History:  Procedure Laterality Date   APPENDECTOMY  1992   CATARACT EXTRACTION Bilateral 06/13/2017   COLONOSCOPY W/ POLYPECTOMY  2007 & 2012   Dr.Perry ; hyperplastic   INGUINAL HERNIA REPAIR  2009   bilateral   MANDIBLE FRACTURE SURGERY  1964    Current Outpatient Medications  Medication Instructions   acetaminophen (TYLENOL) 500 mg, Oral, Every 6 hours PRN, For headache pain    aspirin 81 MG EC tablet 1 tablet   atorvastatin (LIPITOR) 40 mg, Oral, Daily at bedtime   donepezil (ARICEPT) 10 mg, Oral, Daily at bedtime   Fish Oil 1,000 mg, Oral, Daily,     metoprolol succinate (TOPROL-XL) 150 mg, Oral, Daily   Multiple Vitamin (MULTIVITAMIN) tablet 1 tablet, Oral, Daily,     PROLENSA 0.07 % SOLN Take as directed   tamsulosin (FLOMAX) 0.4 mg, Oral, Daily       Objective:   Physical Exam BP 118/78 (BP Location: Left Arm, Patient Position: Sitting, Cuff Size: Small)    Pulse 63    Temp 97.7 F (36.5 C) (Oral)    Resp 16    Ht 6\' 3"  (1.905 m)    Wt 251 lb (113.9 kg)    SpO2 95%    BMI 31.37 kg/m  General:   Well developed, NAD, BMI noted. HEENT:  Normocephalic . Face symmetric, atraumatic Skin: Multiple skin lesions, they are once a day ears look like SKs.   Neurologic:  alert, pleasant,  cooperative.  MMSE today: 20 Speech normal, gait appropriate for age and unassisted Psych-Behavior appropriate. No anxious or depressed appearing.      Assessment    Assessment  Prediabetes HTN dc amlodipine 05-2015 dt edema Leg edema, worse on the left, neg [place] for  DVT 01/2018 Hyperlipidemia Hematospermia 1 ( 12-2014) -- saw urology 09-2015, RTC prn Syncope 2002 Skin cancer  MCI MMSE 21 (10-2019), rx aricept    PLAN Compliance: Good compliance of medication Prediabetes: Check A1c HTN: Family reports good ambulatory BPs, BP today is very good.  Currently on metoprolol.  Check CMP and CBC Hyperlipidemia: On Lipitor, check FLP Dementia: Worse per family, currently on Aricept 10 mg. Patient is not driving. MMSE today: 20, not much worse than before. No behavioral issues, palliative care on board, they are planning to help with meals etc. We talk about possible imaging to rule out   etiology of dementia, noting that this would be a probably low yield, both the wife and son declined  for now. Plan: TSH, L24, folic acid to rule out treatable causes of dementia.   ADD Namenda.  Reassess in 2 months. SK: Multiple skin lesions, they once I was able to see at the neck and ears seem classic  for SK.  Offered Derm referral, declined it.  Agreed to monitor the areas, red flags discussed. RTC 2 months.  Time spent 45 minutes: Most of the time spent listening to family concerns, performing detailed mental exam, explaining pros cons of brain imaging.   This visit occurred during the SARS-CoV-2 public health emergency.  Safety protocols were in place, including screening questions prior to the visit, additional usage of staff PPE, and extensive cleaning of exam room while observing appropriate contact time as indicated for disinfecting solutions.

## 2021-08-26 NOTE — Patient Instructions (Signed)
Add a medication called Namenda 5 mg: 1 tablet a day the first week (day or night) Then 1 tablet twice daily     GO TO THE LAB : Get the blood work     [place], [place] back for   a checkup in 2 months

## 2021-08-27 NOTE — Assessment & Plan Note (Signed)
Compliance: Good compliance of medication Prediabetes: Check A1c HTN: Family reports good ambulatory BPs, BP today is very good.  Currently on metoprolol.  Check CMP and CBC Hyperlipidemia: On Lipitor, check FLP Dementia: Worse per family, currently on Aricept 10 mg. Patient is not driving. MMSE today: 20, not much worse than before. No behavioral issues, palliative care on board, they are planning to help with meals etc. We talk about possible imaging to rule out   etiology of dementia, noting that this would be a probably low yield, both the wife and son declined  for now. Plan: TSH, F35, folic acid to rule out treatable causes of dementia.   ADD Namenda.  Reassess in 2 months. SK: Multiple skin lesions, they once I was able to see at the neck and ears seem classic for SK.  Offered Derm referral, declined it.  Agreed to monitor the areas, red flags discussed. RTC 2 months.

## 2021-08-31 ENCOUNTER — Encounter (HOSPITAL_COMMUNITY): Payer: Self-pay | Admitting: Emergency Medicine

## 2021-08-31 ENCOUNTER — Emergency Department (HOSPITAL_COMMUNITY)
Admission: EM | Admit: 2021-08-31 | Discharge: 2021-09-01 | Disposition: A | Discharge status: Home / Self Care | Payer: Medicare Other | Attending: Emergency Medicine | Admitting: Emergency Medicine

## 2021-08-31 ENCOUNTER — Other Ambulatory Visit: Payer: Self-pay

## 2021-08-31 DIAGNOSIS — I10 Essential (primary) hypertension: Secondary | ICD-10-CM | POA: Insufficient documentation

## 2021-08-31 DIAGNOSIS — N3001 Acute cystitis with hematuria: Secondary | ICD-10-CM | POA: Diagnosis not present

## 2021-08-31 DIAGNOSIS — F0390 Unspecified dementia without behavioral disturbance: Secondary | ICD-10-CM | POA: Insufficient documentation

## 2021-08-31 DIAGNOSIS — Z87891 Personal history of nicotine dependence: Secondary | ICD-10-CM | POA: Diagnosis not present

## 2021-08-31 DIAGNOSIS — R319 Hematuria, unspecified: Secondary | ICD-10-CM | POA: Diagnosis present

## 2021-08-31 LAB — URINALYSIS, ROUTINE W REFLEX MICROSCOPIC
RBC / HPF: >50 RBC/hpf — ABNORMAL HIGH (ref 0–5)
WBC, UA: >50 WBC/hpf — ABNORMAL HIGH (ref 0–5)

## 2021-08-31 LAB — COMPREHENSIVE METABOLIC PANEL
ALT: 28 U/L (ref 0–44)
AST: 24 U/L (ref 15–41)
Albumin: 3.7 g/dL (ref 3.5–5.0)
Alkaline Phosphatase: 95 U/L (ref 38–126)
Anion gap: 10 (ref 5–15)
BUN: 20 mg/dL (ref 8–23)
CO2: 23 mmol/L (ref 22–32)
Calcium: 9.3 mg/dL (ref 8.9–10.3)
Chloride: 102 mmol/L (ref 98–111)
Creatinine, Ser: 0.96 mg/dL (ref 0.61–1.24)
GFR, Estimated: >60 mL/min (ref >60)
Glucose, Bld: 122 mg/dL — ABNORMAL HIGH (ref 70–99)
Potassium: 4.2 mmol/L (ref 3.5–5.1)
Sodium: 135 mmol/L (ref 135–145)
Total Bilirubin: 0.9 mg/dL (ref 0.3–1.2)
Total Protein: 7 g/dL (ref 6.5–8.1)

## 2021-08-31 LAB — CBC
HCT: 45.5 % (ref 39.0–52.0)
Hemoglobin: 15.6 g/dL (ref 13.0–17.0)
MCH: 31 pg (ref 26.0–34.0)
MCHC: 34.3 g/dL (ref 30.0–36.0)
MCV: 90.5 fL (ref 80.0–100.0)
Platelets: 179 10*3/uL (ref 150–400)
RBC: 5.03 MIL/uL (ref 4.22–5.81)
RDW: 13.5 % (ref 11.5–15.5)
WBC: 9.8 10*3/uL (ref 4.0–10.5)
nRBC: 0 % (ref 0.0–0.2)

## 2021-08-31 NOTE — ED Triage Notes (Signed)
Patient from home, with hematuria.  Patient has been urinating frank blood since this morning.  Patient has a history of foley about one month ago.  Patient had retention.  No pain with urination.

## 2021-08-31 NOTE — ED Provider Triage Note (Signed)
Emergency Medicine Provider Triage Evaluation Note  Nicholas Burgess , a 81 y.o. male  was evaluated in triage.  Pt complains of hematuria starting today.  Patient was seen about a month ago and had a Foley catheter placed.  He had this in for about a week and then it was removed.  He had no complications leading up until today.  He has blood throughout his stream.  Review of Systems  Positive: Hematuria Negative: Abdominal pain, flank pain, penile pain, dysuria  Physical Exam  BP 131/90 (BP Location: Left Arm)    Pulse 74    Temp 98.6 F (37 C)    Resp 20    SpO2 93%  Gen:   Awake, no distress   Resp:  Normal effort  MSK:   Moves extremities without difficulty  Other:  Dried blood on patient's pants  Medical Decision Making  Medically screening exam initiated at 7:59 PM.  Appropriate orders placed.  JAIMESON GOPAL was informed that the remainder of the evaluation will be completed by another provider, this initial triage assessment does not replace that evaluation, and the importance of remaining in the ED until their evaluation is complete.     Kateri Plummer, PA-C 08/31/21 1959

## 2021-09-01 ENCOUNTER — Ambulatory Visit: Payer: Medicare Other | Admitting: Internal Medicine

## 2021-09-01 MED ORDER — CIPROFLOXACIN HCL 500 MG PO TABS: 500.0000 mg | ORAL_TABLET | Freq: Two times a day (BID) | ORAL | 0 refills | Status: AC

## 2021-09-01 MED ORDER — CIPROFLOXACIN HCL 500 MG PO TABS
500.0000 mg | ORAL_TABLET | Freq: Once | ORAL | Status: AC
Start: 2021-09-01 — End: 2021-09-01
  Administered 2021-09-01: 500 mg via ORAL
  Filled 2021-09-01: qty 1

## 2021-09-01 NOTE — ED Provider Notes (Signed)
[facility] Emergency Department Provider Note MRN:  [id number]  Arrival date & time: 09/01/21     Chief Complaint   Hematuria   History of Present Illness   Nicholas Burgess is a 81 y.o. year-old male with a history of hypertension, dementia presenting to the ED with chief complaint of hematuria.  Blood in urine all day today.  No pain to the abdomen, no burning with urination, no fever.  Had a catheter in place last month.  Review of Systems  A thorough review of systems was obtained and all systems are negative except as noted in the HPI and PMH.   Patient's Health History    Past Medical History:  Diagnosis Date   Diverticulosis of colon    Erectile dysfunction due to arterial insufficiency    Hematospermia    Hx of colonic polyps 2007   Hyperlipidemia    Hypertension    Syncope 2002    Past Surgical History:  Procedure Laterality Date   APPENDECTOMY  1992   CATARACT EXTRACTION Bilateral 06/13/2017   COLONOSCOPY W/ POLYPECTOMY  2007 & 2012   Dr.Perry ; hyperplastic   INGUINAL HERNIA REPAIR  2009   bilateral   MANDIBLE FRACTURE SURGERY  1964    Family History  Problem Relation Age of Onset   Heart failure Father    Hypertension Father    Hypertension Mother    Alzheimer's disease Mother    Cancer Mother        GYN   Diverticulitis Brother    Diabetes Sister    Heart attack Brother 48   Colon cancer Neg Hx    Stomach cancer Neg Hx    Rectal cancer Neg Hx    Stroke Neg Hx    Prostate cancer Neg Hx     Social History   Socioeconomic History   Marital status: Married    Spouse name: Not on file   Number of children: 2   Years of education: Not on file   Highest education level: Not on file  Occupational History   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: [employer]  Tobacco Use   Smoking status: Former    Types: Cigarettes    Quit date: 07/12/1980    Years since quitting: 41.1   Smokeless tobacco: Never    Tobacco comments:    smoked 1959-1982, up to 1/2 ppd  Substance and Sexual Activity   Alcohol use: Yes    Comment:  socially   Drug use: No   Sexual activity: Not on file  Other Topics Concern   Not on file  Social History Narrative   Household pt and wife; she has dementia   One child in [place], another in [place] Strain: Low Risk    Difficulty of Paying Living Expenses: Not hard at all  Food Insecurity: No Food Insecurity   Worried About Charity fundraiser in the Last Year: Never true   Arboriculturist in the Last Year: Never true  Transportation Needs: No Transportation Needs   Lack of Transportation (Medical): No   Lack of Transportation (Non-Medical): No  Physical Activity: Inactive   Days of Exercise per Week: 0 days   Minutes of Exercise per Session: 0 min  Stress: No Stress Concern Present   Feeling of Stress : Not at all  Social Connections: Moderately Integrated   Frequency of Communication with Friends and  Family: More than three times a week   Frequency of Social Gatherings with Friends and Family: More than three times a week   Attends Religious Services: More than 4 times per year   Active Member of Clubs or Organizations: No   Attends Archivist Meetings: Never   Marital Status: Married  Human resources officer Violence: Not At Risk   Fear of Current or Ex-Partner: No   Emotionally Abused: No   Physically Abused: No   Sexually Abused: No     Physical Exam   Vitals:   09/01/21 0117 09/01/21 0235  BP: (!) 153/103 (!) 149/96  Pulse: 82 78  Resp: 20 18  Temp:    SpO2: 96% 98%    CONSTITUTIONAL: Well-appearing, NAD NEURO/PSYCH:  Alert and oriented x 3, no focal deficits EYES:  eyes equal and reactive ENT/NECK:  no LAD, no JVD CARDIO: Regular rate, well-perfused, normal S1 and S2 PULM:  CTAB no wheezing or rhonchi GI/GU:  non-distended, non-tender MSK/SPINE:  No gross deformities, no  edema SKIN:  no rash, atraumatic   *Additional and/or pertinent findings included in MDM below  Diagnostic and Interventional Summary    EKG Interpretation  Date/Time:    Ventricular Rate:    PR Interval:    QRS Duration:   QT Interval:    QTC Calculation:   R Axis:     Text Interpretation:         Labs Reviewed  COMPREHENSIVE METABOLIC PANEL - Abnormal; Notable for the following components:      Result Value   Glucose, Bld 122 (*)    All other components within normal limits  URINALYSIS, ROUTINE W REFLEX MICROSCOPIC - Abnormal; Notable for the following components:   Color, Urine RED (*)    APPearance TURBID (*)    Glucose, UA   (*)    Value: TEST NOT REPORTED DUE TO COLOR INTERFERENCE OF URINE PIGMENT   Hgb urine dipstick   (*)    Value: TEST NOT REPORTED DUE TO COLOR INTERFERENCE OF URINE PIGMENT   Bilirubin Urine   (*)    Value: TEST NOT REPORTED DUE TO COLOR INTERFERENCE OF URINE PIGMENT   Ketones, ur   (*)    Value: TEST NOT REPORTED DUE TO COLOR INTERFERENCE OF URINE PIGMENT   Protein, ur   (*)    Value: TEST NOT REPORTED DUE TO COLOR INTERFERENCE OF URINE PIGMENT   Nitrite   (*)    Value: TEST NOT REPORTED DUE TO COLOR INTERFERENCE OF URINE PIGMENT   Leukocytes,Ua   (*)    Value: TEST NOT REPORTED DUE TO COLOR INTERFERENCE OF URINE PIGMENT   RBC / HPF >50 (*)    WBC, UA >50 (*)    Bacteria, UA RARE (*)    All other components within normal limits  URINE CULTURE  CBC    No orders to display    Medications  ciprofloxacin (CIPRO) tablet 500 mg (has no administration in time range)     Procedures  /  Critical Care Procedures  ED Course and Medical Decision Making  Initial Impression and Ddx Hematuria, DDx includes renal or bladder neoplasm, UTI, less likely a glomerulonephritis.  Labs are reassuring with no significant blood loss, no electrolyte disturbance.  Urinalysis with white blood cells and bacteria along with the red blood cells to suggest  possible or likely infection.  Will treat with antibiotics, given he is male and had a recent catheter, will need coverage with ciprofloxacin.  Past medical/surgical history that increases complexity of ED encounter: None  Interpretation of Diagnostics see above for interpretation  Patient Reassessment and Ultimate Disposition/Management Discharge home  Patient management required discussion with the following services or consulting groups:  None  Complexity of Problems Addressed Acute complicated illness or Injury  Additional Data Reviewed and Analyzed Further history obtained from: Further history from spouse/family member  Additional Factors Impacting ED Encounter Risk Prescriptions  Barth Kirks. [place] Small, [place] [email or web address]  Final Clinical Impressions(s) / ED Diagnoses     ICD-10-CM   1. Acute cystitis with hematuria  N30.01       ED Discharge Orders          Ordered    ciprofloxacin (CIPRO) 500 MG tablet  Every 12 hours        09/01/21 0305             Discharge Instructions Discussed with and Provided to Patient:    Discharge Instructions      You were evaluated in the Emergency Department and after careful evaluation, we did not find any emergent condition requiring admission or further testing in the hospital.  Your exam/testing today was overall reassuring.  Symptoms likely due to a urinary tract infection.  Please take the ciprofloxacin antibiotic as directed.  Recommend follow-up with [facility] if your bleeding does not resolve in 2 weeks.  Please return to the Emergency Department if you experience any worsening of your condition.  Thank you for allowing [place] to be a part of your care.       Maudie Flakes, MD 09/01/21 [phone]

## 2021-09-01 NOTE — Discharge Instructions (Signed)
You were evaluated in the Emergency Department and after careful evaluation, we did not find any emergent condition requiring admission or further testing in the hospital.  Your exam/testing today was overall reassuring.  Symptoms likely due to a urinary tract infection.  Please take the ciprofloxacin antibiotic as directed.  Recommend follow-up with [facility] if your bleeding does not resolve in 2 weeks.  Please return to the Emergency Department if you experience any worsening of your condition.  Thank you for allowing [place] to be a part of your care.

## 2021-09-03 DIAGNOSIS — R338 Other retention of urine: Secondary | ICD-10-CM | POA: Diagnosis not present

## 2021-09-03 DIAGNOSIS — R310 Gross hematuria: Secondary | ICD-10-CM | POA: Diagnosis not present

## 2021-09-03 LAB — URINE CULTURE: Culture: 80000 — AB

## 2021-09-04 ENCOUNTER — Telehealth: Payer: Self-pay | Admitting: Emergency Medicine

## 2021-09-04 NOTE — Telephone Encounter (Signed)
Post ED Visit - Positive Culture Follow-up  Culture report reviewed by antimicrobial stewardship pharmacist: [place] Team []  Elenor Quinones, Pharm.D. []  Heide Guile, Pharm.D., BCPS AQ-ID []  Parks Neptune, Pharm.D., BCPS []  Alycia Rossetti, Pharm.D., BCPS []  [place], Pharm.D., BCPS, AAHIVP []  Legrand Como, Pharm.D., BCPS, AAHIVP []  Salome Arnt, PharmD, BCPS []  Johnnette Gourd, PharmD, BCPS []  Hughes Better, PharmD, BCPS []  Leeroy Cha, PharmD []  Laqueta Linden, PharmD, BCPS []  Albertina Parr, PharmD  [place] Team []  Leodis Sias, PharmD []  Lindell Spar, PharmD []  Royetta Asal, PharmD []  Graylin Shiver, Rph []  Rema Fendt) Glennon Mac, PharmD []  Arlyn Dunning, PharmD []  Netta Cedars, PharmD []  Dia Sitter, PharmD []  Leone Haven, PharmD []  Gretta Arab, PharmD []  Theodis Shove, PharmD []  Peggyann Juba, PharmD []  Reuel Boom, PharmD   Positive urine culture Treated with ciprofloxacin, organism sensitive to the same and no further patient follow-up is required at this time.  Hazle Nordmann 09/04/2021, 10:31 AM

## 2021-09-07 ENCOUNTER — Encounter (HOSPITAL_BASED_OUTPATIENT_CLINIC_OR_DEPARTMENT_OTHER): Payer: Self-pay | Admitting: *Deleted

## 2021-09-07 ENCOUNTER — Emergency Department (HOSPITAL_BASED_OUTPATIENT_CLINIC_OR_DEPARTMENT_OTHER)
Admission: EM | Admit: 2021-09-07 | Discharge: 2021-09-07 | Disposition: A | Discharge status: Home / Self Care | Payer: Medicare Other | Attending: Emergency Medicine | Admitting: Emergency Medicine

## 2021-09-07 ENCOUNTER — Other Ambulatory Visit: Payer: Self-pay

## 2021-09-07 DIAGNOSIS — N3001 Acute cystitis with hematuria: Secondary | ICD-10-CM | POA: Diagnosis not present

## 2021-09-07 DIAGNOSIS — I10 Essential (primary) hypertension: Secondary | ICD-10-CM | POA: Diagnosis not present

## 2021-09-07 DIAGNOSIS — R310 Gross hematuria: Secondary | ICD-10-CM | POA: Diagnosis not present

## 2021-09-07 DIAGNOSIS — N3091 Cystitis, unspecified with hematuria: Secondary | ICD-10-CM | POA: Insufficient documentation

## 2021-09-07 DIAGNOSIS — Z79899 Other long term (current) drug therapy: Secondary | ICD-10-CM | POA: Insufficient documentation

## 2021-09-07 LAB — CBC WITH DIFFERENTIAL/PLATELET
Abs Immature Granulocytes: 0.02 10*3/uL (ref 0.00–0.07)
Basophils Absolute: 0 10*3/uL (ref 0.0–0.1)
Basophils Relative: 0 %
Eosinophils Absolute: 0.1 10*3/uL (ref 0.0–0.5)
Eosinophils Relative: 1 %
HCT: 40.8 % (ref 39.0–52.0)
Hemoglobin: 14.1 g/dL (ref 13.0–17.0)
Immature Granulocytes: 0 %
Lymphocytes Relative: 21 %
Lymphs Abs: 1.2 10*3/uL (ref 0.7–4.0)
MCH: 31.1 pg (ref 26.0–34.0)
MCHC: 34.6 g/dL (ref 30.0–36.0)
MCV: 90.1 fL (ref 80.0–100.0)
Monocytes Absolute: 0.5 10*3/uL (ref 0.1–1.0)
Monocytes Relative: 8 %
Neutro Abs: 3.8 10*3/uL (ref 1.7–7.7)
Neutrophils Relative %: 70 %
Platelets: 177 10*3/uL (ref 150–400)
RBC: 4.53 MIL/uL (ref 4.22–5.81)
RDW: 13.7 % (ref 11.5–15.5)
WBC: 5.6 10*3/uL (ref 4.0–10.5)
nRBC: 0 % (ref 0.0–0.2)

## 2021-09-07 LAB — BASIC METABOLIC PANEL
Anion gap: 7 (ref 5–15)
BUN: 17 mg/dL (ref 8–23)
CO2: 25 mmol/L (ref 22–32)
Calcium: 8.6 mg/dL — ABNORMAL LOW (ref 8.9–10.3)
Chloride: 104 mmol/L (ref 98–111)
Creatinine, Ser: 0.89 mg/dL (ref 0.61–1.24)
GFR, Estimated: >60 mL/min (ref >60)
Glucose, Bld: 110 mg/dL — ABNORMAL HIGH (ref 70–99)
Potassium: 3.8 mmol/L (ref 3.5–5.1)
Sodium: 136 mmol/L (ref 135–145)

## 2021-09-07 LAB — URINALYSIS, ROUTINE W REFLEX MICROSCOPIC

## 2021-09-07 LAB — URINALYSIS, MICROSCOPIC (REFLEX): RBC / HPF: >50 RBC/hpf (ref 0–5)

## 2021-09-07 LAB — PSA: Prostatic Specific Antigen: 5.48 ng/mL — ABNORMAL HIGH (ref 0.00–4.00)

## 2021-09-07 MED ORDER — CIPROFLOXACIN HCL 500 MG PO TABS: 500.0000 mg | ORAL_TABLET | Freq: Two times a day (BID) | ORAL | 0 refills | Status: DC

## 2021-09-07 MED ORDER — FINASTERIDE 5 MG PO TABS: 5.0000 mg | ORAL_TABLET | Freq: Every day | ORAL | 0 refills | Status: AC

## 2021-09-07 NOTE — Discharge Instructions (Signed)
You are seen in the ER for bloody urine.  Fortunately, you are able to pass your urine without difficulty and there is no evidence of large clots coming out.  Your urine test here is revealing persistent bacteria.  We discussed your case with Dr. Jeffie Pollock, urology -he recommends that you start taking finasteride 5 mg tablet daily and also after the completion of your antibiotics that were already prescribed, take 3 more days worth of antibiotics.   Both finasteride and the additional antibiotics have been prescribed today.  Return to the ER if you start having difficulty in voiding, severe pain, urinary retention, high fevers. Expect a call from [facility] within expedited follow-up.  If you do not hear from them by tomorrow afternoon, call the number provided.

## 2021-09-07 NOTE — ED Provider Notes (Signed)
[name] EMERGENCY DEPARTMENT Provider Note   CSN: [id number] Arrival date & time: 09/07/21  1032     History  Chief Complaint  Patient presents with   Hematuria    Nicholas Burgess is a 81 y.o. male.  HPI     81 year old male comes in with chief complaint of bloody urine. Patient has history of hypertension, hyperlipidemia, likely enlarged prostate.  About a month ago he had urinary retention for which she required Foley catheter for a week.  Thereafter, he had an episode of hematuria for which he was treated with antibiotics.  More recently, he had hematuria again and was started on ciprofloxacin on 221.  It seems like his symptoms had improved until yesterday when he started having gross hematuria again.  He is not passing any clots.  He is not on any blood thinners.  He denies any fevers or chills.  Home Medications Prior to Admission medications   Medication Sig Start Date End Date Taking? Authorizing Provider  ciprofloxacin (CIPRO) 500 MG tablet Take 1 tablet (500 mg total) by mouth every 12 (twelve) hours. 09/08/21  Yes Varney Biles, MD  finasteride (PROSCAR) 5 MG tablet Take 1 tablet (5 mg total) by mouth daily. 09/07/21  Yes Varney Biles, MD  acetaminophen (TYLENOL) 500 MG tablet Take 500 mg by mouth every 6 (six) hours as needed for pain. For headache pain    [provider]  atorvastatin (LIPITOR) 40 MG tablet Take 1 tablet (40 mg total) by mouth at bedtime. 07/21/21   Colon Branch, MD  ciprofloxacin (CIPRO) 500 MG tablet Take 1 tablet (500 mg total) by mouth every 12 (twelve) hours for 7 days. 09/01/21 09/08/21  Maudie Flakes, MD  diphenhydrAMINE (BENADRYL) 25 MG tablet Take 25 mg by mouth every 6 (six) hours as needed for allergies.    [provider]  donepezil (ARICEPT) 10 MG tablet Take 1 tablet (10 mg total) by mouth at bedtime. 07/21/21   Colon Branch, MD  memantine (NAMENDA) 5 MG tablet 1 tablet daily for 1 week, then 1 tablet  twice daily Patient taking differently: Take 5 mg by mouth See admin instructions. 1 tablet daily for 1 week, then 1 tablet twice daily 08/26/21   Colon Branch, MD  metoprolol succinate (TOPROL-XL) 100 MG 24 hr tablet Take 1.5 tablets (150 mg total) by mouth daily. 07/21/21   Colon Branch, MD  tamsulosin (FLOMAX) 0.4 MG CAPS capsule Take 0.4 mg by mouth at bedtime. 07/21/21   [provider]      Allergies    Patient has no known allergies.    Review of Systems   Review of Systems  All other systems reviewed and are negative.  Physical Exam Updated Vital Signs BP 134/88    Pulse (!) 53    Temp 97.6 F (36.4 C) (Oral)    Resp 18    Ht 6\' 2"  (1.88 m)    Wt 108.9 kg    SpO2 95%    BMI 30.81 kg/m  Physical Exam Vitals and nursing note reviewed.  Constitutional:      Appearance: He is well-developed.  HENT:     Head: Atraumatic.  Cardiovascular:     Rate and Rhythm: Normal rate.  Pulmonary:     Effort: Pulmonary effort is normal.  Musculoskeletal:     Cervical back: Neck supple.  Skin:    General: Skin is warm.  Neurological:     Mental Status: He  is alert and oriented to person, place, and time.    ED Results / Procedures / Treatments   Labs (all labs ordered are listed, but only abnormal results are displayed) Labs Reviewed  URINALYSIS, ROUTINE W REFLEX MICROSCOPIC - Abnormal; Notable for the following components:      Result Value   Color, Urine RED (*)    APPearance TURBID (*)    Glucose, UA   (*)    Value: TEST NOT REPORTED DUE TO COLOR INTERFERENCE OF URINE PIGMENT   Hgb urine dipstick   (*)    Value: TEST NOT REPORTED DUE TO COLOR INTERFERENCE OF URINE PIGMENT   Bilirubin Urine   (*)    Value: TEST NOT REPORTED DUE TO COLOR INTERFERENCE OF URINE PIGMENT   Ketones, ur   (*)    Value: TEST NOT REPORTED DUE TO COLOR INTERFERENCE OF URINE PIGMENT   Protein, ur   (*)    Value: TEST NOT REPORTED DUE TO COLOR INTERFERENCE OF URINE PIGMENT   Nitrite   (*)     Value: TEST NOT REPORTED DUE TO COLOR INTERFERENCE OF URINE PIGMENT   Leukocytes,Ua   (*)    Value: TEST NOT REPORTED DUE TO COLOR INTERFERENCE OF URINE PIGMENT   All other components within normal limits  BASIC METABOLIC PANEL - Abnormal; Notable for the following components:   Glucose, Bld 110 (*)    Calcium 8.6 (*)    All other components within normal limits  URINALYSIS, MICROSCOPIC (REFLEX) - Abnormal; Notable for the following components:   Bacteria, UA FEW (*)    All other components within normal limits  URINE CULTURE  CBC WITH DIFFERENTIAL/PLATELET  PSA    EKG None  Radiology No results found.  Procedures Procedures    Medications Ordered in ED Medications - No data to display  ED Course/ Medical Decision Making/ A&P                           Medical Decision Making Amount and/or Complexity of Data Reviewed Labs: ordered.  Risk Prescription drug management.   This patient presents to the ED with chief complaint(s) of gross hematuria with pertinent past medical history of likely enlarged prostate and recent GU complications which include urinary retention requiring Foley catheter and hematuria on antibiotics at this time which further complicates the presenting complaint. The complaint involves an extensive differential diagnosis and treatment options and also carries with it a high risk of complications and morbidity.    The differential diagnosis includes : Prostate cancer, BPH, prostatitis, hemorrhagic cystitis, bladder tumor/cancer.  The initial plan is to order basic labs, urine analysis, urine culture, bladder scan.   Additional history obtained: Additional history obtained from family Records reviewed previous admission documents.  I have reviewed the previous urine culture.  Patient had Citrobacter that grew in his urine, and it is Cipro sensitive.  Reassessment and review (also see workup area): Lab Tests: I Ordered, and personally interpreted  labs.  The pertinent results include: UA with hematuria, pyuria.  No creatinine, normal hemoglobin. Patient's postvoid residual on bladder scan is 130 cc.  He has no abdominal pain.   Consultations Obtained: I requested consultation with the consultant Dr. Jeffie Pollock , and discussed  findings as well as pertinent plan - they recommend: Adding finasteride and extending his ciprofloxacin prescription by 3 more days -they will see the patient in the clinic soon.    Reevaluation of the patient after  these medicines showed that the patient    stayed the same    Complexity of problems addressed: Patients presentation is most consistent with  exacerbation of chronic illness and acute illness/injury with systemic symptoms During patient's assessment  Disposition: After consideration of the diagnostic results and the patients response to treatment,  I feel that the patent would benefit from discharge with strict ER return precautions which have been discussed with the patient and his sister .   Final Clinical Impression(s) / ED Diagnoses Final diagnoses:  Hemorrhagic cystitis  Gross hematuria    Rx / DC Orders ED Discharge Orders          Ordered    ciprofloxacin (CIPRO) 500 MG tablet  Every 12 hours        09/07/21 1351    finasteride (PROSCAR) 5 MG tablet  Daily        09/07/21 1351              Varney Biles, MD 09/07/21 1406

## 2021-09-07 NOTE — ED Triage Notes (Signed)
Large amount of blood in urine onset yesterday  is being treated for bladder infection  was getting better but states is worse now,  denies pain

## 2021-09-08 ENCOUNTER — Telehealth: Payer: Self-pay | Admitting: Internal Medicine

## 2021-09-08 LAB — URINE CULTURE: Culture: NO GROWTH

## 2021-09-08 NOTE — Telephone Encounter (Signed)
Received results from the ER, PSA is elevated in the context of gross hematuria and hemorrhagic cystitis. ER coordinate a follow-up with urology. We will recheck a PSA at this office if necessary.

## 2021-09-09 DIAGNOSIS — R310 Gross hematuria: Secondary | ICD-10-CM | POA: Diagnosis not present

## 2021-09-15 ENCOUNTER — Encounter: Payer: Self-pay | Admitting: Internal Medicine

## 2021-09-15 ENCOUNTER — Other Ambulatory Visit: Payer: Self-pay

## 2021-09-15 ENCOUNTER — Other Ambulatory Visit: Payer: Medicare Other | Admitting: Internal Medicine

## 2021-09-15 VITALS — BP 114/60 | HR 60 | Resp 18 | Wt 239.0 lb

## 2021-09-15 DIAGNOSIS — R600 Localized edema: Secondary | ICD-10-CM

## 2021-09-15 DIAGNOSIS — F03B0 Unspecified dementia, moderate, without behavioral disturbance, psychotic disturbance, mood disturbance, and anxiety: Secondary | ICD-10-CM | POA: Diagnosis not present

## 2021-09-15 DIAGNOSIS — R310 Gross hematuria: Secondary | ICD-10-CM | POA: Diagnosis not present

## 2021-09-15 DIAGNOSIS — Z741 Need for assistance with personal care: Secondary | ICD-10-CM | POA: Diagnosis not present

## 2021-09-15 NOTE — Progress Notes (Signed)
[facility] Follow-Up Visit Telephone: [phone]  Fax: [phone]   Date of encounter: 09/15/21 5:28 PM PATIENT NAME: Nicholas Burgess [address] [place] [address]   [phone] (home)  DOB: 08/28/1940 MRN: [id number] PRIMARY CARE PROVIDER:    Colon Branch, Burgess,  [address] [place] [address] [phone]  REFERRING PROVIDER:   Colon Branch, Burgess [address] [place],  [place] [address] [phone]  RESPONSIBLE PARTY:    Contact Information     Name Relation Home Work Mobile   Nicholas Burgess   [phone]   Nicholas Burgess [phone]          I met face to face with patient and family in his home. Palliative Care was asked to follow this patient by consultation request of  Nicholas Burgess to address advance care planning and complex medical decision making. This is follow-up visit.                                     ASSESSMENT AND PLAN / RECOMMENDATIONS:   Advance Care Planning/Goals of Care: Goals include to maximize quality of life and symptom management. Patient/health care surrogate gave his/her permission to discuss.Our advance care planning conversation included a discussion about:    The value and importance of advance care planning  Identification  of a healthcare agent----reemphasized importance of living will and HCPOA documentation or at least HCPOA at this point (short-term memory may not permit for living will completion now so we may do MOST instead at future visit) Decision not to resuscitate or to de-escalate disease focused treatments due to poor prognosis.  Son has printed living will and Nicholas Burgess paperwork but this is not yet completed. He would like to take some additional time to review documents before filling out. Not ready to discuss DNR or MOST form at this time.  Symptom Management/Plan:  Moderate dementia:  continue aricept 58m qhs,  namenda 536mpo bid, continue daily visits by his son to open up correct slots of pillbox (fill pillbox weekly), avoiding driving, get HCPOA done, I will send resources list for caregiver support and future needs for caregivers/community programs   Dependence for meal prep:  will ask social worker to connect with son, Nicholas Bihariabout meals on wheels or other options to help meet their needs here   Hematuria: new issue that has not resolved with UTI tx, no related pain, some elevation of PSA (not prior to compare in epic), and prior episode of retention, now on flomax and finasteride -continue meds pending urology f/u to evaluate etiology including differential of bladder ca, renal cell ca and prostate  LLE pitting edema: unilateral, nontender, nonerythematous but slight itchy area on calf; recommended f/u asap with PCP for evaluation here--?DVT vs lymph node compressive phenomenon vs some retention causing renal failure and edema (not sure why asymmetric in that instance)  Palliative Care Encounter:  caregiver support needs reviewed, went through medication list for parents to ensure both had all meds on list needed, also d/c'd tylenol pm for [place] to risk of increasing confusion with anticholinergic side effects (plus fall risk and worsening retention), also stop separate benadryl for allergies   Follow up Palliative Care Visit: Palliative care will continue to follow for complex medical decision making, advance care planning, and clarification of goals. Return 8  weeks or prn.  11/12/2021   This visit was coded based on medical decision making (MDM). 32 mins spent on ACP/caregiver support/future planning with dementia dx.  PPS: 50%  HOSPICE ELIGIBILITY/DIAGNOSIS: TBD  Chief Complaint: Follow-up palliative visit  HISTORY OF PRESENT ILLNESS:  Nicholas Burgess is a 81 y.o. year old male  with moderate dementia w/o behaviors, .  Sleeping well at night but also dozes on and off throughout the day.  Patient not aware of any memory difficulties but family reports short term memory deficits are his primary problem. Watches TV most of the time, does not do any specific activities for mental engagement. Son continues to set up pill boxes for mom and dad and then checks that they have taken meds each day. Denies any trouble with swallowing pills or foods/beverages. Weight has decreased to 239 lbs on 09/07/20 (previous 261 about a year ago). They have not been going to [employer] to get biscuit. Son reports that they do not eat well, eating a lot of frozen dinners. Wants to have resources and options for meals. No longer driving. Nicholas Burgess made recommendation after episode when patient got lost. Urology following for elevated PSA and recurrent hematuria. Now taking Finasteride and Tamsulosin and symptoms are improved but hematuria is intermittent. Discussed increasing hydration, beer does not count. Patient is independent with ambulation and ADL's, but does not participate in any organized exercise activity.   Son lives up the street and comes to check daily. He feels both parents are in pretty good shape right now but he wants to be sure they have extra support in place, doctors, nurses, and MSW to check on them periodically. Discussed resources and options for managing needs as disease progresses - DME, [place] agencies, respite care through home health or facilities, meals on wheels, and medication de-prescribing over time. Discussed disease progression and what to expect and parents decline - increasing family support in the home vs. Hiring in home caregivers.  Provided information about Palliative Care Services to include Burgess, SN, and MSW. Follow palliative patients over time about every 3 months. Offered [employer] as caregiver support resources and for adult day programs that are available, out of pocket expense. Discussed PACE program for Medicare/Medicaid eligible patients, does not include memory care.  Discussed accessing [employer] and FB page to learn more about how to manage care as patients decline. Discussed Visual merchandiser for caregiver resources.  History obtained from review of EMR, discussion with primary team, and interview with family, facility staff/caregiver and/or Mr. Guedes.  I reviewed available labs, medications, imaging, studies and related documents from the EMR.  Records reviewed and summarized above.   ROS  General: NAD EYES: denies vision changes ENMT: denies dysphagia Cardiovascular: denies chest pain, denies DOE, left leg swelling noted Pulmonary: denies cough, denies increased SOB Abdomen: endorses good appetite, denies constipation, endorses continence of bowel GU: denies dysuria, endorses continence of urine, has hematuria, prior episode of retention MSK:  denies increased weakness,  no falls reported Skin: denies rashes or wounds, small itchy patch on left leg Neurological: denies pain, denies insomnia Psych: Endorses positive mood Heme/lymph/immuno: denies bruises, abnormal bleeding  Physical Exam: VS: HR 60, BP 114/60, RR 18, 02 Sat 94% RA Current and past weights: 239 (09/15/21), decreased from 261 (March 2022) Constitutional: NAD General: WNWD EYES: anicteric sclera, lids intact, no discharge  ENMT: intact hearing, oral mucous membranes moist, dentition intact CV: S1S2, RRR, LLE 1+ pitting edema right foot  to shin  Pulmonary: LCTA, no increased work of breathing, no cough, room air Abdomen: intake 100%, normo-active BS + 4 quadrants, soft and non tender, no ascites GU: deferred MSK: no sarcopenia, moves all extremities, ambulatory Skin: warm and dry, non-weeping clear, raised rash on left lower leg--very small area that is pruritic.  Neuro:  no generalized weakness,  has cognitive impairment Psych: non-anxious affect, A and O x 2 - positive short term memory loss Hem/lymph/immuno: no widespread bruising  CURRENT PROBLEM LIST:   Patient Active Problem List   Diagnosis Date Noted   MCI (mild cognitive impairment) 10/17/2019   Right shoulder pain 03/01/2017   Seborrheic keratosis 08/26/2016   Follow-up -------- PCP NOTES 03/18/2015   Hematospermia 12/13/2014   Annual physical exam 09/14/2013   LOW BACK PAIN SYNDROME 01/19/2010   Hyperlipidemia 04/15/2009   COLONIC POLYPS, HX OF 04/15/2009   ARTHRALGIA 11/04/2008   Essential hypertension 04/04/2008   Hyperglycemia 04/04/2008   DIVERTICULITIS, HX OF 04/04/2008   CALCANEAL SPUR 06/12/2007   PAST MEDICAL HISTORY:  Active Ambulatory Problems    Diagnosis Date Noted   Hyperlipidemia 04/15/2009   Essential hypertension 04/04/2008   ARTHRALGIA 11/04/2008   LOW BACK PAIN SYNDROME 01/19/2010   CALCANEAL SPUR 06/12/2007   Hyperglycemia 04/04/2008   COLONIC POLYPS, HX OF 04/15/2009   DIVERTICULITIS, HX OF 04/04/2008   Annual physical exam 09/14/2013   Hematospermia 12/13/2014   Follow-up -------- PCP NOTES 03/18/2015   Seborrheic keratosis 08/26/2016   Right shoulder pain 03/01/2017   MCI (mild cognitive impairment) 10/17/2019   Resolved Ambulatory Problems    Diagnosis Date Noted   HYPOKALEMIA 01/30/2010   UNS ADVRS EFF UNS RX MEDICINAL&BIOLOGICAL SBSTNC 10/30/2007   CERUMEN IMPACTION, BILATERAL 07/22/2010   HEARING LOSS, UNSPEC. 07/22/2010   SINUSITIS- ACUTE-NOS 07/22/2010   Vasculitis ([place]) 03/19/2013   Past Medical History:  Diagnosis Date   Diverticulosis of Nicholas    Erectile dysfunction due to arterial insufficiency    Hx of colonic polyps 2007   Hypertension    Syncope 2002   SOCIAL HX:  Social History   Tobacco Use   Smoking status: Former    Types: Cigarettes    Quit date: 07/12/1980    Years since quitting: 41.2   Smokeless tobacco: Never   Tobacco comments:    smoked 1959-1982, up to 1/2 ppd  Substance Use Topics   Alcohol use: Yes    Comment:  socially     ALLERGIES: No Known Allergies   PERTINENT MEDICATIONS:  Outpatient  Encounter Medications as of 09/15/2021  Medication Sig   acetaminophen (TYLENOL) 500 MG tablet Take 500 mg by mouth every 6 (six) hours as needed for pain. For headache pain   atorvastatin (LIPITOR) 40 MG tablet Take 1 tablet (40 mg total) by mouth at bedtime.   ciprofloxacin (CIPRO) 500 MG tablet Take 1 tablet (500 mg total) by mouth every 12 (twelve) hours.   diphenhydrAMINE (BENADRYL) 25 MG tablet Take 25 mg by mouth every 6 (six) hours as needed for allergies.   donepezil (ARICEPT) 10 MG tablet Take 1 tablet (10 mg total) by mouth at bedtime.   finasteride (PROSCAR) 5 MG tablet Take 1 tablet (5 mg total) by mouth daily.   memantine (NAMENDA) 5 MG tablet 1 tablet daily for 1 week, then 1 tablet twice daily (Patient taking differently: Take 5 mg by mouth See admin instructions. 1 tablet daily for 1 week, then 1 tablet twice daily)   metoprolol succinate (TOPROL-XL) 100 MG  24 hr tablet Take 1.5 tablets (150 mg total) by mouth daily.   tamsulosin (FLOMAX) 0.4 MG CAPS capsule Take 0.4 mg by mouth at bedtime.   No facility-administered encounter medications on file as of 09/15/2021.   Thank you for the opportunity to participate in the care of Mr. Charlesworth.  The palliative care team will continue to follow. Please call our office at [phone] if we can be of additional assistance.   Hollace Kinnier, DO  COVID-19 PATIENT SCREENING TOOL Asked and negative response unless otherwise noted:  Have you had symptoms of covid, tested positive or been in contact with someone with symptoms/positive test in the past 5-10 days? no

## 2021-09-22 DIAGNOSIS — Z515 Encounter for palliative care: Secondary | ICD-10-CM

## 2021-09-22 NOTE — Progress Notes (Signed)
Mobile Meals Referral ?PC SW completed a mobile meals referral for patient. SW spoke with patient Nicholas Burgess with [facility] ([phone]) for referral. Patient will be placed on the waiting list.  ?

## 2021-10-12 DIAGNOSIS — R310 Gross hematuria: Secondary | ICD-10-CM | POA: Diagnosis not present

## 2021-10-12 DIAGNOSIS — R319 Hematuria, unspecified: Secondary | ICD-10-CM | POA: Diagnosis not present

## 2021-10-28 ENCOUNTER — Ambulatory Visit: Payer: Medicare Other | Admitting: Internal Medicine

## 2021-10-28 DIAGNOSIS — R3912 Poor urinary stream: Secondary | ICD-10-CM | POA: Diagnosis not present

## 2021-10-28 DIAGNOSIS — R310 Gross hematuria: Secondary | ICD-10-CM | POA: Diagnosis not present

## 2021-11-02 ENCOUNTER — Ambulatory Visit (INDEPENDENT_AMBULATORY_CARE_PROVIDER_SITE_OTHER): Payer: Medicare Other | Admitting: Internal Medicine

## 2021-11-02 ENCOUNTER — Encounter: Payer: Self-pay | Admitting: Internal Medicine

## 2021-11-02 VITALS — BP 132/68 | HR 63 | Temp 97.9°F | Resp 18 | Ht 75.0 in | Wt 244.0 lb

## 2021-11-02 DIAGNOSIS — Z87898 Personal history of other specified conditions: Secondary | ICD-10-CM

## 2021-11-02 DIAGNOSIS — G309 Alzheimer's disease, unspecified: Secondary | ICD-10-CM

## 2021-11-02 DIAGNOSIS — M7989 Other specified soft tissue disorders: Secondary | ICD-10-CM | POA: Diagnosis not present

## 2021-11-02 DIAGNOSIS — N400 Enlarged prostate without lower urinary tract symptoms: Secondary | ICD-10-CM

## 2021-11-02 DIAGNOSIS — F02B0 Dementia in other diseases classified elsewhere, moderate, without behavioral disturbance, psychotic disturbance, mood disturbance, and anxiety: Secondary | ICD-10-CM | POA: Diagnosis not present

## 2021-11-02 DIAGNOSIS — G3184 Mild cognitive impairment, so stated: Secondary | ICD-10-CM

## 2021-11-02 DIAGNOSIS — E538 Deficiency of other specified B group vitamins: Secondary | ICD-10-CM

## 2021-11-02 DIAGNOSIS — Z23 Encounter for immunization: Secondary | ICD-10-CM

## 2021-11-02 NOTE — Progress Notes (Signed)
? ?Subjective:  ? ? Patient ID: Nicholas Burgess, male    DOB: 1941/02/17, 81 y.o.   MRN: [id number] ? ?DOS:  11/02/2021 ?Type of visit - description: f/u ? ?Here with his son. ?We talk about dementia, leg swelling, recent increase PSA. ?Overall he feels well. ?Memory stable, no better or worse.  No behavioral issues ? ?Review of Systems ?No chest pain, no difficulty breathing, no palpitations ? ?Past Medical History:  ?Diagnosis Date  ? Diverticulosis of colon   ? Erectile dysfunction due to arterial insufficiency   ? Hematospermia   ? Hx of colonic polyps 2007  ? Hyperlipidemia   ? Hypertension   ? Syncope 2002  ? ? ?Past Surgical History:  ?Procedure Laterality Date  ? APPENDECTOMY  1992  ? CATARACT EXTRACTION Bilateral 06/13/2017  ? COLONOSCOPY W/ POLYPECTOMY  2007 & 2012  ? Dr.Perry ; hyperplastic  ? INGUINAL HERNIA REPAIR  2009  ? bilateral  ? [place]  ? ? ?Current Outpatient Medications  ?Medication Instructions  ? atorvastatin (LIPITOR) 40 mg, Oral, Daily at bedtime  ? donepezil (ARICEPT) 10 mg, Oral, Daily at bedtime  ? finasteride (PROSCAR) 5 mg, Oral, Daily  ? memantine (NAMENDA) 5 MG tablet 1 tablet daily for 1 week, then 1 tablet twice daily  ? metoprolol succinate (TOPROL-XL) 150 mg, Oral, Daily  ? tamsulosin (FLOMAX) 0.4 mg, Oral, Daily at bedtime  ? ? ?   ?Objective:  ? Physical Exam ?BP 132/68 (BP Location: Left Arm, Patient Position: Sitting, Cuff Size: Normal)   Pulse 63   Temp 97.9 ?F (36.6 ?C) (Oral)   Resp 18   Ht '6\' 3"'$  (1.905 m)   Wt 244 lb (110.7 kg)   SpO2 93%   BMI 30.50 kg/m?  ?General:   ?Well developed, NAD, BMI noted. ?HEENT:  ?Normocephalic . Face symmetric, atraumatic ?Lungs:  ?CTA B ?Normal respiratory effort, no intercostal retractions, no accessory muscle use. ?Heart: RRR,  no murmur.  ?Lower extremities:  ?R leg: Trace pretibial and dorsal edema. ?L leg: +/+++ Edema pretibially and at the dorsum.  No warmness, no redness.  Calf is 1.5 inches larger in  circumference compared to the R ?Skin: Not pale. Not jaundice ?Neurologic:  ?alert & pleasantly demented, follow commands. ?Speech normal, gait appropriate for age and unassisted ?Psych--  ?Behavior appropriate. ?No anxious or depressed appearing.  ? ?   ?Assessment   ? ? Assessment  ?Prediabetes ?HTN dc amlodipine 05-2015 dt edema ?Leg edema, worse on the left, neg [place] for  DVT 01/2018 ?Hyperlipidemia ?Urology ?-Hematospermia ?1 ( 12-2014) -- saw urology 09-2015, RTC prn ?-BPH, LUTS, urinary retention status post Foley catheter 07-2021 ?Syncope 2002 ?Skin cancer  ?[facility] MMSE 21 (10-2019), rx aricept  ?B12 deficiency ? ?PLAN ?Dementia: See last visit, Namenda was added to Aricept, no better or worse.  No behavioral issues. ?Family took away the keys of his car, I explained the patient the rationale behind that decision which I support. ?Plan: No change, doing well. ?L leg swelling:  pt's son noticed left leg swelling for the last several weeks, on chart review this has been an issue before, left leg ultrasound 2019: no DVT .  I suspect this is not an acute issue, recommend frequent walks within the house, low-salt diet.  Call if acutely worse or calf pain. ?B12 deficiency: New issue, recommended injections however he is doing oral supplements, recheck levels on RTC. ?BPH, LUTS, urinary retention status post Foley catheter 07-2021: ?Chart  reviewed: ?Was seen recently at the ER with gross hematuria. ?Subsequently saw urology again, they rec finasteride and tamsulosin. ?Has a likelihood of urinary retention to return, they talk about possible surgical treatment, family and patient are hesitant. ?Preventive care: PMN 20 provided today ?RTC 4 months CPX ? ? ?Time spent 30 minutes, multiple question ref  dementia and leg swelling answer.  Also extensive chart review ?  ?This visit occurred during the SARS-CoV-2 public health emergency.  Safety protocols were in place, including screening questions prior to the visit, additional  usage of staff PPE, and extensive cleaning of exam room while observing appropriate contact time as indicated for disinfecting solutions.  ? ?

## 2021-11-02 NOTE — Patient Instructions (Addendum)
Continue the same meds since ? ?Try to eat less salt. ? ?Try to exercise your legs, take small walks within your house frequently. ? ?If you have increasing left leg swelling, pain in the left  calf: Let me know ? ? ? ?GO TO THE FRONT DESK, PLEASE SCHEDULE YOUR APPOINTMENTS ?Come back for a physical exam in 4 months ? ? ?

## 2021-11-03 ENCOUNTER — Telehealth: Payer: Self-pay | Admitting: Internal Medicine

## 2021-11-03 DIAGNOSIS — Z87898 Personal history of other specified conditions: Secondary | ICD-10-CM | POA: Insufficient documentation

## 2021-11-03 MED ORDER — QUETIAPINE FUMARATE 25 MG PO TABS: 25.0000 mg | ORAL_TABLET | Freq: Every day | ORAL | 0 refills | Status: DC

## 2021-11-03 NOTE — Telephone Encounter (Signed)
LMOM informing Pt's son [name] of recommendations. [name] does come to Pt's appointments with him. Rx sent to [facility]  ?

## 2021-11-03 NOTE — Telephone Encounter (Signed)
Pt's son stated he has been more irritable lately with his condition. He is wondering if anything can be prescribed to help his father. Son is aware he is not on dpr and stated that is okay and they will fill it out next time he is in here. Please advise. ? ?[address], [place]  ?[place], [place] [address]  ?Phone:  [phone]  Fax:  [phone]  ?

## 2021-11-03 NOTE — Assessment & Plan Note (Signed)
Dementia: See last visit, Namenda was added to Aricept, no better or worse.  No behavioral issues. ?Family took away the keys of his car, I explained the patient the rationale behind that decision which I support. ?Plan: No change, doing well. ?L leg swelling:  pt's son noticed left leg swelling for the last several weeks, on chart review this has been an issue before, left leg ultrasound 2019: no DVT .  I suspect this is not an acute issue, recommend frequent walks within the house, low-salt diet.  Call if acutely worse or calf pain. ?B12 deficiency: New issue, recommended injections however he is doing oral supplements, recheck levels on RTC. ?BPH, LUTS, urinary retention status post Foley catheter 07-2021: ?Chart reviewed: ?Was seen recently at the ER with gross hematuria. ?Subsequently saw urology again, they rec finasteride and tamsulosin. ?Has a likelihood of urinary retention to return, they talk about possible surgical treatment, family and patient are hesitant. ?Preventive care: PMN 20 provided today ?RTC 4 months CPX ?

## 2021-11-03 NOTE — Telephone Encounter (Signed)
We can start Seroquel 25 mg daily. ?They need to watch for side effects including generalized muscle spasms, difficulty with gait ?

## 2021-11-03 NOTE — Telephone Encounter (Signed)
Please advise 

## 2021-11-09 NOTE — Progress Notes (Signed)
cancelled

## 2021-11-12 ENCOUNTER — Other Ambulatory Visit: Payer: Medicare Other | Admitting: Internal Medicine

## 2021-11-12 DIAGNOSIS — R310 Gross hematuria: Secondary | ICD-10-CM | POA: Diagnosis not present

## 2021-11-12 DIAGNOSIS — R600 Localized edema: Secondary | ICD-10-CM | POA: Diagnosis not present

## 2021-11-12 DIAGNOSIS — F03B0 Unspecified dementia, moderate, without behavioral disturbance, psychotic disturbance, mood disturbance, and anxiety: Secondary | ICD-10-CM | POA: Diagnosis not present

## 2021-11-12 DIAGNOSIS — Z741 Need for assistance with personal care: Secondary | ICD-10-CM

## 2021-11-12 DIAGNOSIS — Z515 Encounter for palliative care: Secondary | ICD-10-CM | POA: Diagnosis not present

## 2021-11-12 NOTE — Progress Notes (Signed)
[place] Follow-Up Visit Telephone: [phone]  Fax: [phone]   Date of encounter: 11/12/21 7:22 AM PATIENT NAME: Nicholas Burgess [address] [id number]   [phone] (home)  DOB: 04/13/1941 MRN: [id number] PRIMARY CARE PROVIDER:    Colon Branch, MD,  [address] [place] [address] [phone]  REFERRING PROVIDER:   Colon Branch, MD [address] [place],  [place] [address] [phone]  RESPONSIBLE PARTY:    Contact Information     Name Relation Home Work Mobile   [place], P[place]   [phone]   [place] Spouse [phone]          I met face to face with patient and family in his home. Palliative Care was asked to follow this patient by consultation request of  Colon Branch, MD to address advance care planning and complex medical decision making. This is follow-up visit.                                     ASSESSMENT AND PLAN / RECOMMENDATIONS:   Advance Care Planning/Goals of Care: Goals include to maximize quality of life and symptom management. Patient/health care surrogate gave his/her permission to discuss.Our advance care planning conversation included a discussion about:    The value and importance of advance care planning  Experiences with loved ones who have been seriously ill or have died  Exploration of personal, cultural or spiritual beliefs that might influence medical decisions  Exploration of goals of care in the event of a sudden injury or illness  Identification  of a healthcare agent  Review and updating or creation of an  advance directive document . Decision not to resuscitate or to de-escalate disease focused treatments due to poor prognosis. CODE STATUS:  DNR, limited additional interventions (still want to return to hospital if felt he will benefit from this), determine use or limitation of abx if infection occurs, IVF trial, NO  feeding tube as discussed with pt, his wife and his son, [name].    Symptom Management/Plan: 1. Moderate dementia without behavioral disturbance, psychotic disturbance, mood disturbance, or anxiety, unspecified dementia type ([place]) -gradually progressive -biggest struggle lately has been agitation related to no longer driving--pt very frustrated and upset about this even though [name] is willing to drive him wherever he wants to go and all agree that he's no longer safe (at risk of getting lost and poor response times)--tolerating the seroquel to address this though he may be sleeping more than previously  2. Dependent for meal preparation -[name] reports he initially heard that pt and his wife would be put on mobile meals wait list, but has not heard an update and would like one as this is challenging for them with neither driving  3. Gross hematuria -no further episodes here recently, taking both finasteride and flomax to help with BPH   4. Edema of left lower extremity -turned out not to be new with prior negative venous doppler, not painful and seems to be stable, elevate feet at rest  5. Palliative care by specialist -discussed goals given dementia diagnosis (include staying in the home as long as safe and feasible) and MOST completed today as above, uploaded to vynca/cone chart  Follow up Palliative Care Visit: Palliative care will continue to follow for complex medical decision making, advance care planning, and  clarification of goals.   This visit was coded based on medical decision making (MDM). 40 minutes spent on ACP  PPS: 50%  HOSPICE ELIGIBILITY/DIAGNOSIS: Not currently/dementia  Chief Complaint: Follow-up palliative visit  HISTORY OF PRESENT ILLNESS:  Nicholas Burgess is a 81 y.o. year old male  with dementia, BPH, hematuria, htn, low back pain, hyperglycemia, urinary retention among others seen for palliative care in-home follow-up.  Has seen his PCP and urology since I last  visited.  Left leg swelling not actually new and prior US negative for DVT so observation, decreased salt and ambulation were advised.  At urology, he was continued on his flomax and finasteride for BPH and if hematuria continued, surgery was recommended which his family was trying to avoid.  Dementia was deemed to be about the same.  He's no longer driving.  Dad is doing better since last time.  He had several urology visits.  He remains on his two meds for the BPH.  He's not had anymore retention.  Hematuria finally stopped.  It came from the prostate.   He's been out there 5 times to urology.  As long as he takes his pills at night.  [name] is setting up the pillbox each Sunday.  He says that is under control now.  He will miss a pill every once in a while, but not often.    Memory seems to be about the same per Kevin--he's now on namenda bid.    He gained 5 lbs with Dr. Larose Kells.  Sleeps well at night per pt--his wife says he will be up bright and early, but then he falls back to sleep and sleeps quite a bit through the day.  Nap in the morning and the afternoon in his recliner.  Bowels are moving well.    They have been eating quite a few frozen dinners.  They have not gotten meal deliveries yet.  They remain on the waiting list.  They do not drive so need this.  Dad still drives occasionally--has not driven lately.  No pain.  He thinks he can mow the grass.    No hallucinations or delusions, just getting aggravated about not driving.  PCP had begun seroquel for agitation.  His wife things it's helped.  History obtained from review of EMR, discussion with primary team, and interview with family, facility staff/caregiver and/or Nicholas Burgess.  I reviewed available labs, medications, imaging, studies and related documents from the EMR.  Records reviewed and summarized above.   ROS General: NAD EYES: denies vision changes ENMT: denies dysphagia Cardiovascular: denies chest pain, denies DOE Pulmonary:  denies cough, denies increased SOB Abdomen: endorses good appetite, denies constipation, endorses continence of bowel GU: denies dysuria, endorses some incontinence of urine, h/o hematuria, has BPH with LUTS MSK:  has increased weakness,  has to pause when stands, no falls reported Skin: denies rashes or wounds Neurological: denies pain, denies insomnia Psych: Endorses positive mood Heme/lymph/immuno: denies bruises, abnormal bleeding  Physical Exam: Current and past weights:  244 lbs last week, up 5 lbs Constitutional: NAD General: frail appearing, overweight EYES: anicteric sclera, lids intact, no discharge  ENMT: intact hearing, oral mucous membranes moist, dentition intact CV: S1S2, RRR, left LE edema Pulmonary: LCTA, no increased work of breathing, no cough, room air Abdomen: intake 100%, normo-active BS + 4 quadrants, soft and non tender, no ascites GU: deferred MSK: no sarcopenia, moves all extremities, ambulatory w/o assistive device Skin: warm and dry, no rashes or wounds on  visible skin Neuro:  generalized weakness, moderate cognitive impairment Psych: non-anxious affect, A and O x 2, repeats himself some Hem/lymph/immuno: no widespread bruising  CURRENT PROBLEM LIST:  Patient Active Problem List   Diagnosis Date Noted   History of urinary retention 11/03/2021   Dementia ([place]) 10/17/2019   Right shoulder pain 03/01/2017   Seborrheic keratosis 08/26/2016   Follow-up -------- PCP NOTES 03/18/2015   BPH (benign prostatic hyperplasia) 12/13/2014   Annual physical exam 09/14/2013   LOW BACK PAIN SYNDROME 01/19/2010   Hyperlipidemia 04/15/2009   COLONIC POLYPS, HX OF 04/15/2009   Essential hypertension 04/04/2008   Hyperglycemia 04/04/2008   DIVERTICULITIS, HX OF 04/04/2008   PAST MEDICAL HISTORY:  Active Ambulatory Problems    Diagnosis Date Noted   Hyperlipidemia 04/15/2009   Essential hypertension 04/04/2008   LOW BACK PAIN SYNDROME 01/19/2010   Hyperglycemia  04/04/2008   COLONIC POLYPS, HX OF 04/15/2009   DIVERTICULITIS, HX OF 04/04/2008   Annual physical exam 09/14/2013   BPH (benign prostatic hyperplasia) 12/13/2014   Follow-up -------- PCP NOTES 03/18/2015   Seborrheic keratosis 08/26/2016   Right shoulder pain 03/01/2017   Dementia ([place]) 10/17/2019   History of urinary retention 11/03/2021   Resolved Ambulatory Problems    Diagnosis Date Noted   HYPOKALEMIA 01/30/2010   Calcaneal spur 06/12/2007   UNS ADVRS EFF UNS RX MEDICINAL&BIOLOGICAL SBSTNC 10/30/2007   CERUMEN IMPACTION, BILATERAL 07/22/2010   HEARING LOSS, UNSPEC. 07/22/2010   SINUSITIS- ACUTE-NOS 07/22/2010   Vasculitis ([place]) 03/19/2013   Past Medical History:  Diagnosis Date   Diverticulosis of colon    Erectile dysfunction due to arterial insufficiency    Hematospermia    Hx of colonic polyps 2007   Hypertension    Syncope 2002   SOCIAL HX:  Social History   Tobacco Use   Smoking status: Former    Types: Cigarettes    Quit date: 07/12/1980    Years since quitting: 41.3   Smokeless tobacco: Never   Tobacco comments:    smoked 1959-1982, up to 1/2 ppd  Substance Use Topics   Alcohol use: Yes    Comment:  socially     ALLERGIES: No Known Allergies   PERTINENT MEDICATIONS:  Outpatient Encounter Medications as of 11/12/2021  Medication Sig   atorvastatin (LIPITOR) 40 MG tablet Take 1 tablet (40 mg total) by mouth at bedtime.   donepezil (ARICEPT) 10 MG tablet Take 1 tablet (10 mg total) by mouth at bedtime.   finasteride (PROSCAR) 5 MG tablet Take 1 tablet (5 mg total) by mouth daily.   memantine (NAMENDA) 5 MG tablet 1 tablet daily for 1 week, then 1 tablet twice daily (Patient taking differently: Take 5 mg by mouth See admin instructions. 1 tablet daily for 1 week, then 1 tablet twice daily)   metoprolol succinate (TOPROL-XL) 100 MG 24 hr tablet Take 1.5 tablets (150 mg total) by mouth daily.   QUEtiapine (SEROQUEL) 25 MG tablet Take 1 tablet (25 mg total)  by mouth at bedtime.   tamsulosin (FLOMAX) 0.4 MG CAPS capsule Take 0.4 mg by mouth at bedtime.   No facility-administered encounter medications on file as of 11/12/2021.   Thank you for the opportunity to participate in the care of Mr. Izquierdo.  The palliative care team will continue to follow. Please call our office at [phone] if we can be of additional assistance.   Hollace Kinnier, DO  COVID-19 PATIENT SCREENING TOOL Asked and negative response unless otherwise noted:  Have you had  symptoms of covid, tested positive or been in contact with someone with symptoms/positive test in the past 5-10 days? no

## 2021-11-20 ENCOUNTER — Encounter: Payer: Self-pay | Admitting: Internal Medicine

## 2021-11-25 ENCOUNTER — Other Ambulatory Visit: Payer: Self-pay

## 2021-11-25 DIAGNOSIS — Z515 Encounter for palliative care: Secondary | ICD-10-CM

## 2021-11-25 NOTE — Progress Notes (Signed)
SW completed a follow-up with Elexis at [facility] [employer] regarding the patient mobile meals referral. Ubaldo Glassing asked SW to forward information via email and she will try to expedite this request as patient as changes.  ?

## 2021-11-30 ENCOUNTER — Other Ambulatory Visit: Payer: Self-pay | Admitting: Internal Medicine

## 2021-12-02 ENCOUNTER — Telehealth: Payer: Self-pay

## 2021-12-03 ENCOUNTER — Other Ambulatory Visit: Payer: Self-pay | Admitting: Internal Medicine

## 2021-12-03 MED ORDER — SERTRALINE HCL 25 MG PO TABS
25.0000 mg | ORAL_TABLET | Freq: Every day | ORAL | 3 refills | Status: DC
Start: 2021-12-03 — End: 2022-12-10

## 2021-12-03 NOTE — Progress Notes (Signed)
Received call from Kirby Crigler re: his dad.  Quetiapine does not seem to be helping his dad's agitation re: not driving and irritability.  Therefore, I recommended adding zoloft '25mg'$  daily to his regimen to help with this.  [name] agrees with the recommendation and I sent to [facility].

## 2021-12-10 NOTE — Telephone Encounter (Signed)
SW left a message for patient's son advising that mobile meals will be contacting him to move forward with starting services.

## 2021-12-28 ENCOUNTER — Telehealth: Payer: Self-pay

## 2021-12-28 NOTE — Telephone Encounter (Signed)
Patient's son  Nicholas Burgess 12/25/1971   Wants to know if Dr.Paz will be willing to take him on as a new patient since his dad is his patient      [phone]

## 2021-12-29 NOTE — Telephone Encounter (Signed)
Error-documented into wrong chart.

## 2022-01-11 ENCOUNTER — Other Ambulatory Visit: Payer: Self-pay | Admitting: Internal Medicine

## 2022-02-18 ENCOUNTER — Other Ambulatory Visit: Payer: Medicare Other | Admitting: Internal Medicine

## 2022-02-18 DIAGNOSIS — Z515 Encounter for palliative care: Secondary | ICD-10-CM

## 2022-02-18 DIAGNOSIS — F03B0 Unspecified dementia, moderate, without behavioral disturbance, psychotic disturbance, mood disturbance, and anxiety: Secondary | ICD-10-CM | POA: Diagnosis not present

## 2022-02-18 DIAGNOSIS — R600 Localized edema: Secondary | ICD-10-CM | POA: Diagnosis not present

## 2022-02-18 DIAGNOSIS — Z741 Need for assistance with personal care: Secondary | ICD-10-CM | POA: Diagnosis not present

## 2022-02-18 DIAGNOSIS — R310 Gross hematuria: Secondary | ICD-10-CM | POA: Diagnosis not present

## 2022-03-08 NOTE — Progress Notes (Signed)
Nicholas Burgess Telephone: [phone]  Fax: [phone]   Date of encounter: 02/18/22 @ 1pm PATIENT NAME: Nicholas Burgess [address] [id number]   [phone] (home)  DOB: 1941-05-10 MRN: [id number] PRIMARY CARE PROVIDER:    Colon Branch, MD,  [address] [place] [address] [phone]  REFERRING PROVIDER:   Colon Branch, MD [address] [place],   [address] [phone]  RESPONSIBLE PARTY:    Contact Information     Name Relation Home Work [place] Son   [phone]   [place] Spouse [phone]          I met face to face with patient and family in his home--history from pt, son and wife. Palliative Care was asked to follow this patient by consultation request of  Nicholas Branch, MD to address advance care planning and complex medical decision making. This is follow-up Burgess.                                     ASSESSMENT AND PLAN / RECOMMENDATIONS:   Advance Care Planning/Goals of Care: Goals include to maximize quality of life and symptom management. Patient/health care surrogate gave his/her permission to discuss.Our advance care planning conversation included a discussion about:    The value and importance of advance care planning  Experiences with loved ones who have been seriously ill or have died  Exploration of personal, cultural or spiritual beliefs that might influence medical decisions  Exploration of goals of care in the event of a sudden injury or illness  Identification  of a healthcare agent  Review and updating or creation of an  advance directive document . Decision not to resuscitate or to de-escalate disease focused treatments due to poor prognosis. CODE STATUS:  DNR, MOST on file in vynca and no changes desired today  Symptom Management/Plan: 1. Moderate dementia without behavioral disturbance, psychotic  disturbance, mood disturbance, or anxiety, unspecified dementia type ([place]) -no considerable changes outside of less difficulty with mood lately--continues aricept, namenda, low dose zoloft I'd newly added and seroquel and tolerating well  2. Gross hematuria -no recent episodes of this or infections or retention, continue close monitoring, not a good candidate for surgery or invasive tx with his dementia and delirium risk  3. Dependent for meal preparation -receiving meals on wheels now with benefit  4. Edema of left lower extremity -now right also swollen lately, elevate feet at rest, hydrate with water, avoid high sodium foods and drinks and monitor for skin breakdown; ideally use compression socks on in am and off at hs; no exam findings concerning for pulmonary edema/effusions  5. Palliative care encounter -doing a little better this Burgess than he's been with fewer doctor's visits needed and b/l edema as only relatively new concern -continue to monitor every few months in terms of dementia progression, provide education and expectations -MOST already done and no need to change at present -try to avoid hospitalizations that precipitate delirium and decline    Follow up Palliative Care Burgess: Palliative care will continue to follow for complex medical decision making, advance care planning, and clarification of goals. Return 06/15/2022 And prn.   This Burgess was coded based on medical decision making (MDM).  PPS: 50%  HOSPICE ELIGIBILITY/DIAGNOSIS: TBD  Chief Complaint: Follow-up palliative Burgess  HISTORY  OF PRESENT ILLNESS:  [name] is a 81 y.o. year old male  with dementia, BPH, hematuria, htn, low back pain, hyperglycemia, urinary retention among others seen for palliative care in-home follow-up.  [name] reports that his dad is doing much better since our last Burgess.  There have been no arguments and behavioral issues related to driving recently which had been problematic.   He is eating and sleeping well.  Still sleeps a lot in his recliner chair.  They have meals on wheels which has been a huge weight off [name] and his wife's shoulders to make sure that his parents are fed.    Struggling a bit more with edema of both legs when previously it had primarily been chronic LLE edema.  He is not so good about elevating his feet in his recliner and must be reminded (will do for a little and then put his feet back down).  He also has a recliner in his one large outside building.  Sodium intake unchanged.    He has had fewer difficulties with his bladder recently--no recent hematuria noted, retention or infections. Continues following with urology. Bowels are moving well.  Still has to be encouraged to hydrate with water adequately.  Does not physically move that much though he understands this would be helpful to maintain strength and keep his bowels moving.  History obtained from review of EMR, discussion with primary team, and interview with family, facility staff/caregiver and/or Nicholas Burgess.  I reviewed available labs, medications, imaging, studies and related documents from the EMR.  Records reviewed and summarized above.   ROS Review of Systems  Constitutional:  Positive for fatigue. Negative for activity change, appetite change, chills, fever and unexpected weight change.  HENT:  Negative for congestion and trouble swallowing.   Respiratory:  Negative for chest tightness and shortness of breath.   Cardiovascular:  Positive for leg swelling. Negative for chest pain and palpitations.  Gastrointestinal:  Negative for abdominal pain, constipation, diarrhea and vomiting.  Genitourinary:  Positive for frequency and urgency. Negative for dysuria.  Musculoskeletal:  Positive for arthralgias. Negative for gait problem.  Skin:  Negative for color change.  Neurological:  Negative for dizziness and weakness.  Psychiatric/Behavioral:  Positive for confusion. Negative for  agitation, behavioral problems and sleep disturbance.     Physical Exam: There were no vitals filed for this Burgess. There is no height or weight on file to calculate BMI. Wt Readings from Last 500 Encounters:  11/02/21 244 lb (110.7 kg)  09/15/21 239 lb (108.4 kg)  09/07/21 240 lb (108.9 kg)  08/26/21 251 lb (113.9 kg)  07/21/21 254 lb 4 oz (115.3 kg)  07/17/21 260 lb (117.9 kg)  06/01/21 260 lb (117.9 kg)  06/19/20 260 lb (117.9 kg)  01/22/20 258 lb 8 oz (117.3 kg)  10/15/19 261 lb 2 oz (118.4 kg)  06/12/19 260 lb 6 oz (118.1 kg)  04/03/19 259 lb 9.6 oz (117.8 kg)  11/29/18 269 lb (122 kg)  08/17/18 263 lb (119.3 kg)  05/08/18 261 lb (118.4 kg)  03/29/18 261 lb (118.4 kg)  01/23/18 260 lb 6 oz (118.1 kg)  12/28/17 259 lb 2 oz (117.5 kg)  10/26/17 257 lb 6.4 oz (116.8 kg)  08/09/17 259 lb 2 oz (117.5 kg)  06/16/17 255 lb 6 oz (115.8 kg)  03/28/17 251 lb (113.9 kg)  03/15/17 248 lb 12.8 oz (112.9 kg)  03/01/17 251 lb 3.2 oz (113.9 kg)  02/08/17 254 lb 6 oz (115.4  kg)  08/25/16 239 lb 6 oz (108.6 kg)  11/26/15 236 lb 8 oz (107.3 kg)  05/23/15 240 lb 6 oz (109 kg)  03/18/15 237 lb (107.5 kg)  12/13/14 234 lb (106.1 kg)  09/20/14 230 lb (104.3 kg)  03/20/14 232 lb 2 oz (105.3 kg)  10/09/13 230 lb (104.3 kg)  09/14/13 227 lb (103 kg)  08/13/13 230 lb 3.2 oz (104.4 kg)  05/21/13 231 lb 12.8 oz (105.1 kg)  03/19/13 232 lb (105.2 kg)  03/06/13 231 lb 3.2 oz (104.9 kg)  08/21/12 229 lb (103.9 kg)  05/03/12 227 lb 12.8 oz (103.3 kg)  05/27/11 225 lb (102.1 kg)  04/15/11 224 lb (101.6 kg)  04/01/11 224 lb 6.4 oz (101.8 kg)  10/12/10 (!) 229 lb 3.2 oz (104 kg)  07/22/10 (!) 234 lb 12.8 oz (106.5 kg)   Physical Exam Constitutional:      General: He is not in acute distress.    Appearance: Normal appearance. He is not toxic-appearing.  HENT:     Head: Normocephalic and atraumatic.  Cardiovascular:     Rate and Rhythm: Normal rate and regular rhythm.  Pulmonary:      Effort: Pulmonary effort is normal.     Breath sounds: Normal breath sounds.  Abdominal:     General: Bowel sounds are normal. There is no distension.     Tenderness: There is no abdominal tenderness.  Musculoskeletal:     Right lower leg: Edema present.     Left lower leg: Edema present.     Comments: Left 3+ and right 2+  Neurological:     General: No focal deficit present.     Mental Status: He is alert.     Gait: Gait abnormal.     Comments: Oriented to person, place, not exact time; pleasant, joking more today and more sociable  Psychiatric:        Mood and Affect: Mood normal.        Behavior: Behavior normal.     CURRENT PROBLEM LIST:  Patient Active Problem List   Diagnosis Date Noted   History of urinary retention 11/03/2021   Dementia ([place]) 10/17/2019   Right shoulder pain 03/01/2017   Seborrheic keratosis 08/26/2016   Follow-up -------- PCP NOTES 03/18/2015   BPH (benign prostatic hyperplasia) 12/13/2014   Annual physical exam 09/14/2013   LOW BACK PAIN SYNDROME 01/19/2010   Hyperlipidemia 04/15/2009   COLONIC POLYPS, HX OF 04/15/2009   Essential hypertension 04/04/2008   Hyperglycemia 04/04/2008   DIVERTICULITIS, HX OF 04/04/2008    PAST MEDICAL HISTORY:  Active Ambulatory Problems    Diagnosis Date Noted   Hyperlipidemia 04/15/2009   Essential hypertension 04/04/2008   LOW BACK PAIN SYNDROME 01/19/2010   Hyperglycemia 04/04/2008   COLONIC POLYPS, HX OF 04/15/2009   DIVERTICULITIS, HX OF 04/04/2008   Annual physical exam 09/14/2013   BPH (benign prostatic hyperplasia) 12/13/2014   Follow-up -------- PCP NOTES 03/18/2015   Seborrheic keratosis 08/26/2016   Right shoulder pain 03/01/2017   Dementia ([place]) 10/17/2019   History of urinary retention 11/03/2021   Resolved Ambulatory Problems    Diagnosis Date Noted   HYPOKALEMIA 01/30/2010   Calcaneal spur 06/12/2007   UNS ADVRS EFF UNS RX MEDICINAL&BIOLOGICAL SBSTNC 10/30/2007   CERUMEN IMPACTION,  BILATERAL 07/22/2010   HEARING LOSS, UNSPEC. 07/22/2010   SINUSITIS- ACUTE-NOS 07/22/2010   Vasculitis ([place]) 03/19/2013   Past Medical History:  Diagnosis Date   Diverticulosis of Nicholas    Erectile dysfunction due to  arterial insufficiency    Hematospermia    Hx of colonic polyps 2007   Hypertension    Syncope 2002    SOCIAL HX:  Social History   Tobacco Use   Smoking status: Former    Types: Cigarettes    Quit date: 07/12/1980    Years since quitting: 41.6   Smokeless tobacco: Never   Tobacco comments:    smoked 1959-1982, up to 1/2 ppd  Substance Use Topics   Alcohol use: Yes    Comment:  socially     ALLERGIES: No Known Allergies    PERTINENT MEDICATIONS:  Outpatient Encounter Medications as of 02/18/2022  Medication Sig   atorvastatin (LIPITOR) 40 MG tablet TAKE 1 TABLET (40 MG TOTAL) BY MOUTH AT BEDTIME.   donepezil (ARICEPT) 10 MG tablet TAKE 1 TABLET (10 MG TOTAL) BY MOUTH AT BEDTIME.   finasteride (PROSCAR) 5 MG tablet Take 1 tablet (5 mg total) by mouth daily.   memantine (NAMENDA) 5 MG tablet 1 tablet daily for 1 week, then 1 tablet twice daily (Patient taking differently: Take 5 mg by mouth See admin instructions. 1 tablet daily for 1 week, then 1 tablet twice daily)   metoprolol succinate (TOPROL-XL) 100 MG 24 hr tablet TAKE 1 AND 1/2 TABLETS (150 MG TOTAL) BY MOUTH DAILY.   QUEtiapine (SEROQUEL) 25 MG tablet Take 1 tablet (25 mg total) by mouth at bedtime.   sertraline (ZOLOFT) 25 MG tablet Take 1 tablet (25 mg total) by mouth daily.   tamsulosin (FLOMAX) 0.4 MG CAPS capsule Take 0.4 mg by mouth at bedtime.   No facility-administered encounter medications on file as of 02/18/2022.    Thank you for the opportunity to participate in the care of Nicholas Burgess.  The palliative care team will continue to follow. Please call our office at [phone] if we can be of additional assistance.   Hollace Kinnier, DO  COVID-19 PATIENT SCREENING TOOL Asked and negative  response unless otherwise noted:  Have you had symptoms of covid, tested positive or been in contact with someone with symptoms/positive test in the past 5-10 days? No

## 2022-03-31 ENCOUNTER — Encounter: Payer: Self-pay | Admitting: Internal Medicine

## 2022-03-31 ENCOUNTER — Ambulatory Visit (INDEPENDENT_AMBULATORY_CARE_PROVIDER_SITE_OTHER): Payer: Medicare Other | Admitting: Internal Medicine

## 2022-03-31 VITALS — BP 126/76 | HR 60 | Temp 98.1°F | Resp 18 | Ht 75.0 in | Wt 249.5 lb

## 2022-03-31 DIAGNOSIS — E785 Hyperlipidemia, unspecified: Secondary | ICD-10-CM | POA: Diagnosis not present

## 2022-03-31 DIAGNOSIS — Z0001 Encounter for general adult medical examination with abnormal findings: Secondary | ICD-10-CM | POA: Diagnosis not present

## 2022-03-31 DIAGNOSIS — F02B0 Dementia in other diseases classified elsewhere, moderate, without behavioral disturbance, psychotic disturbance, mood disturbance, and anxiety: Secondary | ICD-10-CM | POA: Diagnosis not present

## 2022-03-31 DIAGNOSIS — E538 Deficiency of other specified B group vitamins: Secondary | ICD-10-CM

## 2022-03-31 DIAGNOSIS — R739 Hyperglycemia, unspecified: Secondary | ICD-10-CM

## 2022-03-31 DIAGNOSIS — G309 Alzheimer's disease, unspecified: Secondary | ICD-10-CM | POA: Diagnosis not present

## 2022-03-31 DIAGNOSIS — I10 Essential (primary) hypertension: Secondary | ICD-10-CM

## 2022-03-31 DIAGNOSIS — Z0000 Encounter for general adult medical examination without abnormal findings: Secondary | ICD-10-CM

## 2022-03-31 LAB — COMPREHENSIVE METABOLIC PANEL
ALT: 16 U/L (ref 0–53)
AST: 16 U/L (ref 0–37)
Albumin: 3.8 g/dL (ref 3.5–5.2)
Alkaline Phosphatase: 115 U/L (ref 39–117)
BUN: 20 mg/dL (ref 6–23)
CO2: 30 mEq/L (ref 19–32)
Calcium: 9 mg/dL (ref 8.4–10.5)
Chloride: 104 mEq/L (ref 96–112)
Creatinine, Ser: 0.99 mg/dL (ref 0.40–1.50)
GFR: 72.27 mL/min (ref >60.00)
Glucose, Bld: 97 mg/dL (ref 70–99)
Potassium: 4.4 mEq/L (ref 3.5–5.1)
Sodium: 140 mEq/L (ref 135–145)
Total Bilirubin: 0.8 mg/dL (ref 0.2–1.2)
Total Protein: 6.7 g/dL (ref 6.0–8.3)

## 2022-03-31 LAB — B12 AND FOLATE PANEL
Folate: 8.9 ng/mL (ref >5.9)
Vitamin B-12: >1500 pg/mL — ABNORMAL HIGH (ref 211–911)

## 2022-03-31 LAB — HEMOGLOBIN A1C: Hgb A1c MFr Bld: 6 % (ref 4.6–6.5)

## 2022-03-31 MED ORDER — CYANOCOBALAMIN 1000 MCG/ML IJ SOLN
1000.0000 ug | Freq: Once | INTRAMUSCULAR | Status: AC
  Administered 2022-03-31: 1000 ug via INTRAMUSCULAR

## 2022-03-31 NOTE — Assessment & Plan Note (Signed)
Here for CPX Prediabetes: Check A1c HTN: BP is very good on metoprolol. Hyperlipidemia: On atorvastatin, last LDL okay. BPH: Currently on Flomax, no recent symptoms or gross hematuria Dementia Seen by palliative care, they noted agitation (d/t not being able to drive his car anymore). They added Seroquel and Zoloft.  Reported good tolerance B12 deficiency: Was Rx injections, I do not think that happened, not taking supplements. Plan: Check labs, B12 shot today, start oral supplements. Allergies?:  see HPI, Rec Flonase and Systane RTC 6 months

## 2022-03-31 NOTE — Progress Notes (Signed)
Subjective:    Patient ID: Nicholas Burgess, male    DOB: 01/06/1941, 81 y.o.   MRN: [id number]  DOS:  03/31/2022 Type of visit - description: cpx  Here with his son, CPX. Has been doing great. Nicholas Burgess did report issues with allergies including postnasal dripping and occasionally watery eyes.  Has tried OTCs antihistaminics without much help. No recent falls or behavioral issues No GU problems  Review of Systems  Other than above, a 14 point review of systems is negative      Past Medical History:  Diagnosis Date   Diverticulosis of colon    Erectile dysfunction due to arterial insufficiency    Hematospermia    Hx of colonic polyps 2007   Hyperlipidemia    Hypertension    Syncope 2002    Past Surgical History:  Procedure Laterality Date   APPENDECTOMY  1992   CATARACT EXTRACTION Bilateral 06/13/2017   COLONOSCOPY W/ POLYPECTOMY  2007 & 2012   Dr.Perry ; hyperplastic   INGUINAL HERNIA REPAIR  2009   bilateral   MANDIBLE FRACTURE SURGERY  1964   Social History   Socioeconomic History   Marital status: Married    Spouse name: Not on file   Number of children: 2   Years of education: Not on file   Highest education level: Not on file  Occupational History   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: [employer]  Tobacco Use   Smoking status: Former    Types: Cigarettes    Quit date: 07/12/1980    Years since quitting: 41.7   Smokeless tobacco: Never   Tobacco comments:    smoked 1959-1982, up to 1/2 ppd  Substance and Sexual Activity   Alcohol use: Yes    Comment:  socially   Drug use: No   Sexual activity: Not on file  Other Topics Concern   Not on file  Social History Narrative   Household pt and wife; she has dementia   One child in [place], another in [place] Strain: [place]  (06/01/2021)   Overall Financial Resource Strain (CARDIA)    Difficulty of Paying Living Expenses: Not hard  at all  Food Insecurity: No Food Insecurity (06/01/2021)   Hunger Vital Sign    Worried About Running Out of Food in the Last Year: Never true    [place] in the Last Year: Never true  Transportation Needs: No Transportation Needs (06/01/2021)   PRAPARE - Hydrologist (Medical): No    Lack of Transportation (Non-Medical): No  Physical Activity: Inactive (06/01/2021)   Exercise Vital Sign    Days of Exercise per Week: 0 days    Minutes of Exercise per Session: 0 min  Stress: No Stress Concern Present (06/01/2021)   [place]    Feeling of Stress : Not at all  Social Connections: Moderately Integrated (06/01/2021)   Social Connection and Isolation Panel [NHANES]    Frequency of Communication with Friends and Family: More than three times a week    Frequency of Social Gatherings with Friends and Family: More than three times a week    Attends Religious Services: More than 4 times per year    Active Member of [employer] or Organizations: No    Attends Archivist Meetings: Never    Marital Status: Married  Human resources officer Violence:  Not At Risk (06/01/2021)   Humiliation, Afraid, Rape, and Kick questionnaire    Fear of Current or Ex-Partner: No    Emotionally Abused: No    Physically Abused: No    Sexually Abused: No     Current Outpatient Medications  Medication Instructions   atorvastatin (LIPITOR) 40 mg, Oral, Daily at bedtime   donepezil (ARICEPT) 10 mg, Oral, Daily at bedtime   finasteride (PROSCAR) 5 mg, Oral, Daily   memantine (NAMENDA) 5 MG tablet 1 tablet daily for 1 week, then 1 tablet twice daily   metoprolol succinate (TOPROL-XL) 100 MG 24 hr tablet TAKE 1 AND 1/2 TABLETS (150 MG TOTAL) BY MOUTH DAILY.   QUEtiapine (SEROQUEL) 25 mg, Oral, Daily at bedtime   sertraline (ZOLOFT) 25 mg, Oral, Daily   tamsulosin (FLOMAX) 0.4 mg, Oral, Daily at bedtime        Objective:   Physical Exam BP 126/76   Pulse 60   Temp 98.1 F (36.7 C) (Oral)   Resp 18   Ht '6\' 3"'$  (1.905 m)   Wt 249 lb 8 oz (113.2 kg)   SpO2 96%   BMI 31.19 kg/m  General: Well developed, NAD, BMI noted Neck: No  thyromegaly  HEENT:  Normocephalic . Face symmetric, atraumatic Lungs:  CTA B Normal respiratory effort, no intercostal retractions, no accessory muscle use. Heart: RRR,  no murmur.  Abdomen:  Not distended, soft, non-tender. No rebound or rigidity.   Lower extremities: Pretibial pitting edema on the L. Skin: Exposed areas without rash. Not pale. Not jaundice Neurologic:  alert & oriented to self only; speech normal, gait appropriate for age and unassisted Strength symmetric and appropriate for age.  Psych: Behavior appropriate. No anxious or depressed appearing.     Assessment     Assessment  Prediabetes HTN dc amlodipine 05-2015 dt edema Leg edema, worse on the left, neg [place] for  DVT 01/2018 Hyperlipidemia Urology -Hematospermia 1 ( 12-2014) -- saw urology 09-2015, RTC prn -BPH, LUTS, urinary retention status post Foley catheter 07-2021 Syncope 2002 Skin cancer  [facility] MMSE 21 (10-2019), rx aricept  B12 deficiency  PLAN Here for CPX Prediabetes: Check A1c HTN: BP is very good on metoprolol. Hyperlipidemia: On atorvastatin, last LDL okay. BPH: Currently on Flomax, no recent symptoms or gross hematuria Dementia Seen by palliative care, they noted agitation (d/t not being able to drive his car anymore). They added Seroquel and Zoloft.  Reported good tolerance B12 deficiency: Was Rx injections, I do not think that happened, not taking supplements. Plan: Check labs, B12 shot today, start oral supplements. Allergies?:  see HPI, Rec Flonase and Systane RTC 6 months  In addition to CPX, I addressed all his chronic medical problems.

## 2022-03-31 NOTE — Patient Instructions (Addendum)
Vaccines I recommend:  Covid booster Flu shot- high dose RSV vaccine Shingrix  For allergies: Flonase over-the-counter: 2 sprays on each side of the  nose daily Systane, eyedrops: BID  Start taking vitamin B12 supplements OTC: 1 tablet every day  Check the  blood pressure regularly BP GOAL is between 110/65 and  135/85. If it is consistently higher or lower, let me know    GO TO THE LAB : Get the blood work     Nicholas Burgess, [name] back for a checkup in 6 months

## 2022-03-31 NOTE — Assessment & Plan Note (Signed)
-  Td 2014 - PNA 2016; Prevnar 11-2015 - Shingles 06/2010 - shingrex, RSV, COVID booster: Discussed. - flu shot discussed.  Declined. --No further prostate or colon cancer screening   -Patient education: H POA on file  -Labs reviewed, will get CMP A1c and B12

## 2022-04-05 IMAGING — CT CT ABD-PELV W/O CM
2 of 4 series · 16 of 46 positions shown, 18 images · non-contrast
Comparison: CT abdomen pelvis dated 12/02/2005.

CLINICAL DATA: BPH and suspected urinary retention.

EXAM:
CT ABDOMEN AND PELVIS WITHOUT CONTRAST
TECHNIQUE: Multidetector CT imaging of the abdomen and pelvis was performed
following the standard protocol without IV contrast.

[Series 2: axial st · axial · 0.98mm/px · z∈[-542,-17]mm · 13 of 120 slices shown, 15 images]
[im 8/120  soft-tissue]
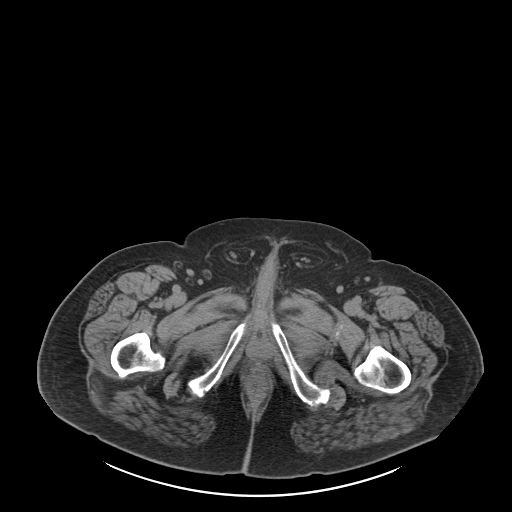
[im 8/120  bone]
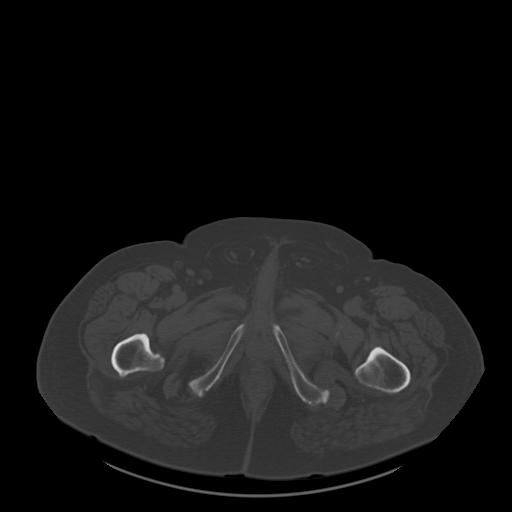
[im 15/120  soft-tissue]
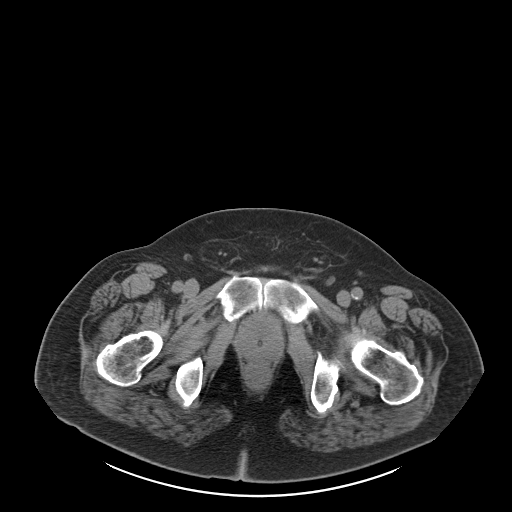
[im 29/120  soft-tissue]
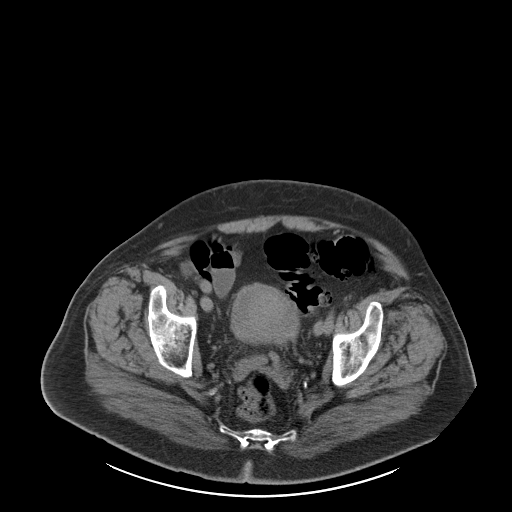
[im 36/120  soft-tissue]
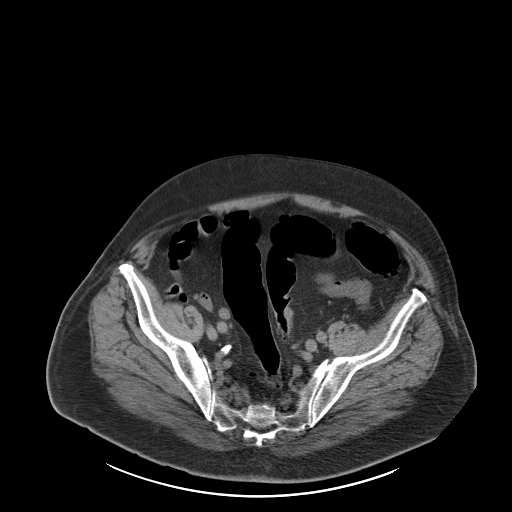
[im 43/120  soft-tissue]
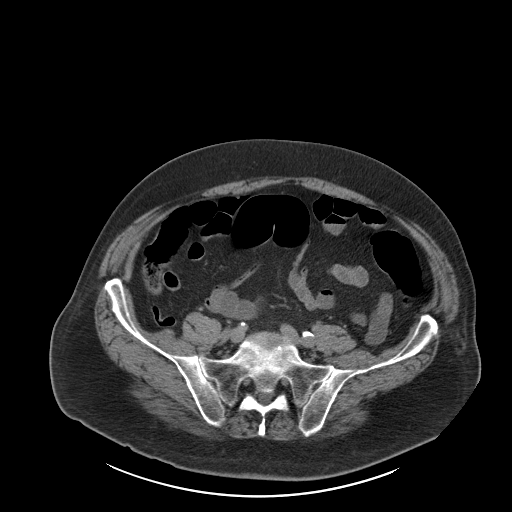
[im 50/120  soft-tissue]
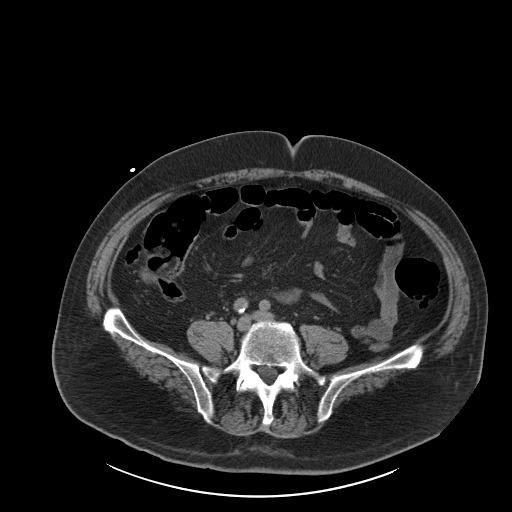
[im 64/120  soft-tissue]
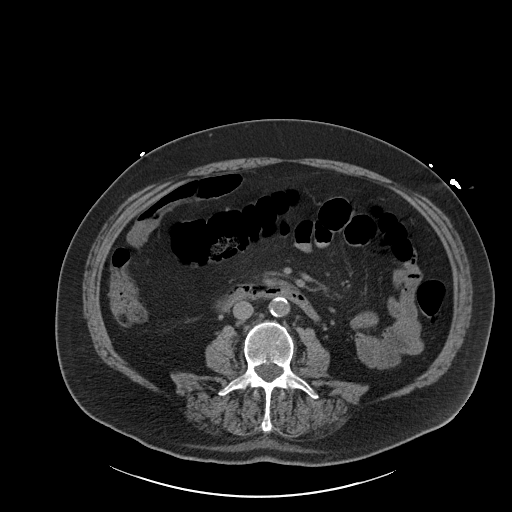
[im 71/120  soft-tissue]
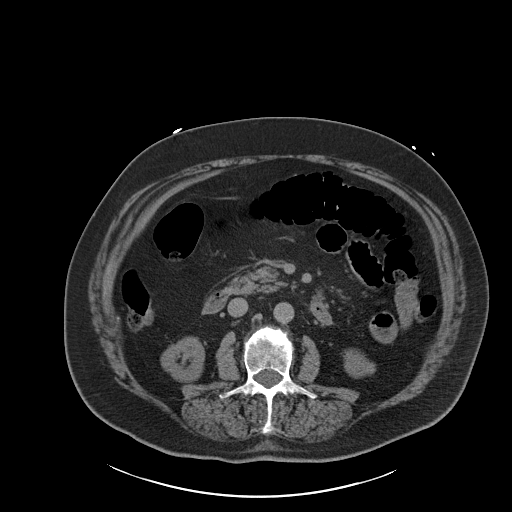
[im 78/120  soft-tissue]
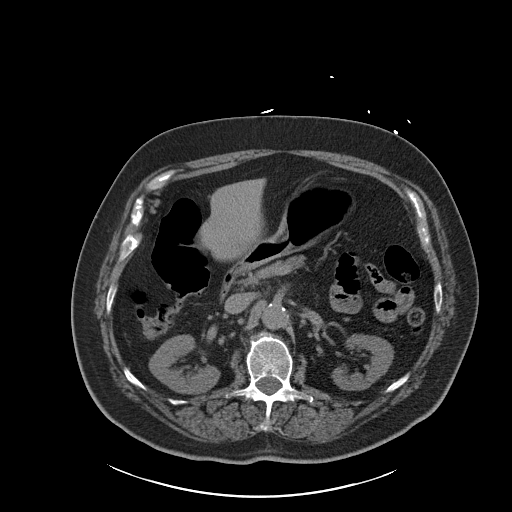
[im 78/120  bone]
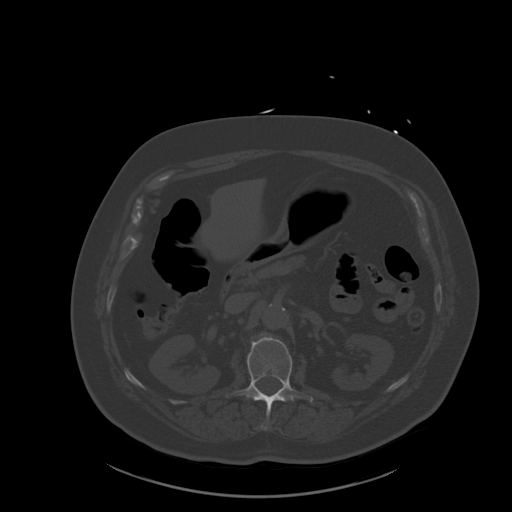
[im 85/120  soft-tissue]
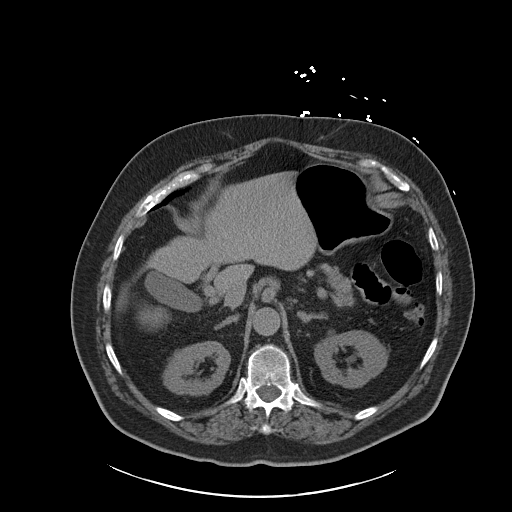
[im 92/120  soft-tissue]
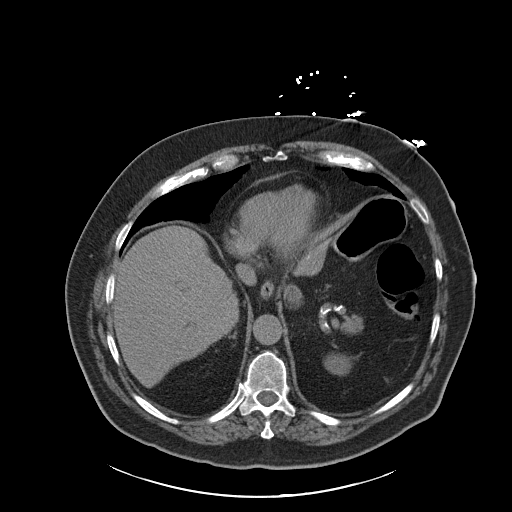
[im 106/120  soft-tissue]
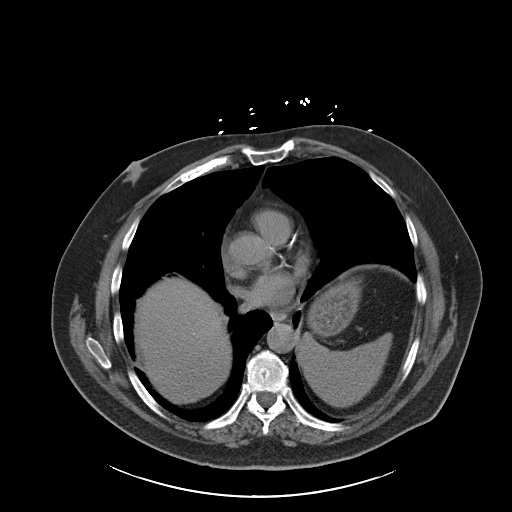
[im 113/120  soft-tissue]
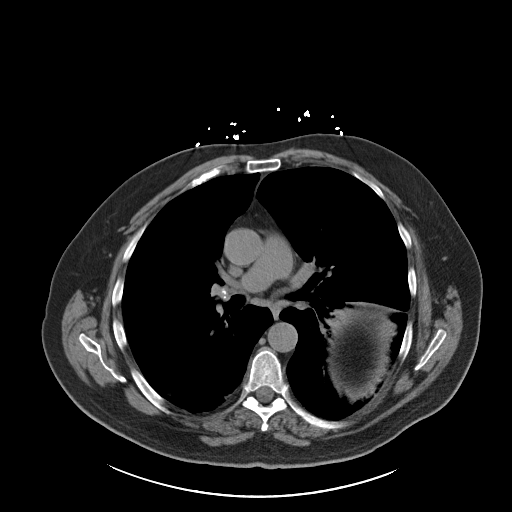

[Series 5: coronal st · coronal · 0.96mm/px · 3 of 196 slices shown]
[im 66/196  soft-tissue]
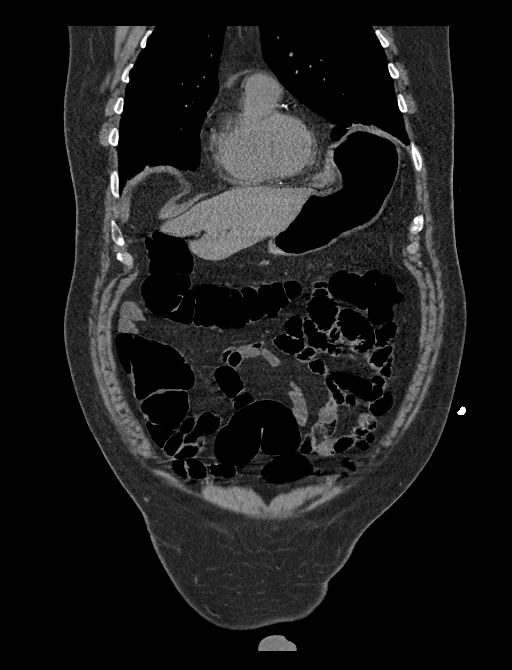
[im 87/196  soft-tissue]
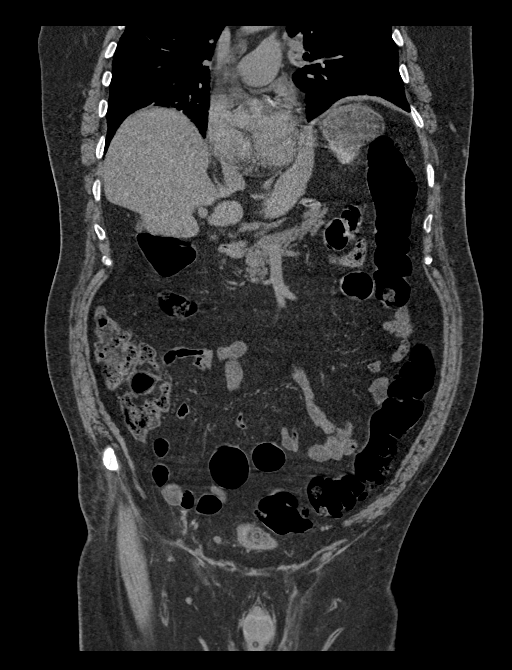
[im 109/196  soft-tissue]
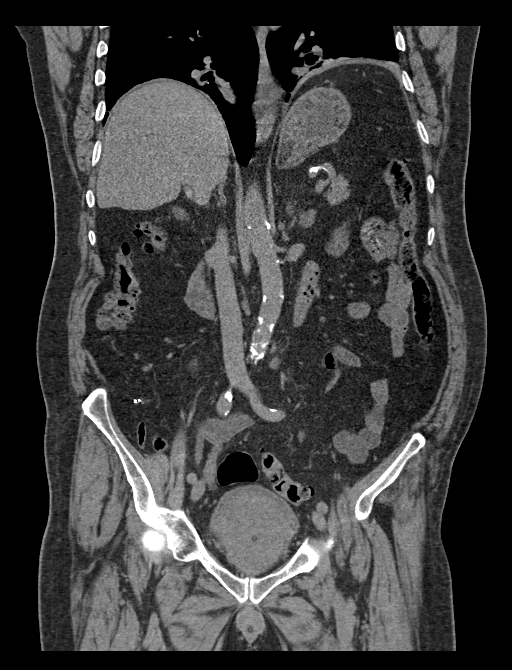

[16 of 46 positions shown; findings below may reference images not displayed]

FINDINGS: Evaluation of this exam is limited in the absence of intravenous
contrast.

Lower chest: There is eventration of the diaphragms with minimal
bibasilar atelectasis. There is coronary vascular calcifications.
Calcified mediastinal granuloma.

No intra-abdominal free air or free fluid.

Hepatobiliary: The liver is unremarkable. No intrahepatic biliary
dilatation. The gallbladder is unremarkable.

Pancreas: Unremarkable. No pancreatic ductal dilatation or
surrounding inflammatory changes.

Spleen: Normal in size without focal abnormality.

Adrenals/Urinary Tract: The adrenal glands unremarkable. A 15 mm
high attenuating lesion in the inferior pole of the left kidney is
not characterized but may represent a proteinaceous or complex cyst.
This can be better evaluated with ultrasound on a
nonemergent/outpatient basis. There is no hydronephrosis or
nephrolithiasis on either side. The visualized ureters appear
unremarkable. The urinary bladder is decompressed around a Foley
catheter.

Stomach/Bowel: There is no bowel obstruction or active inflammation.
There is sigmoid diverticulosis and scattered colonic diverticula
without active inflammatory changes. Appendectomy.

Vascular/Lymphatic: Moderate aortoiliac atherosclerotic disease. The
IVC is unremarkable. No portal venous gas. There is no adenopathy.

Reproductive: Enlarged prostate gland measuring 7 cm in transverse
axial diameter. There is median lobe hypertrophy with compression
and indentation of the base of the bladder.

Other: None

Musculoskeletal: Degenerative changes of the spine. No acute osseous
pathology.
IMPRESSION: 1. No acute intra-abdominal or pelvic pathology. No hydronephrosis
or nephrolithiasis.
2. Enlarged prostate gland with median lobe hypertrophy with
compression and indentation of the base of the bladder.
3. Colonic diverticulosis.
4. Aortic Atherosclerosis ([id number]).

## 2022-04-12 ENCOUNTER — Other Ambulatory Visit: Payer: Self-pay | Admitting: Internal Medicine

## 2022-04-27 DIAGNOSIS — R3912 Poor urinary stream: Secondary | ICD-10-CM | POA: Diagnosis not present

## 2022-05-31 ENCOUNTER — Other Ambulatory Visit: Payer: Self-pay | Admitting: Internal Medicine

## 2022-06-07 ENCOUNTER — Ambulatory Visit: Payer: Medicare Other

## 2022-06-15 ENCOUNTER — Other Ambulatory Visit: Payer: Medicare Other | Admitting: Internal Medicine

## 2022-06-16 ENCOUNTER — Encounter: Payer: Self-pay | Admitting: Family Medicine

## 2022-06-16 ENCOUNTER — Ambulatory Visit (INDEPENDENT_AMBULATORY_CARE_PROVIDER_SITE_OTHER): Payer: Medicare Other | Admitting: Family Medicine

## 2022-06-16 VITALS — BP 124/79 | HR 69 | Temp 97.4°F | Resp 16 | Ht 74.0 in | Wt 254.8 lb

## 2022-06-16 DIAGNOSIS — L03116 Cellulitis of left lower limb: Secondary | ICD-10-CM | POA: Diagnosis not present

## 2022-06-16 DIAGNOSIS — R600 Localized edema: Secondary | ICD-10-CM | POA: Diagnosis not present

## 2022-06-16 MED ORDER — FUROSEMIDE 20 MG PO TABS: 20.0000 mg | ORAL_TABLET | Freq: Every day | ORAL | 0 refills | Status: DC

## 2022-06-16 MED ORDER — CEPHALEXIN 500 MG PO CAPS: 500.0000 mg | ORAL_CAPSULE | Freq: Four times a day (QID) | ORAL | 0 refills | Status: AC

## 2022-06-16 NOTE — Progress Notes (Signed)
Acute Office Visit  Subjective:     Patient ID: Nicholas Burgess, male    DOB: 1941-05-03, 81 y.o.   MRN: [id number]  Chief Complaint  Patient presents with   spot on left leg    Blister     HPI Patient is in today for leg lesion  About 2-3 days ago, patient noticed a blister and raised areas to his left lower leg. He denies any pain, itching, drainage. Reports leg is swollen, but this is chronic for him. Patient denies any chest pain, palpitations, dyspnea, wheezing, recurrent headaches, vision changes. Denies any recent new exposures to environmental elements, insects, detergents/soaps, etc.      ROS All review of systems negative except what is listed in the HPI      Objective:    BP 124/79   Pulse 69   Temp (!) 97.4 F (36.3 C)   Resp 16   Ht '6\' 2"'$  (1.88 m)   Wt 254 lb 12.8 oz (115.6 kg)   SpO2 92%   BMI 32.71 kg/m    Physical Exam Vitals reviewed.  Constitutional:      Appearance: Normal appearance.  Cardiovascular:     Rate and Rhythm: Normal rate and regular rhythm.     Pulses: Normal pulses.  Pulmonary:     Effort: Pulmonary effort is normal.     Breath sounds: Normal breath sounds. No wheezing, rhonchi or rales.  Musculoskeletal:     Left lower leg: Edema present.  Skin:    General: Skin is warm and dry.     Findings: Erythema present.     Comments: See pictures below  Neurological:     Mental Status: He is alert.  Psychiatric:        Mood and Affect: Mood normal.        Behavior: Behavior normal.        Thought Content: Thought content normal.        Judgment: Judgment normal.      Posterior left lower leg   Anterior left lower leg    No results found for any visits on 06/16/22.      Assessment & Plan:   Problem List Items Addressed This Visit   None Visit Diagnoses     Cellulitis of left lower extremity    -  Primary   Relevant Medications   cephALEXin (KEFLEX) 500 MG capsule   Lower leg edema       Relevant  Medications   furosemide (LASIX) 20 MG tablet   Other Relevant Orders   US Venous Img Lower Unilateral Left (DVT)  Treating infection with antibiotics.  About 4 years since last documented venous US, repeat ordered Try 3 days of lasix for edema Keep area clean, pat dry, monitor for worsening symptoms Wound/edema check Monday  (Consulted with Dr. Lorelei Pont, PCP not available at this time)     Meds ordered this encounter  Medications   cephALEXin (KEFLEX) 500 MG capsule    Sig: Take 1 capsule (500 mg total) by mouth 4 (four) times daily for 5 days.    Dispense:  20 capsule    Refill:  0    Order Specific Question:   Supervising Provider    Answer:   Penni Homans A [[id number]]   furosemide (LASIX) 20 MG tablet    Sig: Take 1 tablet (20 mg total) by mouth daily for 3 days.    Dispense:  3 tablet    Refill:  0  Order Specific Question:   Supervising Provider    Answer:   Mosie Lukes [[id number]]    Return in about 5 days (around 06/21/2022) for cellulitis follow-up.  Terrilyn Saver, NP

## 2022-06-16 NOTE — Patient Instructions (Addendum)
Treating infection with antibiotics.  About 4 years since last documented venous US, repeat ordered Try 3 days of lasix for edema Keep area clean, pat dry, monitor for worsening symptoms Wound/edema check Monday

## 2022-06-21 ENCOUNTER — Encounter: Payer: Self-pay | Admitting: Internal Medicine

## 2022-06-21 ENCOUNTER — Ambulatory Visit (INDEPENDENT_AMBULATORY_CARE_PROVIDER_SITE_OTHER): Payer: Medicare Other | Admitting: Internal Medicine

## 2022-06-21 ENCOUNTER — Telehealth (HOSPITAL_BASED_OUTPATIENT_CLINIC_OR_DEPARTMENT_OTHER): Payer: Self-pay

## 2022-06-21 VITALS — BP 134/72 | HR 62 | Temp 97.7°F | Resp 18 | Ht 74.0 in | Wt 253.4 lb

## 2022-06-21 DIAGNOSIS — R21 Rash and other nonspecific skin eruption: Secondary | ICD-10-CM

## 2022-06-21 MED ORDER — BETAMETHASONE DIPROPIONATE AUG 0.05 % EX CREA: TOPICAL_CREAM | Freq: Two times a day (BID) | CUTANEOUS | 0 refills | Status: DC

## 2022-06-21 NOTE — Progress Notes (Signed)
   Subjective:    Patient ID: Nicholas Burgess, male    DOB: 01-03-1941, 81 y.o.   MRN: [id number]  DOS:  06/21/2022 Type of visit - description: f/u  The patient's son learned about the rash approximately June 13, 2022. It is not clear for how long has the rash been there since the patient has dementia. Patient has not reports any pain, itching, fever or chills. Was seen at this office few days ago, prescribed an antibiotic. Here for follow-up. Tolerating ABX well. Some areas look better with no blisters.   Review of Systems See above   Past Medical History:  Diagnosis Date   Diverticulosis of colon    Erectile dysfunction due to arterial insufficiency    Hematospermia    Hx of colonic polyps 2007   Hyperlipidemia    Hypertension    Syncope 2002    Past Surgical History:  Procedure Laterality Date   APPENDECTOMY  1992   CATARACT EXTRACTION Bilateral 06/13/2017   COLONOSCOPY W/ POLYPECTOMY  2007 & 2012   Dr.Perry ; hyperplastic   INGUINAL HERNIA REPAIR  2009   bilateral   MANDIBLE FRACTURE SURGERY  1964    Current Outpatient Medications  Medication Instructions   atorvastatin (LIPITOR) 40 mg, Oral, Daily at bedtime   cephALEXin (KEFLEX) 500 mg, Oral, 4 times daily   donepezil (ARICEPT) 10 mg, Oral, Daily at bedtime   finasteride (PROSCAR) 5 mg, Oral, Daily   furosemide (LASIX) 20 mg, Oral, Daily   memantine (NAMENDA) 5 mg, Oral, 2 times daily   metoprolol succinate (TOPROL-XL) 100 MG 24 hr tablet TAKE 1 AND 1/2 TABLETS (150 MG TOTAL) BY MOUTH DAILY.   QUEtiapine (SEROQUEL) 25 mg, Oral, Daily at bedtime   sertraline (ZOLOFT) 25 mg, Oral, Daily   tamsulosin (FLOMAX) 0.4 mg, Oral, Daily at bedtime       Objective:   Physical Exam BP 134/72   Pulse 62   Temp 97.7 F (36.5 C) (Oral)   Resp 18   Ht '6\' 2"'$  (1.88 m)   Wt 253 lb 6 oz (114.9 kg)   SpO2 94%   BMI 32.53 kg/m  General:   Well developed, NAD, BMI noted. HEENT:  Normocephalic . Face symmetric,  atraumatic Skin: See pictures left leg.  Rash on the posterior legs is hard, nontender Neurologic:  alert & oriented X3.  Speech normal, gait appropriate for age and unassisted Psych--  Cognition and judgment appear intact.  Cooperative with normal attention span and concentration.  Behavior appropriate. No anxious or depressed appearing.         Assessment      Assessment  Prediabetes HTN dc amlodipine 05-2015 dt edema Leg edema, worse on the left, neg [place] for  DVT 01/2018 Hyperlipidemia Urology -Hematospermia 1 ( 12-2014) -- saw urology 09-2015, RTC prn -BPH, LUTS, urinary retention status post Foley catheter 07-2021 Syncope 2002 Skin cancer  [facility] MMSE 21 (10-2019), rx aricept  B12 deficiency  PLAN Rash: Have a rash with no apparent itching, pain, fever.  Etiology unclear. No evidence of cellulitis today. Rash, particularly at the posterior leg is linear, make me think of contact dermatitis Plan: Finish antibiotics, strong topical steroids times days, then observation.  Call if not gradually back to normal in 2 to 3 weeks.

## 2022-06-21 NOTE — Patient Instructions (Signed)
Apply the cream twice daily for 10 days  Do not apply the cream over the open sore.  After 10 days keep the area clean and dry  Is not getting back to normal in the next 2 to 3 weeks let us know

## 2022-06-21 NOTE — Assessment & Plan Note (Signed)
Rash: Have a rash with no apparent itching, pain, fever.  Etiology unclear. No evidence of cellulitis today. Rash, particularly at the posterior leg is linear, make me think of contact dermatitis Plan: Finish antibiotics, strong topical steroids times days, then observation.  Call if not gradually back to normal in 2 to 3 weeks.

## 2022-07-13 ENCOUNTER — Other Ambulatory Visit: Payer: Self-pay | Admitting: Internal Medicine

## 2022-07-19 ENCOUNTER — Other Ambulatory Visit: Payer: Self-pay | Admitting: Internal Medicine

## 2022-09-29 ENCOUNTER — Ambulatory Visit: Payer: Medicare Other | Admitting: Internal Medicine

## 2022-10-06 ENCOUNTER — Encounter: Payer: Self-pay | Admitting: Internal Medicine

## 2022-10-06 ENCOUNTER — Ambulatory Visit (INDEPENDENT_AMBULATORY_CARE_PROVIDER_SITE_OTHER): Payer: Medicare Other | Admitting: Internal Medicine

## 2022-10-06 VITALS — BP 126/64 | HR 53 | Temp 97.6°F | Resp 18 | Ht 74.0 in | Wt 256.2 lb

## 2022-10-06 DIAGNOSIS — I10 Essential (primary) hypertension: Secondary | ICD-10-CM

## 2022-10-06 DIAGNOSIS — G309 Alzheimer's disease, unspecified: Secondary | ICD-10-CM | POA: Diagnosis not present

## 2022-10-06 DIAGNOSIS — I872 Venous insufficiency (chronic) (peripheral): Secondary | ICD-10-CM

## 2022-10-06 DIAGNOSIS — F02B0 Dementia in other diseases classified elsewhere, moderate, without behavioral disturbance, psychotic disturbance, mood disturbance, and anxiety: Secondary | ICD-10-CM

## 2022-10-06 DIAGNOSIS — E785 Hyperlipidemia, unspecified: Secondary | ICD-10-CM | POA: Diagnosis not present

## 2022-10-06 LAB — CBC WITH DIFFERENTIAL/PLATELET
Basophils Absolute: 0 10*3/uL (ref 0.0–0.1)
Basophils Relative: 0.5 % (ref 0.0–3.0)
Eosinophils Absolute: 0.3 10*3/uL (ref 0.0–0.7)
Eosinophils Relative: 4.1 % (ref 0.0–5.0)
HCT: 47.6 % (ref 39.0–52.0)
Hemoglobin: 16.4 g/dL (ref 13.0–17.0)
Lymphocytes Relative: 28.6 % (ref 12.0–46.0)
Lymphs Abs: 2 10*3/uL (ref 0.7–4.0)
MCHC: 34.4 g/dL (ref 30.0–36.0)
MCV: 92.3 fl (ref 78.0–100.0)
Monocytes Absolute: 0.6 10*3/uL (ref 0.1–1.0)
Monocytes Relative: 8.8 % (ref 3.0–12.0)
Neutro Abs: 4 10*3/uL (ref 1.4–7.7)
Neutrophils Relative %: 58 % (ref 43.0–77.0)
Platelets: 173 10*3/uL (ref 150.0–400.0)
RBC: 5.15 Mil/uL (ref 4.22–5.81)
RDW: 13.4 % (ref 11.5–15.5)
WBC: 6.9 10*3/uL (ref 4.0–10.5)

## 2022-10-06 LAB — LIPID PANEL
Cholesterol: 139 mg/dL (ref 0–200)
HDL: 37.5 mg/dL — ABNORMAL LOW (ref >39.00)
LDL Cholesterol: 67 mg/dL (ref 0–99)
NonHDL: 101.11
Total CHOL/HDL Ratio: 4
Triglycerides: 172 mg/dL — ABNORMAL HIGH (ref 0.0–149.0)
VLDL: 34.4 mg/dL (ref 0.0–40.0)

## 2022-10-06 LAB — BASIC METABOLIC PANEL
BUN: 17 mg/dL (ref 6–23)
CO2: 31 mEq/L (ref 19–32)
Calcium: 9.2 mg/dL (ref 8.4–10.5)
Chloride: 102 mEq/L (ref 96–112)
Creatinine, Ser: 0.9 mg/dL (ref 0.40–1.50)
GFR: 80.74 mL/min (ref >60.00)
Glucose, Bld: 104 mg/dL — ABNORMAL HIGH (ref 70–99)
Potassium: 4.5 mEq/L (ref 3.5–5.1)
Sodium: 139 mEq/L (ref 135–145)

## 2022-10-06 NOTE — Patient Instructions (Addendum)
Vaccines I recommend: Tdap (tetanus) RSV vaccine  Check the  blood pressure regularly BP GOAL is between 110/65 and  135/85. If it is consistently higher or lower, let me know    GO TO THE LAB : Get the blood work     [place], [place] back for   a checkup in 4 to 5 months

## 2022-10-06 NOTE — Progress Notes (Unsigned)
Subjective:    Patient ID: Nicholas Burgess, male    DOB: 11-13-1940, 82 y.o.   MRN: [id number]  DOS:  10/06/2022 Type of visit - description: Follow-up, here with his son  Since the last office visit things are doing well. Dementia is controlled with no behavioral issues, no wandering. He is somewhat hypoactive and sedentary but overall no concerns.   Review of Systems See above   Past Medical History:  Diagnosis Date   Diverticulosis of colon    Erectile dysfunction due to arterial insufficiency    Hematospermia    Hx of colonic polyps 2007   Hyperlipidemia    Hypertension    Syncope 2002    Past Surgical History:  Procedure Laterality Date   APPENDECTOMY  1992   CATARACT EXTRACTION Bilateral 06/13/2017   COLONOSCOPY W/ POLYPECTOMY  2007 & 2012   Dr.Perry ; hyperplastic   INGUINAL HERNIA REPAIR  2009   bilateral   MANDIBLE FRACTURE SURGERY  1964    Current Outpatient Medications  Medication Instructions   atorvastatin (LIPITOR) 40 mg, Oral, Daily at bedtime   augmented betamethasone dipropionate (DIPROLENE-AF) 0.05 % cream Topical, 2 times daily   donepezil (ARICEPT) 10 mg, Oral, Daily at bedtime   finasteride (PROSCAR) 5 mg, Oral, Daily   furosemide (LASIX) 20 mg, Oral, Daily   memantine (NAMENDA) 5 mg, Oral, 2 times daily   metoprolol succinate (TOPROL-XL) 150 mg, Oral, Daily   QUEtiapine (SEROQUEL) 25 mg, Oral, Daily at bedtime   sertraline (ZOLOFT) 25 mg, Oral, Daily   tamsulosin (FLOMAX) 0.4 mg, Oral, Daily at bedtime       Objective:   Physical Exam BP 126/64   Pulse (!) 53   Temp 97.6 F (36.4 C) (Oral)   Resp 18   Ht 6\' 2"  (1.88 m)   Wt 256 lb 4 oz (116.2 kg)   SpO2 96%   BMI 32.90 kg/m  General:   Well developed, NAD, BMI noted. HEENT:  Normocephalic . Face symmetric, atraumatic Lungs:  CTA B Normal respiratory effort, no intercostal retractions, no accessory muscle use. Heart: RRR,  no murmur.  Lower extremities: no pretibial edema  bilaterally  Skin: L pretibial area: Has a patch of dry, slightly hyperpigmented area without blisters or openings. Neurologic:  alert & oriented alert to self, partially to time and space.  Very pleasant, cooperative,speech normal, gait appropriate for age and unassisted Psych--  Behavior appropriate. No anxious or depressed appearing.      Assessment     Assessment  Prediabetes HTN dc amlodipine 05-2015 dt edema Leg edema, worse on the left, neg [place] for  DVT 01/2018 Stasis dermatitis, see OV 10/06/2022 Hyperlipidemia Urology -Hematospermia 1 ( 12-2014) -- saw urology 09-2015, RTC prn -BPH, LUTS, urinary retention status post Foley catheter 07-2021 Syncope 2002 Skin cancer  [facility] MMSE 21 (10-2019), rx aricept  B12 deficiency  PLAN Prediabetes, last A1c very good HTN: Currently on Lasix.  Well-controlled.  Check BMP and CBC Stasis dermatitis: Has a patch of hyperpigmented dry skin at the left pretibial area.  Observation. Hyperlipidemia: On Lipitor, last LFTs okay, check FLP. Urology: Saw Dr. Cherre Blanc Dementia, currently well-controlled on Aricept, Namenda.  Behavior doing well on Seroquel and Zoloft. No bladder or bowel incontinence, able to feed and dress himself.  No wandering. RTC 4 to 5 months.     Rash: Have a rash with no apparent itching, pain, fever.  Etiology unclear. No evidence of cellulitis today. Rash, particularly at the posterior  leg is linear, make me think of contact dermatitis Plan: Finish antibiotics, strong topical steroids times days, then observation.  Call if not gradually back to normal in 2 to 3 weeks.

## 2022-10-07 NOTE — Assessment & Plan Note (Signed)
Prediabetes, last A1c very good HTN: Currently on Lasix.  Well-controlled.  Check BMP and CBC Stasis dermatitis: Has a patch of hyperpigmented dry skin at the left pretibial area.  Observation. Hyperlipidemia: On Lipitor, last LFTs okay, check FLP. Urology: Saw Dr. Abner Greenspan 04-2022 Dementia, currently well-controlled on Aricept, Namenda.  Behavior doing well on Seroquel and Zoloft. No bladder or bowel incontinence, able to feed and dress himself.  No wandering. RTC 4 to 5 months.

## 2022-10-18 ENCOUNTER — Other Ambulatory Visit: Payer: Self-pay | Admitting: Internal Medicine

## 2022-12-09 ENCOUNTER — Other Ambulatory Visit: Payer: Self-pay | Admitting: Internal Medicine

## 2022-12-10 ENCOUNTER — Other Ambulatory Visit: Payer: Self-pay | Admitting: Internal Medicine

## 2022-12-21 ENCOUNTER — Ambulatory Visit (INDEPENDENT_AMBULATORY_CARE_PROVIDER_SITE_OTHER): Payer: Medicare Other | Admitting: *Deleted

## 2022-12-21 DIAGNOSIS — Z0000 Encounter for general adult medical examination without abnormal findings: Secondary | ICD-10-CM

## 2022-12-21 NOTE — Patient Instructions (Signed)
Nicholas Burgess , Thank you for taking time to come for your Medicare Wellness Visit. I appreciate your ongoing commitment to your health goals. Please review the following plan we discussed and let me know if I can assist you in the future.     This is a list of the screening recommended for you and due dates:  Health Maintenance  Topic Date Due   DTaP/Tdap/Td vaccine (1 - Tdap) 01/11/2013   Flu Shot  02/10/2023   Medicare Annual Wellness Visit  12/21/2023   Pneumonia Vaccine  Completed   HPV Vaccine  Aged Out   COVID-19 Vaccine  Discontinued   Hepatitis C Screening  Discontinued   Zoster (Shingles) Vaccine  Discontinued    Next appointment: Follow up in one year for your annual wellness visit.   Preventive Care 81 Years and Older, Male Preventive care refers to lifestyle choices and visits with your health care provider that can promote health and wellness. What does preventive care include? A yearly physical exam. This is also called an annual well check. Dental exams once or twice a year. Routine eye exams. Ask your health care provider how often you should have your eyes checked. Personal lifestyle choices, including: Daily care of your teeth and gums. Regular physical activity. Eating a healthy diet. Avoiding tobacco and drug use. Limiting alcohol use. Practicing safe sex. Taking low doses of aspirin every day. Taking vitamin and mineral supplements as recommended by your health care provider. What happens during an annual well check? The services and screenings done by your health care provider during your annual well check will depend on your age, overall health, lifestyle risk factors, and family history of disease. Counseling  Your health care provider may ask you questions about your: Alcohol use. Tobacco use. Drug use. Emotional well-being. Home and relationship well-being. Sexual activity. Eating habits. History of falls. Memory and ability to understand  (cognition). Work and work Astronomer. Screening  You may have the following tests or measurements: Height, weight, and BMI. Blood pressure. Lipid and cholesterol levels. These may be checked every 5 years, or more frequently if you are over 76 years old. Skin check. Lung cancer screening. You may have this screening every year starting at age 79 if you have a 30-pack-year history of smoking and currently smoke or have quit within the past 15 years. Fecal occult blood test (FOBT) of the stool. You may have this test every year starting at age 77. Flexible sigmoidoscopy or colonoscopy. You may have a sigmoidoscopy every 5 years or a colonoscopy every 10 years starting at age 69. Prostate cancer screening. Recommendations will vary depending on your family history and other risks. Hepatitis C blood test. Hepatitis B blood test. Sexually transmitted disease (STD) testing. Diabetes screening. This is done by checking your blood sugar (glucose) after you have not eaten for a while (fasting). You may have this done every 1-3 years. Abdominal aortic aneurysm (AAA) screening. You may need this if you are a current or former smoker. Osteoporosis. You may be screened starting at age 62 if you are at high risk. Talk with your health care provider about your test results, treatment options, and if necessary, the need for more tests. Vaccines  Your health care provider may recommend certain vaccines, such as: Influenza vaccine. This is recommended every year. Tetanus, diphtheria, and acellular pertussis (Tdap, Td) vaccine. You may need a Td booster every 10 years. Zoster vaccine. You may need this after age 53. Pneumococcal 13-valent conjugate (  PCV13) vaccine. One dose is recommended after age 39. Pneumococcal polysaccharide (PPSV23) vaccine. One dose is recommended after age 47. Talk to your health care provider about which screenings and vaccines you need and how often you need them. This  information is not intended to replace advice given to you by your health care provider. Make sure you discuss any questions you have with your health care provider. Document Released: 07/25/2015 Document Revised: 03/17/2016 Document Reviewed: 04/29/2015 Elsevier Interactive Patient Education  2017 [employer].  Fall Prevention in the Home Falls can cause injuries. They can happen to people of all ages. There are many things you can do to make your home safe and to help prevent falls. What can I do on the outside of my home? Regularly fix the edges of walkways and driveways and fix any cracks. Remove anything that might make you trip as you walk through a door, such as a raised step or threshold. Trim any bushes or trees on the path to your home. Use bright outdoor lighting. Clear any walking paths of anything that might make someone trip, such as rocks or tools. Regularly check to see if handrails are loose or broken. Make sure that both sides of any steps have handrails. Any raised decks and porches should have guardrails on the edges. Have any leaves, snow, or ice cleared regularly. Use sand or salt on walking paths during winter. Clean up any spills in your garage right away. This includes oil or grease spills. What can I do in the bathroom? Use night lights. Install grab bars by the toilet and in the tub and shower. Do not use towel bars as grab bars. Use non-skid mats or decals in the tub or shower. If you need to sit down in the shower, use a plastic, non-slip stool. Keep the floor dry. Clean up any water that spills on the floor as soon as it happens. Remove soap buildup in the tub or shower regularly. Attach bath mats securely with double-sided non-slip rug tape. Do not have throw rugs and other things on the floor that can make you trip. What can I do in the bedroom? Use night lights. Make sure that you have a light by your bed that is easy to reach. Do not use any sheets or  blankets that are too big for your bed. They should not hang down onto the floor. Have a firm chair that has side arms. You can use this for support while you get dressed. Do not have throw rugs and other things on the floor that can make you trip. What can I do in the kitchen? Clean up any spills right away. Avoid walking on wet floors. Keep items that you use a lot in easy-to-reach places. If you need to reach something above you, use a strong step stool that has a grab bar. Keep electrical cords out of the way. Do not use floor polish or wax that makes floors slippery. If you must use wax, use non-skid floor wax. Do not have throw rugs and other things on the floor that can make you trip. What can I do with my stairs? Do not leave any items on the stairs. Make sure that there are handrails on both sides of the stairs and use them. Fix handrails that are broken or loose. Make sure that handrails are as long as the stairways. Check any carpeting to make sure that it is firmly attached to the stairs. Fix any carpet that is  loose or worn. Avoid having throw rugs at the top or bottom of the stairs. If you do have throw rugs, attach them to the floor with carpet tape. Make sure that you have a light switch at the top of the stairs and the bottom of the stairs. If you do not have them, ask someone to add them for you. What else can I do to help prevent falls? Wear shoes that: Do not have high heels. Have rubber bottoms. Are comfortable and fit you well. Are closed at the toe. Do not wear sandals. If you use a stepladder: Make sure that it is fully opened. Do not climb a closed stepladder. Make sure that both sides of the stepladder are locked into place. Ask someone to hold it for you, if possible. Clearly mark and make sure that you can see: Any grab bars or handrails. First and last steps. Where the edge of each step is. Use tools that help you move around (mobility aids) if they are  needed. These include: Canes. Walkers. Scooters. Crutches. Turn on the lights when you go into a dark area. Replace any light bulbs as soon as they burn out. Set up your furniture so you have a clear path. Avoid moving your furniture around. If any of your floors are uneven, fix them. If there are any pets around you, be aware of where they are. Review your medicines with your doctor. Some medicines can make you feel dizzy. This can increase your chance of falling. Ask your doctor what other things that you can do to help prevent falls. This information is not intended to replace advice given to you by your health care provider. Make sure you discuss any questions you have with your health care provider. Document Released: 04/24/2009 Document Revised: 12/04/2015 Document Reviewed: 08/02/2014 Elsevier Interactive Patient Education  2017 [employer].

## 2022-12-21 NOTE — Progress Notes (Signed)
Subjective:   Nicholas Burgess is a 82 y.o. male who presents for Medicare Annual/Subsequent preventive examination.  I connected with  [name] on 12/21/22 by a audio enabled telemedicine application and verified that I am speaking with the correct person using two identifiers.  Patient Location: Home  Provider Location: Office/Clinic  I discussed the limitations of evaluation and management by telemedicine. The patient expressed understanding and agreed to proceed.   Review of Systems     Cardiac Risk Factors include: advanced age (>3men, >63 women);dyslipidemia;hypertension     Objective:    Today's Vitals   There is no height or weight on file to calculate BMI.     12/21/2022    9:40 AM 09/07/2021   11:07 AM 07/17/2021   10:47 PM 06/01/2021    3:02 PM 01/04/2020    9:39 AM 01/04/2019    9:09 AM 02/09/2017    9:06 AM  Advanced Directives  Does Patient Have a Medical Advance Directive? Yes No No Yes Yes Yes No  Type of Estate agent of [place];Living will;Out of facility DNR (pink MOST or yellow form)   Healthcare Power of [employer] of [place];Living will Living will   Does patient want to make changes to medical advance directive?     No - Patient declined No - Patient declined   Copy of Healthcare Power of Attorney in Chart? Yes - validated most recent copy scanned in chart (See row information)   No - copy requested No - copy requested    Would patient like information on creating a medical advance directive?  No - Patient declined No - Patient declined    No - Patient declined    Current Medications (verified) Outpatient Encounter Medications as of 12/21/2022  Medication Sig   atorvastatin (LIPITOR) 40 MG tablet Take 1 tablet (40 mg total) by mouth at bedtime.   donepezil (ARICEPT) 10 MG tablet Take 1 tablet (10 mg total) by mouth at bedtime.   finasteride (PROSCAR) 5 MG tablet Take 1 tablet (5 mg total) by mouth daily.    furosemide (LASIX) 20 MG tablet Take 1 tablet (20 mg total) by mouth daily for 3 days.   memantine (NAMENDA) 5 MG tablet TAKE 1 TABLET BY MOUTH 2 TIMES DAILY.   metoprolol succinate (TOPROL-XL) 100 MG 24 hr tablet Take 1.5 tablets (150 mg total) by mouth daily.   QUEtiapine (SEROQUEL) 25 MG tablet TAKE 1 TABLET (25 MG TOTAL) BY MOUTH AT BEDTIME.   sertraline (ZOLOFT) 25 MG tablet TAKE 1 TABLET (25 MG TOTAL) BY MOUTH DAILY.   tamsulosin (FLOMAX) 0.4 MG CAPS capsule Take 0.4 mg by mouth at bedtime.   [DISCONTINUED] augmented betamethasone dipropionate (DIPROLENE-AF) 0.05 % cream Apply topically 2 (two) times daily. (Patient not taking: Reported on 10/06/2022)   No facility-administered encounter medications on file as of 12/21/2022.    Allergies (verified) Patient has no known allergies.   History: Past Medical History:  Diagnosis Date   Diverticulosis of colon    Erectile dysfunction due to arterial insufficiency    Hematospermia    Hx of colonic polyps 2007   Hyperlipidemia    Hypertension    Syncope 2002   Past Surgical History:  Procedure Laterality Date   APPENDECTOMY  1992   CATARACT EXTRACTION Bilateral 06/13/2017   COLONOSCOPY W/ POLYPECTOMY  2007 & 2012   Dr.Perry ; hyperplastic   INGUINAL HERNIA REPAIR  2009   bilateral   MANDIBLE FRACTURE SURGERY  1964   Family History  Problem Relation Age of Onset   Heart failure Father    Hypertension Father    Hypertension Mother    Alzheimer's disease Mother    Cancer Mother        GYN   Diverticulitis Brother    Diabetes Sister    Heart attack Brother 36   Colon cancer Neg Hx    Stomach cancer Neg Hx    Rectal cancer Neg Hx    Stroke Neg Hx    Prostate cancer Neg Hx    Social History   Socioeconomic History   Marital status: Married    Spouse name: Not on file   Number of children: 2   Years of education: Not on file   Highest education level: Not on file  Occupational History   Occupation: retired  Conservation officer, nature: [employer]  Tobacco Use   Smoking status: Former    Types: Cigarettes    Quit date: 07/12/1980    Years since quitting: 42.4   Smokeless tobacco: Never   Tobacco comments:    smoked 1959-1982, up to 1/2 ppd  Substance and Sexual Activity   Alcohol use: Yes    Comment:  socially   Drug use: No   Sexual activity: Not on file  Other Topics Concern   Not on file  Social History Narrative   Household pt and wife; she has dementia   One child in [place], another in [place]   Social Determinants of Health   Financial Resource Strain: Low Risk  (06/01/2021)   Overall Financial Resource Strain (CARDIA)    Difficulty of Paying Living Expenses: Not hard at all  Food Insecurity: No Food Insecurity (12/21/2022)   Hunger Vital Sign    Worried About Running Out of Food in the Last Year: Never true    Ran Out of Food in the Last Year: Never true  Transportation Needs: No Transportation Needs (12/21/2022)   PRAPARE - Administrator, Civil Service (Medical): No    Lack of Transportation (Non-Medical): No  Physical Activity: Inactive (06/01/2021)   Exercise Vital Sign    Days of Exercise per Week: 0 days    Minutes of Exercise per Session: 0 min  Stress: No Stress Concern Present (06/01/2021)   [employer] of Occupational Health - Occupational Stress Questionnaire    Feeling of Stress : Not at all  Social Connections: Moderately Integrated (06/01/2021)   Social Connection and Isolation Panel [NHANES]    Frequency of Communication with Friends and Family: More than three times a week    Frequency of Social Gatherings with Friends and Family: More than three times a week    Attends Religious Services: More than 4 times per year    Active Member of [employer] or Organizations: No    Attends Banker Meetings: Never    Marital Status: Married    Tobacco Counseling Counseling given: Not Answered Tobacco comments: smoked [phone], up  to 1/2 ppd   Clinical Intake:  Pre-visit preparation completed: Yes  Pain : No/denies pain  Nutritional Risks: None Diabetes: No  How often do you need to have someone help you when you read instructions, pamphlets, or other written materials from your doctor or pharmacy?: 1 - Never   Activities of Daily Living    12/21/2022    9:44 AM  In your present state of health, do you have any difficulty performing the following activities:  Hearing? 0  Vision? 0  Difficulty concentrating or making decisions? 1  Walking or climbing stairs? 0  Dressing or bathing? 0  Doing errands, shopping? 1  Comment daughter-in-law drives him  Quarry manager and eating ? N  Using the Toilet? N  In the past six months, have you accidently leaked urine? N  Do you have problems with loss of bowel control? N  Managing your Medications? N  Managing your Finances? N  Housekeeping or managing your Housekeeping? N    Patient Care Team: Wanda Plump, MD as PCP - General (Internal Medicine) Marcine Matar, MD as Consulting Physician (Urology) Bufford Buttner, MD as Consulting Physician (Dermatology)  Indicate any recent Medical Services you may have received from other than [facility] providers in the past year (date may be approximate).     Assessment:   This is a routine wellness examination for Ignatius.  Hearing/Vision screen No results found.  Dietary issues and exercise activities discussed: Current Exercise Habits: The patient does not participate in regular exercise at present, Exercise limited by: None identified   Goals Addressed   None    Depression Screen    12/21/2022    9:43 AM 10/06/2022   10:58 AM 06/21/2022    8:46 AM 03/31/2022    8:30 AM 07/21/2021    1:05 PM 06/01/2021    3:05 PM 01/04/2020    9:42 AM  PHQ 2/9 Scores  PHQ - 2 Score 0 3 3 3  0 0 0  PHQ- 9 Score  3 3 3        Fall Risk    12/21/2022    9:42 AM 10/06/2022   10:58 AM 06/21/2022    8:46 AM 03/31/2022     8:01 AM 06/01/2021    3:03 PM  Fall Risk   Falls in the past year? 0 0 0 1 0  Number falls in past yr: 0 0 0 1 0  Injury with Fall? 0 0 0 0 0  Risk for fall due to : No Fall Risks      Follow up Falls evaluation completed Falls evaluation completed Falls evaluation completed Falls evaluation completed Falls prevention discussed    FALL RISK PREVENTION PERTAINING TO THE HOME:  Any stairs in or around the home? No  Home free of loose throw rugs in walkways, pet beds, electrical cords, etc? Yes  Adequate lighting in your home to reduce risk of falls? Yes   ASSISTIVE DEVICES UTILIZED TO PREVENT FALLS:  Life alert? No  Use of a cane, walker or w/c? No  Grab bars in the bathroom? Yes  Shower chair or bench in shower? Yes  Elevated toilet seat or a handicapped toilet? No   TIMED UP AND GO:  Was the test performed?  No, audio visit .    Cognitive Function:    12/21/2022    9:47 AM 08/26/2021    9:32 AM 10/15/2019    2:41 PM  MMSE - Mini Mental State Exam  Not completed: Unable to complete    Orientation to time  1 4  Orientation to Place  4 5  Registration  3 3  Attention/ Calculation  5 0  Recall  0 0  Language- name 2 objects  2 2  Language- repeat  1 1  Language- follow 3 step command  2 3  Language- read & follow direction  1 1  Write a sentence  1 1  Copy design  0 1  Total score  20  21        Immunizations Immunization History  Administered Date(s) Administered   PFIZER(Purple Top)SARS-COV-2 Vaccination 10/05/2019, 10/26/2019   PNEUMOCOCCAL CONJUGATE-20 11/02/2021   Pneumococcal Conjugate-13 11/26/2015   Pneumococcal Polysaccharide-23 09/20/2014   Tetanus 01/10/2013   Zoster, Live 07/09/2010    TDAP status: Due, Education has been provided regarding the importance of this vaccine. Advised may receive this vaccine at local pharmacy or Health Dept. Aware to provide a copy of the vaccination record if obtained from local pharmacy or Health Dept. Verbalized  acceptance and understanding.  Flu Vaccine status: Up to date  Pneumococcal vaccine status: Up to date  Covid-19 vaccine status: Information provided on how to obtain vaccines.   Qualifies for Shingles Vaccine? Yes   Zostavax completed Yes   Shingrix Completed?: No.    Education has been provided regarding the importance of this vaccine. Patient has been advised to call insurance company to determine out of pocket expense if they have not yet received this vaccine. Advised may also receive vaccine at local pharmacy or Health Dept. Verbalized acceptance and understanding.  Screening Tests Health Maintenance  Topic Date Due   DTaP/Tdap/Td (1 - Tdap) 01/11/2013   Medicare Annual Wellness (AWV)  06/01/2022   INFLUENZA VACCINE  02/10/2023   Pneumonia Vaccine 29+ Years old  Completed   HPV VACCINES  Aged Out   COVID-19 Vaccine  Discontinued   Hepatitis C Screening  Discontinued   Zoster Vaccines- Shingrix  Discontinued    Health Maintenance  Health Maintenance Due  Topic Date Due   DTaP/Tdap/Td (1 - Tdap) 01/11/2013   Medicare Annual Wellness (AWV)  06/01/2022    Colorectal cancer screening: No longer required.   Lung Cancer Screening: (Low Dose CT Chest recommended if Age 68-80 years, 30 pack-year currently smoking OR have quit w/in 15years.) does not qualify.    Additional Screening:  Hepatitis C Screening: does not qualify  Vision Screening: Recommended annual ophthalmology exams for early detection of glaucoma and other disorders of the eye. Is the patient up to date with their annual eye exam?  No  Who is the provider or what is the name of the office in which the patient attends annual eye exams? [facility] If pt is not established with a provider, would they like to be referred to a provider to establish care? No .   Dental Screening: Recommended annual dental exams for proper oral hygiene  Community Resource Referral / Chronic Care Management: CRR  required this visit?  No   CCM required this visit?  No      Plan:     I have personally reviewed and noted the following in the patient's chart:   Medical and social history Use of alcohol, tobacco or illicit drugs  Current medications and supplements including opioid prescriptions. Patient is not currently taking opioid prescriptions. Functional ability and status Nutritional status Physical activity Advanced directives List of other physicians Hospitalizations, surgeries, and ER visits in previous 12 months Vitals Screenings to include cognitive, depression, and falls Referrals and appointments  In addition, I have reviewed and discussed with patient certain preventive protocols, quality metrics, and best practice recommendations. A written personalized care plan for preventive services as well as general preventive health recommendations were provided to patient.   Due to this being a telephonic visit, the after visit summary with patients personalized plan was offered to patient via mail or my-chart. Patient declined at this time. Donne Anon, [facility]   12/21/2022  Nurse Notes: None

## 2023-01-24 ENCOUNTER — Other Ambulatory Visit: Payer: Self-pay | Admitting: Internal Medicine

## 2023-03-04 ENCOUNTER — Telehealth: Payer: Self-pay | Admitting: Internal Medicine

## 2023-03-04 NOTE — Telephone Encounter (Signed)
Nicholas Burgess with [facility] calling to advise they received referral for Palliative Care and they will begin services with patient.

## 2023-03-04 NOTE — Telephone Encounter (Signed)
Noted  

## 2023-03-07 ENCOUNTER — Encounter: Payer: Self-pay | Admitting: Internal Medicine

## 2023-03-08 ENCOUNTER — Ambulatory Visit: Payer: Medicare Other | Admitting: Internal Medicine

## 2023-04-25 DIAGNOSIS — R3912 Poor urinary stream: Secondary | ICD-10-CM | POA: Diagnosis not present

## 2023-05-02 ENCOUNTER — Telehealth: Payer: Self-pay | Admitting: Family

## 2023-06-06 NOTE — Telephone Encounter (Signed)
30 day supply sent, Pt overdue for appt

## 2023-06-06 NOTE — Telephone Encounter (Signed)
Routed HP due to routing error from pharmacy.

## 2023-06-07 NOTE — Telephone Encounter (Signed)
Pt was called and scheduled for follow up on 12.3.24

## 2023-06-14 ENCOUNTER — Ambulatory Visit (INDEPENDENT_AMBULATORY_CARE_PROVIDER_SITE_OTHER): Payer: Medicare Other | Admitting: Internal Medicine

## 2023-06-14 ENCOUNTER — Encounter: Payer: Self-pay | Admitting: Internal Medicine

## 2023-06-14 VITALS — BP 132/68 | HR 62 | Temp 97.5°F | Resp 18 | Ht 74.0 in | Wt 273.2 lb

## 2023-06-14 DIAGNOSIS — F02B0 Dementia in other diseases classified elsewhere, moderate, without behavioral disturbance, psychotic disturbance, mood disturbance, and anxiety: Secondary | ICD-10-CM | POA: Diagnosis not present

## 2023-06-14 DIAGNOSIS — N400 Enlarged prostate without lower urinary tract symptoms: Secondary | ICD-10-CM

## 2023-06-14 DIAGNOSIS — R739 Hyperglycemia, unspecified: Secondary | ICD-10-CM

## 2023-06-14 DIAGNOSIS — I10 Essential (primary) hypertension: Secondary | ICD-10-CM

## 2023-06-14 DIAGNOSIS — G309 Alzheimer's disease, unspecified: Secondary | ICD-10-CM

## 2023-06-14 DIAGNOSIS — E785 Hyperlipidemia, unspecified: Secondary | ICD-10-CM | POA: Diagnosis not present

## 2023-06-14 LAB — BASIC METABOLIC PANEL
BUN: 15 mg/dL (ref 6–23)
CO2: 32 meq/L (ref 19–32)
Calcium: 9.2 mg/dL (ref 8.4–10.5)
Chloride: 104 meq/L (ref 96–112)
Creatinine, Ser: 0.83 mg/dL (ref 0.40–1.50)
GFR: 82.34 mL/min (ref >60.00)
Glucose, Bld: 158 mg/dL — ABNORMAL HIGH (ref 70–99)
Potassium: 4.3 meq/L (ref 3.5–5.1)
Sodium: 139 meq/L (ref 135–145)

## 2023-06-14 LAB — HEMOGLOBIN A1C: Hgb A1c MFr Bld: 6.4 % (ref 4.6–6.5)

## 2023-06-14 MED ORDER — QUETIAPINE FUMARATE 25 MG PO TABS: 25.0000 mg | ORAL_TABLET | Freq: Every day | ORAL | 1 refills | Status: DC

## 2023-06-14 MED ORDER — ATORVASTATIN CALCIUM 40 MG PO TABS: 40.0000 mg | ORAL_TABLET | Freq: Every day | ORAL | 1 refills | Status: DC

## 2023-06-14 MED ORDER — DONEPEZIL HCL 10 MG PO TABS: 10.0000 mg | ORAL_TABLET | Freq: Every day | ORAL | 1 refills | Status: DC

## 2023-06-14 MED ORDER — SERTRALINE HCL 25 MG PO TABS: 25.0000 mg | ORAL_TABLET | Freq: Every day | ORAL | 1 refills | Status: DC

## 2023-06-14 MED ORDER — MEMANTINE HCL 5 MG PO TABS: 5.0000 mg | ORAL_TABLET | Freq: Two times a day (BID) | ORAL | 1 refills | Status: DC

## 2023-06-14 MED ORDER — METOPROLOL SUCCINATE ER 100 MG PO TB24: 150.0000 mg | ORAL_TABLET | Freq: Every day | ORAL | 1 refills | Status: DC

## 2023-06-14 NOTE — Assessment & Plan Note (Signed)
Preventive care:  Vaccine care reviewed.  - Td 2014 - PNA 2016; Prevnar 11-2015; PNM 20: 2023 -  zostavax 06/2010 - Rec: Flu shot , Tdap, covid vax, shingrix, RSV

## 2023-06-14 NOTE — Progress Notes (Signed)
Subjective:    Patient ID: Nicholas Burgess, male    DOB: 08-10-1940, 82 y.o.   MRN: [id number]  DOS:  06/14/2023 Type of visit - description: Follow-up, here with his son  Chronic issues addressed.  Dementia: Things are stable although he has "slowed down" he is no wandering, just  happy and content watching  TV and take it easy at home.  Occasionally takes a walk if the weather is okay.  Saw urology, note reviewed.  Has chronic skin irritation, would like me to look at it.  Review of Systems See above   Past Medical History:  Diagnosis Date   Diverticulosis of colon    Erectile dysfunction due to arterial insufficiency    Hematospermia    Hx of colonic polyps 2007   Hyperlipidemia    Hypertension    Syncope 2002    Past Surgical History:  Procedure Laterality Date   APPENDECTOMY  1992   CATARACT EXTRACTION Bilateral 06/13/2017   COLONOSCOPY W/ POLYPECTOMY  2007 & 2012   Dr.Perry ; hyperplastic   INGUINAL HERNIA REPAIR  2009   bilateral   MANDIBLE FRACTURE SURGERY  1964    Current Outpatient Medications  Medication Instructions   atorvastatin (LIPITOR) 40 mg, Oral, Daily at bedtime   donepezil (ARICEPT) 10 mg, Oral, Daily at bedtime   finasteride (PROSCAR) 5 mg, Oral, Daily   furosemide (LASIX) 20 mg, Oral, Daily   memantine (NAMENDA) 5 mg, Oral, 2 times daily   metoprolol succinate (TOPROL-XL) 150 mg, Oral, Daily   QUEtiapine (SEROQUEL) 25 mg, Oral, Daily at bedtime   sertraline (ZOLOFT) 25 mg, Oral, Daily   tamsulosin (FLOMAX) 0.4 mg, Oral, Daily at bedtime       Objective:   Physical Exam Skin:         Comments: At the posterior right leg has a patch of thick, scaly skin.  No open sores, no hyperpigmentation, no moles.  BP 132/68   Pulse 62   Temp (!) 97.5 F (36.4 C) (Oral)   Resp 18   Ht 6\' 2"  (1.88 m)   Wt 273 lb 4 oz (123.9 kg)   SpO2 96%   BMI 35.08 kg/m  General:   Well developed, NAD, BMI noted. HEENT:  Normocephalic . Face  symmetric, atraumatic Lungs:  CTA B Normal respiratory effort, no intercostal retractions, no accessory muscle use. Heart: RRR,  no murmur.  Lower extremities: no pretibial edema bilaterally  Skin: See graphic Neurologic:  alert & oriented to self, did not know it was Thanksgiving last week, not oriented to the year. Speech normal, gait appropriate for age and unassisted Psych--  Behavior appropriate. No anxious or depressed appearing.      Assessment     Assessment  Prediabetes HTN dc amlodipine 05-2015 dt edema Leg edema, worse on the left, neg [place] for  DVT 01/2018 Stasis dermatitis, see OV 10/06/2022 Hyperlipidemia Urology -Hematospermia 1 ( 12-2014) -- saw urology 09-2015, RTC prn -BPH, LUTS, urinary retention status post Foley catheter 07-2021 Skin cancer Dementia: [facility] MMSE 21 (10-2019), rx aricept ; MMSE 2023: 20 B12 deficiency  PLAN Prediabetes: Checking labs HTN:BP today is very good, on metoprolol, RF sent . High cholesterol: On atorvastatin, RF sent, controlled. Dementia: On Aricept, Namenda, Seroquel, Zoloft.  Seems to be doing well.  No change, RF Namenda. BPH, LUTS: Saw urology 04-2023, felt to be stable, next visit in 1 year. Stasis dermatitis: See physical exam, likely stasis dermatitis, recommend observation for now.  Preventive  care:  Vaccine care reviewed.  - Td 2014 - PNA 2016; Prevnar 11-2015; PNM 20: 2023 -  zostavax 06/2010 - Rec: Flu shot , Tdap, covid vax, shingrix, RSV RTC 4 to 6 months

## 2023-06-14 NOTE — Patient Instructions (Addendum)
Caryn Bee, please asked the front to do add you on your dad's  DPR    vaccines I recommend: Covid booster Tdap (tetanus) Flu shot RSV Shingrix     GO TO THE LAB : Get the blood work     Next visit with me in 4 to 6 months    Please schedule it at the front desk

## 2023-06-14 NOTE — Assessment & Plan Note (Signed)
Prediabetes: Checking labs HTN:BP today is very good, on metoprolol, RF sent . High cholesterol: On atorvastatin, RF sent, controlled. Dementia: On Aricept, Namenda, Seroquel, Zoloft.  Seems to be doing well.  No change, RF Namenda. BPH, LUTS: Saw urology 04-2023, felt to be stable, next visit in 1 year. Stasis dermatitis: See physical exam, likely stasis dermatitis, recommend observation for now.  Preventive care:  Vaccine care reviewed

## 2023-12-14 ENCOUNTER — Ambulatory Visit (INDEPENDENT_AMBULATORY_CARE_PROVIDER_SITE_OTHER): Payer: Medicare Other | Admitting: Internal Medicine

## 2023-12-14 ENCOUNTER — Encounter: Payer: Self-pay | Admitting: Internal Medicine

## 2023-12-14 VITALS — BP 116/72 | HR 67 | Temp 97.8°F | Resp 16 | Ht 74.0 in | Wt 270.2 lb

## 2023-12-14 DIAGNOSIS — I10 Essential (primary) hypertension: Secondary | ICD-10-CM | POA: Diagnosis not present

## 2023-12-14 DIAGNOSIS — E538 Deficiency of other specified B group vitamins: Secondary | ICD-10-CM | POA: Diagnosis not present

## 2023-12-14 DIAGNOSIS — E785 Hyperlipidemia, unspecified: Secondary | ICD-10-CM | POA: Diagnosis not present

## 2023-12-14 DIAGNOSIS — M25562 Pain in left knee: Secondary | ICD-10-CM | POA: Diagnosis not present

## 2023-12-14 DIAGNOSIS — F02B0 Dementia in other diseases classified elsewhere, moderate, without behavioral disturbance, psychotic disturbance, mood disturbance, and anxiety: Secondary | ICD-10-CM

## 2023-12-14 DIAGNOSIS — R739 Hyperglycemia, unspecified: Secondary | ICD-10-CM

## 2023-12-14 DIAGNOSIS — Z2821 Immunization not carried out because of patient refusal: Secondary | ICD-10-CM

## 2023-12-14 DIAGNOSIS — G309 Alzheimer's disease, unspecified: Secondary | ICD-10-CM

## 2023-12-14 LAB — CBC WITH DIFFERENTIAL/PLATELET
Basophils Absolute: 0 10*3/uL (ref 0.0–0.1)
Basophils Relative: 0.3 % (ref 0.0–3.0)
Eosinophils Absolute: 0.2 10*3/uL (ref 0.0–0.7)
Eosinophils Relative: 2.8 % (ref 0.0–5.0)
HCT: 46.4 % (ref 39.0–52.0)
Hemoglobin: 15.7 g/dL (ref 13.0–17.0)
Lymphocytes Relative: 21.9 % (ref 12.0–46.0)
Lymphs Abs: 1.5 10*3/uL (ref 0.7–4.0)
MCHC: 33.7 g/dL (ref 30.0–36.0)
MCV: 92.7 fl (ref 78.0–100.0)
Monocytes Absolute: 0.5 10*3/uL (ref 0.1–1.0)
Monocytes Relative: 7.3 % (ref 3.0–12.0)
Neutro Abs: 4.7 10*3/uL (ref 1.4–7.7)
Neutrophils Relative %: 67.7 % (ref 43.0–77.0)
Platelets: 193 10*3/uL (ref 150.0–400.0)
RBC: 5.01 Mil/uL (ref 4.22–5.81)
RDW: 13.4 % (ref 11.5–15.5)
WBC: 6.9 10*3/uL (ref 4.0–10.5)

## 2023-12-14 LAB — BASIC METABOLIC PANEL WITH GFR
BUN: 16 mg/dL (ref 6–23)
CO2: 28 meq/L (ref 19–32)
Calcium: 9 mg/dL (ref 8.4–10.5)
Chloride: 103 meq/L (ref 96–112)
Creatinine, Ser: 0.9 mg/dL (ref 0.40–1.50)
GFR: 80.07 mL/min (ref >60.00)
Glucose, Bld: 149 mg/dL — ABNORMAL HIGH (ref 70–99)
Potassium: 4.1 meq/L (ref 3.5–5.1)
Sodium: 138 meq/L (ref 135–145)

## 2023-12-14 LAB — LIPID PANEL
Cholesterol: 140 mg/dL (ref 0–200)
HDL: 31.6 mg/dL — ABNORMAL LOW (ref >39.00)
LDL Cholesterol: 56 mg/dL (ref 0–99)
NonHDL: 108.24
Total CHOL/HDL Ratio: 4
Triglycerides: 259 mg/dL — ABNORMAL HIGH (ref 0.0–149.0)
VLDL: 51.8 mg/dL — ABNORMAL HIGH (ref 0.0–40.0)

## 2023-12-14 LAB — B12 AND FOLATE PANEL
Folate: 9.7 ng/mL (ref >5.9)
Vitamin B-12: 200 pg/mL — ABNORMAL LOW (ref 211–911)

## 2023-12-14 LAB — ALT: ALT: 19 U/L (ref 0–53)

## 2023-12-14 LAB — HEMOGLOBIN A1C: Hgb A1c MFr Bld: 6.3 % (ref 4.6–6.5)

## 2023-12-14 LAB — AST: AST: 17 U/L (ref 0–37)

## 2023-12-14 NOTE — Patient Instructions (Signed)
 We are referring you to a sports medicine for knee pain evaluation.  Tylenol  500 mg OTC 2 tabs a day every 8 hours as needed for pain   GO TO THE LAB :  Get the blood work   Your results will be posted on MyChart with my comments  Next office visit for a physical exam in 4 months Please make an appointment before you leave today

## 2023-12-14 NOTE — Progress Notes (Unsigned)
 Subjective:    Patient ID: Nicholas Burgess, male    DOB: Oct 27, 1940, 83 y.o.   MRN: [id number]  DOS:  12/14/2023 Type of visit - description: Follow-up, here with Kaven, his own. Chronic medical problems addressed.  Short-term memory continue. He has not been noting to be more confused or depressed.  The son reports that he has lost the desire to do much, sits on his chair most day. The patient reports bilateral knee pain, the family suspect that is one of the reasons he is not very active. No recent falls, no use of a cane or a walker.  Left leg swelling, not a new issue.    Review of Systems See above   Past Medical History:  Diagnosis Date   Diverticulosis of colon    Erectile dysfunction due to arterial insufficiency    Hematospermia    Hx of colonic polyps 2007   Hyperlipidemia    Hypertension    Syncope 2002    Past Surgical History:  Procedure Laterality Date   APPENDECTOMY  1992   CATARACT EXTRACTION Bilateral 06/13/2017   COLONOSCOPY W/ POLYPECTOMY  2007 & 2012   Dr.Perry ; hyperplastic   INGUINAL HERNIA REPAIR  2009   bilateral   MANDIBLE FRACTURE SURGERY  1964    Current Outpatient Medications  Medication Instructions   atorvastatin  (LIPITOR) 40 mg, Oral, Daily at bedtime   donepezil  (ARICEPT ) 10 mg, Oral, Daily at bedtime   finasteride  (PROSCAR ) 5 mg, Oral, Daily   memantine  (NAMENDA ) 5 mg, Oral, 2 times daily   metoprolol  succinate (TOPROL -XL) 150 mg, Oral, Daily   QUEtiapine  (SEROQUEL ) 25 mg, Oral, Daily at bedtime   sertraline  (ZOLOFT ) 25 mg, Oral, Daily   tamsulosin (FLOMAX) 0.4 mg, Daily at bedtime       Objective:   Physical Exam Skin:        BP 116/72   Pulse 67   Temp 97.8 F (36.6 C) (Oral)   Resp 16   Ht 6\' 2"  (1.88 m)   Wt 270 lb 4 oz (122.6 kg)   SpO2 94%   BMI 34.70 kg/m  General:   Well developed, NAD, BMI noted. HEENT:  Normocephalic . Face symmetric, atraumatic Lungs:  CTA B Normal respiratory effort, no  intercostal retractions, no accessory muscle use. Heart: RRR,  no murmur.  Lower extremities: Knees with bony changes consistent with DJD, ROM decreased.  Mild effusion on the R?. L calf circumference is larger by half inch.  Soft, nontender. Skin: See graphic  neurologic:  alert & pleasantly demented speech normal, gait is slow but seems steady.  Unassisted. Psych--    Behavior appropriate. No anxious or depressed appearing.      Assessment   Assessment  Prediabetes HTN dc amlodipine  05-2015 dt edema Leg edema, worse on the left, neg US  for  DVT 01/2018 Stasis dermatitis, see OV 10/06/2022 Hyperlipidemia Urology -Hematospermia 1 ( 12-2014) -- saw urology 09-2015, RTC prn -BPH, LUTS, urinary retention status post Foley catheter 07-2021 Skin cancer Dementia: [facility] MMSE 21 (10-2019), rx aricept  ; MMSE 2023: 20 B12 deficiency  PLAN Hyperglycemia: Check A1c HTN: BP looks good today, no ambulatory BPs, continue metoprolol .  Checking labs High cholesterol: On atorvastatin  40 mg, checking labs. Dementia: Mentally he is stable, on Aricept , Namenda , Seroquel , Zoloft . Hypoactive, sedentary: The patient has become even more sedentary, this is probably a combination of dementia and knee pain.  See next. Knee pain: Bilateral, recommend Tylenol as needed.  Will refer to  sports medicine, perhaps a local injection will help with knee pain at least temporarily   B12 deficiency: Only taking OTC vitamin D , encouraged to take B12 supplement, checking levels.  L leg edema: At baseline.  Observation Skin lesion, left knee: Reassess on RTC to be sure is not changing. RTC 4 months CPX

## 2023-12-15 NOTE — Assessment & Plan Note (Signed)
 Hyperglycemia: Check A1c HTN: BP looks good today, no ambulatory BPs, continue metoprolol .  Checking labs High cholesterol: On atorvastatin  40 mg, checking labs. Dementia:   stable, on Aricept , Namenda , Seroquel , Zoloft . Hypoactive, sedentary: The patient has become even more sedentary, this is probably a combination of dementia and knee pain.  See next. Knee pain: Bilateral, recommend Tylenol as needed.  Will refer to sports medicine, perhaps a local injection will help with knee pain at least temporarily B12 deficiency: Only taking OTC vitamin D , encouraged to take B12 supplement, checking levels. L leg edema: At baseline.  Observation Skin lesion, left knee: Reassess on RTC to be sure is not changing. RTC 4 months CPX

## 2023-12-16 ENCOUNTER — Ambulatory Visit: Payer: Self-pay | Admitting: Internal Medicine

## 2023-12-22 ENCOUNTER — Telehealth: Payer: Self-pay

## 2023-12-22 MED ORDER — DONEPEZIL HCL 10 MG PO TABS: 10.0000 mg | ORAL_TABLET | Freq: Every day | ORAL | 1 refills | Status: DC

## 2023-12-22 MED ORDER — ATORVASTATIN CALCIUM 40 MG PO TABS: 40.0000 mg | ORAL_TABLET | Freq: Every day | ORAL | 1 refills | Status: DC

## 2023-12-22 MED ORDER — METOPROLOL SUCCINATE ER 100 MG PO TB24: 150.0000 mg | ORAL_TABLET | Freq: Every day | ORAL | 1 refills | Status: DC

## 2023-12-22 MED ORDER — QUETIAPINE FUMARATE 25 MG PO TABS
25.0000 mg | ORAL_TABLET | Freq: Every day | ORAL | 1 refills | Status: DC
Start: 2023-12-22 — End: 2024-01-09

## 2023-12-22 MED ORDER — SERTRALINE HCL 25 MG PO TABS: 25.0000 mg | ORAL_TABLET | Freq: Every day | ORAL | 1 refills | Status: DC

## 2023-12-22 MED ORDER — MEMANTINE HCL 5 MG PO TABS: 5.0000 mg | ORAL_TABLET | Freq: Two times a day (BID) | ORAL | 1 refills | Status: DC

## 2023-12-22 NOTE — Telephone Encounter (Signed)
 Copied from CRM [id number]. Topic: Clinical - Medical Advice >> Dec 21, 2023  5:13 PM Taleah C wrote: Reason for CRM: Pt's son, Polly Brink, called and stated that he wants to start to using [facility] In [place], [place] tele: [phone]. Additionally, he asked for the meds to come in pill packs. Please advise.

## 2023-12-22 NOTE — Telephone Encounter (Signed)
 Rxs sent

## 2023-12-29 ENCOUNTER — Ambulatory Visit

## 2023-12-30 ENCOUNTER — Ambulatory Visit: Admitting: *Deleted

## 2023-12-30 VITALS — Ht 74.0 in | Wt 270.0 lb

## 2023-12-30 DIAGNOSIS — Z0000 Encounter for general adult medical examination without abnormal findings: Secondary | ICD-10-CM | POA: Diagnosis not present

## 2023-12-30 NOTE — Progress Notes (Signed)
 Subjective:   Nicholas Burgess is a 83 y.o. who presents for a Medicare Wellness preventive visit.  As a reminder, Annual Wellness Visits don't include a physical exam, and some assessments may be limited, especially if this visit is performed virtually. We may recommend an in-person follow-up visit with your provider if needed.  Visit Complete: Virtual I connected with  Nicholas Burgess on 12/30/23 by a audio enabled telemedicine application and verified that I am speaking with the correct person using two identifiers.  Patient Location: Home  Provider Location: Office/Clinic  I discussed the limitations of evaluation and management by telemedicine. The patient expressed understanding and agreed to proceed.  Vital Signs: Because this visit was a virtual/telehealth visit, some criteria may be missing or patient reported. Any vitals not documented were not able to be obtained and vitals that have been documented are patient reported.  VideoDeclined- This patient declined Librarian, academic. Therefore the visit was completed with audio only.  Persons Participating in Visit: Patient assisted by spouse.  AWV Questionnaire: No: Patient Medicare AWV questionnaire was not completed prior to this visit.  Cardiac Risk Factors include: advanced age (>69men, >39 women);male gender;hypertension;dyslipidemia     Objective:    Today's Vitals   12/30/23 1341  Weight: 270 lb (122.5 kg)  Height: 6' 2 (1.88 m)   Body mass index is 34.67 kg/m.     12/30/2023    2:03 PM 12/21/2022    9:40 AM 09/07/2021   11:07 AM 07/17/2021   10:47 PM 06/01/2021    3:02 PM 01/04/2020    9:39 AM 01/04/2019    9:09 AM  Advanced Directives  Does Patient Have a Medical Advance Directive? Yes Yes No No Yes Yes Yes  Type of Advance Directive Out of facility DNR (pink MOST or yellow form) Healthcare Power of [place];Living will;Out of facility DNR (pink MOST or yellow form)   Healthcare Power  of [employer] of [place];Living will Living will  Does patient want to make changes to medical advance directive? No - Patient declined     No - Patient declined No - Patient declined   Copy of Healthcare Power of Attorney in Chart?  Yes - validated most recent copy scanned in chart (See row information)   No - copy requested No - copy requested   Would patient like information on creating a medical advance directive?   No - Patient declined No - Patient declined        Data saved with a previous flowsheet row definition    Current Medications (verified) Outpatient Encounter Medications as of 12/30/2023  Medication Sig   atorvastatin  (LIPITOR) 40 MG tablet Take 1 tablet (40 mg total) by mouth at bedtime.   donepezil  (ARICEPT ) 10 MG tablet Take 1 tablet (10 mg total) by mouth at bedtime.   finasteride  (PROSCAR ) 5 MG tablet Take 1 tablet (5 mg total) by mouth daily.   memantine  (NAMENDA ) 5 MG tablet Take 1 tablet (5 mg total) by mouth 2 (two) times daily.   metoprolol  succinate (TOPROL -XL) 100 MG 24 hr tablet Take 1.5 tablets (150 mg total) by mouth daily.   QUEtiapine  (SEROQUEL ) 25 MG tablet Take 1 tablet (25 mg total) by mouth at bedtime.   sertraline  (ZOLOFT ) 25 MG tablet Take 1 tablet (25 mg total) by mouth daily.   tamsulosin (FLOMAX) 0.4 MG CAPS capsule Take 0.4 mg by mouth at bedtime.   No facility-administered encounter medications on file as of  12/30/2023.    Allergies (verified) Patient has no known allergies.   History: Past Medical History:  Diagnosis Date   Diverticulosis of colon    Erectile dysfunction due to arterial insufficiency    Hematospermia    Hx of colonic polyps 2007   Hyperlipidemia    Hypertension    Syncope 2002   Past Surgical History:  Procedure Laterality Date   APPENDECTOMY  1992   CATARACT EXTRACTION Bilateral 06/13/2017   COLONOSCOPY W/ POLYPECTOMY  2007 & 2012   Dr.Perry ; hyperplastic   INGUINAL HERNIA REPAIR  2009    bilateral   MANDIBLE FRACTURE SURGERY  1964   Family History  Problem Relation Age of Onset   Heart failure Father    Hypertension Father    Hypertension Mother    Alzheimer's disease Mother    Cancer Mother        GYN   Diverticulitis Brother    Diabetes Sister    Heart attack Brother 88   Colon cancer Neg Hx    Stomach cancer Neg Hx    Rectal cancer Neg Hx    Stroke Neg Hx    Prostate cancer Neg Hx    Social History   Socioeconomic History   Marital status: Married    Spouse name: Not on file   Number of children: 2   Years of education: Not on file   Highest education level: Not on file  Occupational History   Occupation: retired Conservation officer, nature: [employer]  Tobacco Use   Smoking status: Former    Current packs/day: 0.00    Average packs/day: 0.5 packs/day for 23.0 years (11.5 ttl pk-yrs)    Types: Cigarettes    Start date: 36    Quit date: 07/12/1980    Years since quitting: 43.4   Smokeless tobacco: Never   Tobacco comments:    smoked 1959-1982, up to 1/2 ppd  Substance and Sexual Activity   Alcohol use: Yes    Comment:  socially   Drug use: No   Sexual activity: Not on file  Other Topics Concern   Not on file  Social History Narrative   Household pt and wife; she has dementia   No driving    One child in [place], another in [place]    Social Drivers of Health   Financial Resource Strain: Low Risk  (12/30/2023)   Overall Financial Resource Strain (CARDIA)    Difficulty of Paying Living Expenses: Not very hard  Food Insecurity: No Food Insecurity (12/30/2023)   Hunger Vital Sign    Worried About Running Out of Food in the Last Year: Never true    Ran Out of Food in the Last Year: Never true  Transportation Needs: No Transportation Needs (12/30/2023)   PRAPARE - Administrator, Civil Service (Medical): No    Lack of Transportation (Non-Medical): No  Physical Activity: Inactive (12/30/2023)   Exercise Vital Sign    Days of  Exercise per Week: 0 days    Minutes of Exercise per Session: 0 min  Stress: No Stress Concern Present (12/30/2023)   [employer] of Occupational Health - Occupational Stress Questionnaire    Feeling of Stress: Not at all  Social Connections: Moderately Integrated (12/30/2023)   Social Connection and Isolation Panel    Frequency of Communication with Friends and Family: Three times a week    Frequency of Social Gatherings with Friends and Family: Once a week    Attends  Religious Services: More than 4 times per year    Active Member of Clubs or Organizations: No    Attends Banker Meetings: Never    Marital Status: Married    Tobacco Counseling Counseling given: Not Answered Tobacco comments: smoked [phone], up to 1/2 ppd    Clinical Intake:  Pre-visit preparation completed: Yes  Pain : No/denies pain     BMI - recorded: 34.67 Nutritional Status: BMI > 30  Obese Nutritional Risks: None Diabetes: No  Lab Results  Component Value Date   HGBA1C 6.3 12/14/2023   HGBA1C 6.4 06/14/2023   HGBA1C 6.0 03/31/2022     How often do you need to have someone help you when you read instructions, pamphlets, or other written materials from your doctor or pharmacy?: 3 - Sometimes (due to alzheimer's)  Interpreter Needed?: No  Information entered by :: Susa Engman, CMA   Activities of Daily Living     12/30/2023    1:53 PM 12/14/2023   10:56 AM  In your present state of health, do you have any difficulty performing the following activities:  Hearing? 0 0  Vision? 0 0  Difficulty concentrating or making decisions? 0 1  Walking or climbing stairs? 0 0  Dressing or bathing? 0 0  Doing errands, shopping? 1 1  Comment spouse / son assists due to alzheimer's   Preparing Food and eating ? N   Comment Currently receives meals on wheels   Using the Toilet? N   In the past six months, have you accidently leaked urine? N   Do you have problems with loss of bowel  control? N   Managing your Medications? N   Managing your Finances? N   Housekeeping or managing your Housekeeping? N     Patient Care Team: Ezell Hollow, MD as PCP - General (Internal Medicine) Trent Frizzle, MD as Consulting Physician (Urology) Glory Larsen, MD as Consulting Physician (Dermatology)  I have updated your Care Teams any recent Medical Services you may have received from other providers in the past year.     Assessment:   This is a routine wellness examination for Nicholas Burgess.  Hearing/Vision screen Hearing Screening - Comments:: Denies hearing difficulties.  Vision Screening - Comments:: Pt denies difficulty, doesn't get eye exams.   Goals Addressed             This Visit's Progress    DIET - EAT MORE FRUITS AND VEGETABLES   On track    DIET - INCREASE WATER INTAKE   Not on track      Depression Screen     12/30/2023    1:51 PM 12/14/2023   10:56 AM 06/14/2023   10:31 AM 12/21/2022    9:43 AM 10/06/2022   10:58 AM 06/21/2022    8:46 AM 03/31/2022    8:30 AM  PHQ 2/9 Scores  PHQ - 2 Score 0 0 0 0 3 3 3   PHQ- 9 Score     3 3 3     Fall Risk     12/30/2023    1:46 PM 12/14/2023   10:54 AM 06/14/2023   10:30 AM 12/21/2022    9:42 AM 10/06/2022   10:58 AM  Fall Risk   Falls in the past year? 0 0 0 0 0  Number falls in past yr: 0 0 0 0 0  Injury with Fall? 0 0 0 0 0  Risk for fall due to :    No Fall Risks  Follow up Education provided Falls evaluation completed;Education provided Falls evaluation completed Falls evaluation completed Falls evaluation completed    MEDICARE RISK AT HOME:  Medicare Risk at Home Any stairs in or around the home?: No If so, are there any without handrails?: No Home free of loose throw rugs in walkways, pet beds, electrical cords, etc?: Yes Adequate lighting in your home to reduce risk of falls?: Yes Life alert?: No Use of a cane, walker or w/c?: No Grab bars in the bathroom?: Yes Shower chair or bench in shower?:  Yes Elevated toilet seat or a handicapped toilet?: No  TIMED UP AND GO:  Was the test performed?  No,audio  Cognitive Function: Impaired: Patient has current diagnosis of cognitive impairment.    12/21/2022    9:47 AM 08/26/2021    9:32 AM 10/15/2019    2:41 PM  MMSE - Mini Mental State Exam  Not completed: Unable to complete    Orientation to time  1 4  Orientation to Place  4 5  Registration  3 3  Attention/ Calculation  5 0  Recall  0 0  Language- name 2 objects  2 2  Language- repeat  1 1  Language- follow 3 step command  2 3  Language- read & follow direction  1 1  Write a sentence  1 1  Copy design  0 1  Total score  20 21        12/30/2023    2:04 PM  6CIT Screen  What Year? --    Immunizations Immunization History  Administered Date(s) Administered   PFIZER(Purple Top)SARS-COV-2 Vaccination 10/05/2019, 10/26/2019   PNEUMOCOCCAL CONJUGATE-20 11/02/2021   Pneumococcal Conjugate-13 11/26/2015   Pneumococcal Polysaccharide-23 09/20/2014   Tetanus 01/10/2013   Zoster, Live 07/09/2010    Screening Tests Health Maintenance  Topic Date Due   DTaP/Tdap/Td (1 - Tdap) 10/12/2024 (Originally 01/11/2013)   INFLUENZA VACCINE  02/10/2024   Medicare Annual Wellness (AWV)  12/29/2024   Pneumococcal Vaccine: 50+ Years  Completed   HPV VACCINES  Aged Out   Meningococcal B Vaccine  Aged Out   COVID-19 Vaccine  Discontinued   Hepatitis C Screening  Discontinued   Zoster Vaccines- Shingrix  Discontinued    Health Maintenance  There are no preventive care reminders to display for this patient.  Health Maintenance Items Addressed: All HM up to date.  Additional Screening:  Vision Screening: Recommended annual ophthalmology exams for early detection of glaucoma and other disorders of the eye. Would you like a referral to an eye doctor? No    Dental Screening: Recommended annual dental exams for proper oral hygiene  Community Resource Referral / Chronic Care  Management: CRR required this visit?  No   CCM required this visit?  No   Plan:    I have personally reviewed and noted the following in the patient's chart:   Medical and social history Use of alcohol, tobacco or illicit drugs  Current medications and supplements including opioid prescriptions. Patient is not currently taking opioid prescriptions. Functional ability and status Nutritional status Physical activity Advanced directives List of other physicians Hospitalizations, surgeries, and ER visits in previous 12 months Vitals Screenings to include cognitive, depression, and falls Referrals and appointments  In addition, I have reviewed and discussed with patient certain preventive protocols, quality metrics, and best practice recommendations. A written personalized care plan for preventive services as well as general preventive health recommendations were provided to patient.   Susa Engman, CMA  12/30/2023   After Visit Summary: (Mail) Due to this being a telephonic visit, the after visit summary with patients personalized plan was offered to patient via mail   Notes: Nothing significant to report at this time.

## 2023-12-30 NOTE — Patient Instructions (Signed)
 Mr. Tallerico , Thank you for taking time out of your busy schedule to complete your Annual Wellness Visit with me. I enjoyed our conversation and look forward to speaking with you again next year. I, as well as your care team,  appreciate your ongoing commitment to your health goals. Please review the following plan we discussed and let me know if I can assist you in the future. Your Game plan/ To Do List    Follow up Visits: Next Medicare AWV with our clinical staff: 01/02/25 1:40pm (telephone call)  Next Office Visit with your provider: 04/18/24 10:40pm Dr Neomi Banks  Clinician Recommendations:  Aim for 30 minutes of exercise or brisk walking, 6-8 glasses of water, and 5 servings of fruits and vegetables each day.       This is a list of the screening recommended for you and due dates:  Health Maintenance  Topic Date Due   Medicare Annual Wellness Visit  12/21/2023   DTaP/Tdap/Td vaccine (1 - Tdap) 10/12/2024*   Flu Shot  02/10/2024   Pneumococcal Vaccine for age over 59  Completed   HPV Vaccine  Aged Out   Meningitis B Vaccine  Aged Out   COVID-19 Vaccine  Discontinued   Hepatitis C Screening  Discontinued   Zoster (Shingles) Vaccine  Discontinued  *Topic was postponed. The date shown is not the original due date.    See attachments for Exercise and Fall Prevention Tips.

## 2024-01-09 ENCOUNTER — Other Ambulatory Visit: Payer: Self-pay | Admitting: Internal Medicine

## 2024-02-06 ENCOUNTER — Other Ambulatory Visit: Payer: Self-pay | Admitting: Internal Medicine

## 2024-02-15 ENCOUNTER — Telehealth: Payer: Self-pay

## 2024-02-15 NOTE — Telephone Encounter (Signed)
 Received fax from [facility] in [place], [place]- requesting list of Pt's meds faxed to them at [phone]. I called and spoke w/ [name] to confirm they are switching pharmacies, Nicholas Burgess informed this is accurate.   Med list faxed as requested.

## 2024-04-17 NOTE — Progress Notes (Signed)
 Pharmacy Quality Measure Review  This patient is appearing on a report for being at risk of failing the adherence measure for cholesterol (statin) medications this calendar year.   Medication: atorvastatin  40 mg daily Last fill date: 04/05/2024 for 28 day supply  Insurance report was not up to date. No action needed at this time.   Woodie Jock, PharmD PGY1 Pharmacy Resident  04/17/2024

## 2024-04-18 ENCOUNTER — Encounter: Payer: Self-pay | Admitting: Internal Medicine

## 2024-04-18 ENCOUNTER — Ambulatory Visit (INDEPENDENT_AMBULATORY_CARE_PROVIDER_SITE_OTHER): Admitting: Internal Medicine

## 2024-04-18 VITALS — BP 126/66 | HR 57 | Temp 98.0°F | Resp 16 | Ht 74.0 in | Wt 270.2 lb

## 2024-04-18 DIAGNOSIS — F01C18 Vascular dementia, severe, with other behavioral disturbance: Secondary | ICD-10-CM | POA: Diagnosis not present

## 2024-04-18 DIAGNOSIS — I10 Essential (primary) hypertension: Secondary | ICD-10-CM | POA: Diagnosis not present

## 2024-04-18 DIAGNOSIS — R269 Unspecified abnormalities of gait and mobility: Secondary | ICD-10-CM | POA: Diagnosis not present

## 2024-04-18 DIAGNOSIS — G309 Alzheimer's disease, unspecified: Secondary | ICD-10-CM

## 2024-04-18 DIAGNOSIS — F02B0 Dementia in other diseases classified elsewhere, moderate, without behavioral disturbance, psychotic disturbance, mood disturbance, and anxiety: Secondary | ICD-10-CM | POA: Diagnosis not present

## 2024-04-18 DIAGNOSIS — E538 Deficiency of other specified B group vitamins: Secondary | ICD-10-CM | POA: Diagnosis not present

## 2024-04-18 MED ORDER — CYANOCOBALAMIN 1000 MCG/ML IJ SOLN
1000.0000 ug | Freq: Once | INTRAMUSCULAR | Status: AC
  Administered 2024-04-18: 1000 ug via INTRAMUSCULAR

## 2024-04-18 MED ORDER — VITAMIN B-12 1000 MCG PO TABS: 1000.0000 ug | ORAL_TABLET | Freq: Every day | ORAL | 3 refills | Status: AC

## 2024-04-18 NOTE — Patient Instructions (Addendum)
 Go to the front desk for the checkout Please make an appointment for a checkup in 4 months     Check the  blood pressure regularly Blood pressure goal:  between 110/65 and  135/85. If it is consistently higher or lower, let me know  Vaccines I recommend Flu shot, COVID booster, RSV, Shingrix, Tdap  Start taking vitamin B12, we sent a prescription  Fall Prevention in the Home, Adult Falls can cause injuries and affect people of all ages. There are many simple things that you can do to make your home safe and to help prevent falls. If you need it, ask for help making these changes. What actions can I take to prevent falls? General information Use good lighting in all rooms. Make sure to: Replace any light bulbs that burn out. Turn on lights if it is dark and use night-lights. Keep items that you use often in easy-to-reach places. Lower the shelves around your home if needed. Move furniture so that there are clear paths around it. Do not keep throw rugs or other things on the floor that can make you trip. If any of your floors are uneven, fix them. Add color or contrast paint or tape to clearly mark and help you see: Grab bars or handrails. First and last steps of staircases. Where the edge of each step is. If you use a ladder or stepladder: Make sure that it is fully opened. Do not climb a closed ladder. Make sure the sides of the ladder are locked in place. Have someone hold the ladder while you use it. Know where your pets are as you move through your home. What can I do in the bathroom?     Keep the floor dry. Clean up any water that is on the floor right away. Remove soap buildup in the bathtub or shower. Buildup makes bathtubs and showers slippery. Use non-skid mats or decals on the floor of the bathtub or shower. Attach bath mats securely with double-sided, non-slip rug tape. If you need to sit down while you are in the shower, use a non-slip stool. Install grab bars  by the toilet and in the bathtub and shower. Do not use towel bars as grab bars. What can I do in the bedroom? Make sure that you have a light by your bed that is easy to reach. Do not use any sheets or blankets on your bed that hang to the floor. Have a firm bench or chair with side arms that you can use for support when you get dressed. What can I do in the kitchen? Clean up any spills right away. If you need to reach something above you, use a sturdy step stool that has a grab bar. Keep electrical cables out of the way. Do not use floor polish or wax that makes floors slippery. What can I do with my stairs? Do not leave anything on the stairs. Make sure that you have a light switch at the top and the bottom of the stairs. Have them installed if you do not have them. Make sure that there are handrails on both sides of the stairs. Fix handrails that are broken or loose. Make sure that handrails are as long as the staircases. Install non-slip stair treads on all stairs in your home if they do not have carpet. Avoid having throw rugs at the top or bottom of stairs, or secure the rugs with carpet tape to prevent them from moving. Choose a Corporate treasurer that  does not hide the edge of steps on the stairs. Make sure that carpet is firmly attached to the stairs. Fix any carpet that is loose or worn. What can I do on the outside of my home? Use bright outdoor lighting. Repair the edges of walkways and driveways and fix any cracks. Clear paths of anything that can make you trip, such as tools or rocks. Add color or contrast paint or tape to clearly mark and help you see high doorway thresholds. Trim any bushes or trees on the main path into your home. Check that handrails are securely fastened and in good repair. Both sides of all steps should have handrails. Install guardrails along the edges of any raised decks or porches. Have leaves, snow, and ice cleared regularly. Use sand, salt, or ice melt on  walkways during winter months if you live where there is ice and snow. In the garage, clean up any spills right away, including grease or oil spills. What other actions can I take? Review your medicines with your health care provider. Some medicines can make you confused or feel dizzy. This can increase your chance of falling. Wear closed-toe shoes that fit well and support your feet. Wear shoes that have rubber soles and low heels. Use a cane, walker, scooter, or crutches that help you move around if needed. Talk with your provider about other ways that you can decrease your risk of falls. This may include seeing a physical therapist to learn to do exercises to improve movement and strength. Where to find more information Centers for Disease Control and Prevention, STEADI: TonerPromos.no [employer] on Aging: BaseRingTones.pl National Institute on Aging: BaseRingTones.pl Contact a health care provider if: You are afraid of falling at home. You feel weak, drowsy, or dizzy at home. You fall at home. Get help right away if you: Lose consciousness or have trouble moving after a fall. Have a fall that causes a head injury. These symptoms may be an emergency. Get help right away. Call 911. Do not wait to see if the symptoms will go away. Do not drive yourself to the hospital. This information is not intended to replace advice given to you by your health care provider. Make sure you discuss any questions you have with your health care provider. Document Revised: 03/01/2022 Document Reviewed: 03/01/2022 Elsevier Patient Education  2024 [employer].

## 2024-04-18 NOTE — Assessment & Plan Note (Signed)
 Prediabetes: Last A1c 6.3.  No change HTN: BP looks good, recommend to check from time to time, continue metoprolol  High cholesterol: On atorvastatin , last LDL 56 Dementia: On Aricept  and Namenda , things are stable, able to feed himself and change clothes. Behavioral symptoms are controlled with Seroquel  and Zoloft . Depression?  PHQ-9 score at 11, slightly high but there is no evidence of depression on exam today and by the son's report. B12 deficiency: Has not started supplements, we agreed on the following: B12 shot today and send a prescription for B12 as it can be placed on his pill pack. Gait disorder: No recent major falls but steadiness is not as good as before, patient declined to use a cane.  We agreed to proceed with a home PT referral to prevent a fall.  Also preventive information provided, see AVS Leg edema: Observation O2 sat: 94%, no symptoms, no shortness of breath on cough.  Observation. Vaccine advised: He is reluctant to take any vaccines.  I do recommend the following --Tdap flu shot COVID booster RSV Shingrix RTC 4 months

## 2024-04-18 NOTE — Progress Notes (Signed)
 Subjective:    Patient ID: Nicholas Burgess, male    DOB: October 09, 1940, 83 y.o.   MRN: [id number]  DOS:  04/18/2024 ROV, here with his son  Chronic medical problems addressed. Things are stable. Not taking B12 supplements O2 sat noted to be slightly low but he denies cough or difficulty breathing. No major falls but the gait is a little less steady than before. PHQ-9 11   Review of Systems See above   Past Medical History:  Diagnosis Date   Diverticulosis of colon    Erectile dysfunction due to arterial insufficiency    Hematospermia    Hx of colonic polyps 2007   Hyperlipidemia    Hypertension    Syncope 2002    Past Surgical History:  Procedure Laterality Date   APPENDECTOMY  1992   CATARACT EXTRACTION Bilateral 06/13/2017   COLONOSCOPY W/ POLYPECTOMY  2007 & 2012   Dr.Perry ; hyperplastic   INGUINAL HERNIA REPAIR  2009   bilateral   MANDIBLE FRACTURE SURGERY  1964    Current Outpatient Medications  Medication Instructions   atorvastatin  (LIPITOR) 40 mg, Oral, Daily at bedtime   donepezil  (ARICEPT ) 10 mg, Oral, Daily at bedtime   finasteride  (PROSCAR ) 5 mg, Oral, Daily   memantine  (NAMENDA ) 5 mg, Oral, 2 times daily   metoprolol  succinate (TOPROL -XL) 150 mg, Oral, Daily   QUEtiapine  (SEROQUEL ) 25 mg, Oral, Daily at bedtime   sertraline  (ZOLOFT ) 25 mg, Oral, Daily   tamsulosin (FLOMAX) 0.4 mg, Daily at bedtime       Objective:   Physical Exam BP 126/66   Pulse (!) 57   Temp 98 F (36.7 C) (Oral)   Resp 16   Ht 6' 2 (1.88 m)   Wt 270 lb 4 oz (122.6 kg)   SpO2 93%   BMI 34.70 kg/m  General:   Well developed, NAD, BMI noted. HEENT:  Normocephalic . Face symmetric, atraumatic Lungs:  CTA B Normal respiratory effort, no intercostal retractions, no accessory muscle use. Heart: RRR,  no murmur.  Lower extremities: +/+++ pretibial edema bilaterally  Skin: Not pale. Not jaundice Neurologic:  alert & pleasantly demented, follow commands Speech normal,  gait appropriate for age and unassisted Psych--  Behavior appropriate. No anxious or depressed appearing.      Assessment     Assessment  Prediabetes HTN dc amlodipine  05-2015 dt edema Leg edema, worse on the left, neg US  for  DVT 01/2018 Stasis dermatitis, see OV 10/06/2022 Hyperlipidemia Urology -Hematospermia 1 ( 12-2014) -- saw urology 09-2015, RTC prn -BPH, LUTS, urinary retention status post Foley catheter 07-2021 Skin cancer Dementia: [facility] MMSE 21 (10-2019), rx aricept  ; MMSE 2023: 20 B12 deficiency  PLAN Prediabetes: Last A1c 6.3.  No change HTN: BP looks good, recommend to check from time to time, continue metoprolol  High cholesterol: On atorvastatin , last LDL 56 Dementia: On Aricept  and Namenda , things are stable, able to feed himself and change clothes. Behavioral symptoms are controlled with Seroquel  and Zoloft . Depression?  PHQ-9 score at 11, slightly high but there is no evidence of depression on exam today and by the son's report. B12 deficiency: Has not started supplements, we agreed on the following: B12 shot today and send a prescription for B12 as it can be placed on his pill pack. Gait disorder: No recent major falls but steadiness is not as good as before, patient declined to use a cane.  We agreed to proceed with a home PT referral to prevent a fall.  Also preventive information provided, see AVS Leg edema: Observation O2 sat: 94%, no symptoms, no shortness of breath on cough.  Observation. Vaccine advised: He is reluctant to take any vaccines.  I do recommend the following --Tdap flu shot COVID booster RSV Shingrix RTC 4 months

## 2024-05-02 ENCOUNTER — Telehealth: Payer: Self-pay

## 2024-05-02 NOTE — Progress Notes (Signed)
 Pharmacy Quality Measure Review  This patient is appearing on a report for being at risk of failing the adherence measure for cholesterol (statin) medications this calendar year.   Medication: atorvastatin  40 mg  Last fill date: 04/05/24 for 28 day supply  Unfortunately, unable to reach patient or leave a voicemail. Refill for atorvastatin  due on 10/23. One refill remaining.  Woodie Jock, PharmD PGY1 Pharmacy Resident  05/02/2024

## 2024-06-05 ENCOUNTER — Other Ambulatory Visit: Payer: Self-pay

## 2024-06-05 DIAGNOSIS — G309 Alzheimer's disease, unspecified: Secondary | ICD-10-CM

## 2024-06-05 DIAGNOSIS — R269 Unspecified abnormalities of gait and mobility: Secondary | ICD-10-CM

## 2024-08-13 ENCOUNTER — Other Ambulatory Visit: Payer: Self-pay | Admitting: Internal Medicine

## 2024-08-20 ENCOUNTER — Ambulatory Visit: Admitting: Internal Medicine

## 2025-01-02 ENCOUNTER — Ambulatory Visit
